# Patient Record
Sex: Male | Born: 2000
Health system: Southern US, Community
[De-identification: ages and names within clinical notes are randomized; demographics above are authoritative.]

## PROBLEM LIST (undated history)

## (undated) DIAGNOSIS — T7840XA Allergy, unspecified, initial encounter: Secondary | ICD-10-CM

## (undated) DIAGNOSIS — F909 Attention-deficit hyperactivity disorder, unspecified type: Secondary | ICD-10-CM

## (undated) HISTORY — PX: TONSILLECTOMY AND ADENOIDECTOMY: SUR1326

## (undated) HISTORY — DX: Attention-deficit hyperactivity disorder, unspecified type: F90.9

## (undated) HISTORY — DX: Allergy, unspecified, initial encounter: T78.40XA

---
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
## 2020-01-02 NOTE — Telephone Encounter (Signed)
Patient has not been seen by me since July 2020. Please get him scheduled for follow-up. I will not refill until he follows up.

## 2020-02-12 NOTE — Telephone Encounter (Signed)
I scheduled patient for a virtual visit for his adderall next week.  Naamah Boggess,cma

## 2020-02-16 ENCOUNTER — Telehealth: Payer: Self-pay | Admitting: Family Medicine

## 2020-02-16 DIAGNOSIS — Z0289 Encounter for other administrative examinations: Secondary | ICD-10-CM

## 2020-05-09 ENCOUNTER — Encounter: Payer: Self-pay | Admitting: Family Medicine

## 2020-05-09 ENCOUNTER — Telehealth: Payer: Self-pay | Admitting: Family Medicine

## 2020-07-11 ENCOUNTER — Encounter: Payer: Self-pay | Admitting: Family Medicine

## 2020-07-11 DIAGNOSIS — F419 Anxiety disorder, unspecified: Secondary | ICD-10-CM

## 2020-07-22 ENCOUNTER — Other Ambulatory Visit: Payer: Self-pay | Admitting: Family Medicine

## 2020-07-22 ENCOUNTER — Other Ambulatory Visit: Payer: Self-pay

## 2020-07-22 ENCOUNTER — Ambulatory Visit (INDEPENDENT_AMBULATORY_CARE_PROVIDER_SITE_OTHER): Payer: No Typology Code available for payment source | Admitting: Family Medicine

## 2020-07-22 ENCOUNTER — Encounter: Payer: Self-pay | Admitting: Family Medicine

## 2020-07-22 DIAGNOSIS — F419 Anxiety disorder, unspecified: Secondary | ICD-10-CM | POA: Diagnosis not present

## 2020-07-22 DIAGNOSIS — F329 Major depressive disorder, single episode, unspecified: Secondary | ICD-10-CM

## 2020-07-22 DIAGNOSIS — F988 Other specified behavioral and emotional disorders with onset usually occurring in childhood and adolescence: Secondary | ICD-10-CM | POA: Diagnosis not present

## 2020-07-22 DIAGNOSIS — F32A Depression, unspecified: Secondary | ICD-10-CM

## 2020-07-22 MED ORDER — HYDROXYZINE HCL 10 MG PO TABS: 10.0000 mg | ORAL_TABLET | Freq: Two times a day (BID) | ORAL | 0 refills | Status: DC | PRN

## 2020-07-22 MED ORDER — ESCITALOPRAM OXALATE 10 MG PO TABS: 10.0000 mg | ORAL_TABLET | Freq: Every day | ORAL | 1 refills | Status: DC

## 2020-07-22 NOTE — Progress Notes (Addendum)
Marikay Alar, MD Phone: [phone]  Christian Bond is a 19 y.o. male who presents today for f/u.  Anxiety/depression: Patient notes his anxiety has worsened recently.  Last year this was triggered by going off to school and notes school seemed to trigger it to a certain degree this year though he feels as though it is now where it is just part of his life.  He is currently not on any medications for this though was seeing a psychiatrist previously who had increased his Lexapro to 20 mg.  He did taper off of that earlier this year.  He does note some depression that has been going on intermittently for the last couple of years.  He notes no suicidal ideation though he does think about cutting and does oftentimes bite his lip.  He notes he has not cut himself and notes he does not think he actually would.  He does note some panic attacks where he feels like his heart speeds up when he feels paranoid and finds it harder to breathe.  These can last anywhere from minutes to 30 minutes.  They just stop on their own typically.  He was previously using Atarax which was somewhat beneficial for those panic attacks.  He notes he has stopped using marijuana as that was making the paranoia worse.  No alcohol intake.  He is not sexually active.  He does not exercise.  ADHD: Adderall was previously beneficial.  He wants to hold off on starting back on this until after he goes back on the Lexapro.  Social History   Tobacco Use  Smoking Status Never Smoker  Smokeless Tobacco Never Used     ROS see history of present illness  Objective  Physical Exam Vitals:   07/22/20 1533  BP: 100/60  Pulse: 85  Temp: 99.2 F (37.3 C)  SpO2: 98%    BP Readings from Last 3 Encounters:  07/22/20 100/60  11/20/18 (!) 98/60 (2 %, Z = -2.02 /  16 %, Z = -0.98)*  08/22/18 (!) 98/62 (3 %, Z = -1.92 /  23 %, Z = -0.74)*   *BP percentiles are based on the 2017 AAP Clinical Practice Guideline for boys   Wt  Readings from Last 3 Encounters:  07/22/20 134 lb 12.8 oz (61.1 kg) (19 %, Z= -0.86)*  06/22/19 130 lb (59 kg) (18 %, Z= -0.91)*  11/20/18 130 lb 9.6 oz (59.2 kg) (23 %, Z= -0.73)*   * Growth percentiles are based on CDC (Boys, 2-20 Years) data.    Physical Exam Constitutional:      General: He is not in acute distress.    Appearance: He is not diaphoretic.  Cardiovascular:     Rate and Rhythm: Normal rate and regular rhythm.     Heart sounds: Normal heart sounds.  Pulmonary:     Effort: Pulmonary effort is normal.     Breath sounds: Normal breath sounds.  Skin:    General: Skin is warm and dry.  Neurological:     Mental Status: He is alert.  Psychiatric:     Comments: Mood anxious, depressed, affect flat, does not keep eye contact consistently, converses well      Assessment/Plan: Please see individual problem list.  Anxiety and depression Seems to have worsened recently.  Has anxiety and depression.  It appears that the psychiatry office is try to contact him.  He was given the number to call to schedule an appointment.  We will restart Lexapro.  He can use Atarax as needed.  Discussed the biting of his lip and the thoughts of cutting are not uncommon actions to deal with now he is feeling though there are healthier ways to deal with this with medication and therapy.  We will make sure he is evaluated by psychiatry.  Advised that if he ever develop suicidal thoughts he needs to be evaluated in the emergency department right away.  ADD (attention deficit disorder) Patient defers restarting treatment at this time as we start him back on Lexapro and Atarax.  He can let us know if you would like to start back on Adderall at some point in the future.   Health Maintenance: patient defers HIV and hepatitis C screening. Defers flu vaccine today.   No orders of the defined types were placed in this encounter.   Meds ordered this encounter  Medications   escitalopram (LEXAPRO) 10  MG tablet    Sig: Take 1 tablet (10 mg total) by mouth daily.    Dispense:  90 tablet    Refill:  1   hydrOXYzine (ATARAX/VISTARIL) 10 MG tablet    Sig: Take 1 tablet (10 mg total) by mouth 2 (two) times daily as needed for anxiety.    Dispense:  20 tablet    Refill:  0    This visit occurred during the SARS-CoV-2 public health emergency.  Safety protocols were in place, including screening questions prior to the visit, additional usage of staff PPE, and extensive cleaning of exam room while observing appropriate contact time as indicated for disinfecting solutions.    Marikay Alar, MD [facility]

## 2020-07-22 NOTE — Patient Instructions (Addendum)
Nice to see you. We will restart Lexapro.  You can restart Atarax on an as-needed basis as well.  Please be careful as this may make you drowsy. It looks like your referral was sent to the psychiatrist office.  They may have mailed you paperwork to complete.  Hear is the number to call them to work on scheduling an appointment. [phone] If you develop thoughts of suicide or plan or intent to commit suicide please seek medical attention in the emergency department immediately.

## 2020-07-22 NOTE — Assessment & Plan Note (Addendum)
Seems to have worsened recently.  Has anxiety and depression.  It appears that the psychiatry office is try to contact him.  He was given the number to call to schedule an appointment.  We will restart Lexapro.  He can use Atarax as needed.  Discussed the biting of his lip and the thoughts of cutting are not uncommon actions to deal with now he is feeling though there are healthier ways to deal with this with medication and therapy.  We will make sure he is evaluated by psychiatry.  Advised that if he ever develop suicidal thoughts he needs to be evaluated in the emergency department right away.

## 2020-07-22 NOTE — Assessment & Plan Note (Signed)
Patient defers restarting treatment at this time as we start him back on Lexapro and Atarax.  He can let us know if you would like to start back on Adderall at some point in the future.

## 2020-07-28 ENCOUNTER — Encounter: Payer: Self-pay | Admitting: Family Medicine

## 2020-08-25 ENCOUNTER — Other Ambulatory Visit: Payer: Self-pay

## 2020-08-25 ENCOUNTER — Ambulatory Visit (INDEPENDENT_AMBULATORY_CARE_PROVIDER_SITE_OTHER): Payer: No Typology Code available for payment source | Admitting: Child and Adolescent Psychiatry

## 2020-08-25 ENCOUNTER — Encounter: Payer: Self-pay | Admitting: Child and Adolescent Psychiatry

## 2020-08-25 ENCOUNTER — Other Ambulatory Visit: Payer: Self-pay | Admitting: Child and Adolescent Psychiatry

## 2020-08-25 ENCOUNTER — Telehealth: Payer: Self-pay | Admitting: Child and Adolescent Psychiatry

## 2020-08-25 DIAGNOSIS — F418 Other specified anxiety disorders: Secondary | ICD-10-CM | POA: Diagnosis not present

## 2020-08-25 DIAGNOSIS — F902 Attention-deficit hyperactivity disorder, combined type: Secondary | ICD-10-CM | POA: Diagnosis not present

## 2020-08-25 DIAGNOSIS — F422 Mixed obsessional thoughts and acts: Secondary | ICD-10-CM | POA: Diagnosis not present

## 2020-08-25 DIAGNOSIS — F331 Major depressive disorder, recurrent, moderate: Secondary | ICD-10-CM | POA: Diagnosis not present

## 2020-08-25 DIAGNOSIS — F419 Anxiety disorder, unspecified: Secondary | ICD-10-CM

## 2020-08-25 DIAGNOSIS — F32A Depression, unspecified: Secondary | ICD-10-CM

## 2020-08-25 DIAGNOSIS — F329 Major depressive disorder, single episode, unspecified: Secondary | ICD-10-CM

## 2020-08-25 MED ORDER — HYDROXYZINE HCL 25 MG PO TABS: ORAL_TABLET | ORAL | 0 refills | Status: DC

## 2020-08-25 MED ORDER — SERTRALINE HCL 25 MG PO TABS: 25.0000 mg | ORAL_TABLET | Freq: Every day | ORAL | 0 refills | Status: DC

## 2020-08-25 NOTE — Telephone Encounter (Signed)
  Collateral information from father - Hi Dr. Jerold Coombe. I just wanted to touch base with you as you will be seeing my son Christian Bond tomorrow. I hope he is honest and forthcoming with you, but he is very reserved. His is seeing you for anxiety and panic attacks, but his underlying issues are much deeper. I suspect he has OCD primarily in the form of obsessive thoughts. He went through periods of obsessive hand washing off and on throughout middle school. By the time he got to high school he was constantly distracted, lost in thought, often pacing around his room or the house oblivous to his surroundings. He was treated for ADD, but I now think he was distracted by obsessive thoughts. It's much worse when he is by himself, so he tends to require constant companionship with familiar people. He started college at [employer] last year, but dropped after a month. When he came home he told me he thought he might have schizophrenia, but wouldn't elaborate. I suspect he was interpreting intrusive thoughts as being voices, but i really don't know. He tried going back to school at [employer] in August, but dropped after 3 weeks, apparently due to anxiety and panic attacks. He can also be somewhat paranoid, so he would probably be upset with me for talking to you, but I just want to make sure you're aware that there is more going on than he may volunteer. I fully understand the privacy policies so I don't expect a response. Thank you!

## 2020-08-25 NOTE — Progress Notes (Addendum)
Virtual Visit via Video Note  I connected with Christian Bond on 08/25/20 at 11:00 AM EDT by a video enabled telemedicine application and verified that I am speaking with the correct person using two identifiers.  Location: Patient: home Provider: office   I discussed the limitations of evaluation and management by telemedicine and the availability of in person appointments. The patient expressed understanding and agreed to proceed.  I discussed the assessment and treatment plan with the patient. The patient was provided an opportunity to ask questions and all were answered. The patient agreed with the plan and demonstrated an understanding of the instructions.   The patient was advised to call back or seek an in-person evaluation if the symptoms worsen or if the condition fails to improve as anticipated.  I provided 60 minutes of non-face-to-face time during this encounter.   Darcel SmallingHiren M Greggory Safranek, MD     Psychiatric Initial Adult Assessment   Patient Identification: Christian Bond MRN:  [id number] Date of Evaluation:  08/25/2020 Referral Source: Marikay AlarEric Sonnenberg, M.D. Chief Complaint:   Visit Diagnosis:    ICD-10-CM   1. Moderate episode of recurrent major depressive disorder (HCC)  F33.1 sertraline (ZOLOFT) 25 MG tablet  2. Other specified anxiety disorders  F41.8 sertraline (ZOLOFT) 25 MG tablet  3. Attention deficit hyperactivity disorder (ADHD), combined type  F90.2   4. Mixed obsessional thoughts and acts  F42.2 sertraline (ZOLOFT) 25 MG tablet  5. Anxiety and depression  F41.9 hydrOXYzine (ATARAX/VISTARIL) 25 MG tablet   F32.9     History of Present Illness: This is a 19 year old Caucasian male, domiciled with biological parents and 19 year old brother, was attending [employer] for this fall semester but dropped out about 3 weeks ago,  He has psychiatric history significant of anxiety, OCD, ADHD, depression and 1 previous psychiatric visit to behavioral health urgent care in  2020.  He was seen and evaluated today for telemedicine encounter.  He reports that he has seen psychiatrist before and his primary care physician recently started him on Lexapro 10 mg for anxiety.  He reports that his anxiety has been worsening, continues to have "compulsive thoughts" and his parents are concerned because he has been staying in his room a lot and sleeping therefore he made this appointment.  During the evaluation he appeared very anxious especially when asked about his anxiety and OCD, he was often distracted and required repetition of question to get a response. After few minutes in the interview he did not want to continue and said that his PCP can manage his medications, however with reiterating the objectives of the appointment he was able to stay in the appointment.   Anxiety -he reports anxiety since middle school.  His anxiety symptoms include excessive worries, or thinking about past and future, catastrophic thinking, panic attacks occurring about every other day and lasting up to 30 minutes, and anxiety in social situations.  He reports that he thinks his anxiety has increased since being on Lexapro.  He reported that he is currently taking Lexapro 20 mg once a day and has been taking it since last over a month.  Depression - He reports that he has intermittent episode of depression that may last of one week.  During these episodes he reports that he has depressed mood, anhedonia, lack of energy, lack of sleep, lack of appetite.  He reports that he has history of suicidal thoughts occurring intermittently irrespective of depressive episodes and are passive in nature.  He denies any previous  suicide attempt.  He reports that he has history of thoughts of plan to act on his suicidal ideation crossing his mind but never had any deliberate plan or intention to act on these thoughts.  He reports that in the past he has engaged in self-harm behaviors which he describes as extinguishing  cigarette but on his arm over the span of 2 weeks.  He reports that his last suicidal thoughts was about 2 weeks ago and denies any current suicidal thoughts, intention or plan.  He reports history of depression over the last few years.  OCD -he reports that he has compulsive rituals which includes counting by seconds, checking rituals, and washing rituals.  He also reports that he has ego-dystonic and intrusive self-harm thoughts.  He reports history of OCD since the middle school.   ADHD -he reports that he has been diagnosed with ADHD and previously taken stimulant medications.  According to chart review he was last taking Adderall XR.  He reports that he has problems with paying attention, gets easily distracted and Adderall was helpful however when he decided to go back on anxiety medications he decided to hold off on ADHD medications.  Paranoia - He reports that he gets paranoid when he goes outside. He reports that often he does not believe that others are out there to get him and describes it as a "general fear" but sometimes these feelings are stronger. He denies any AVH and did not admit other delusions.   He denies symptoms consistent with manic or hypomanic episodes currently or in the past.    Past Psychiatric History:   Inpatient: None RTC: None Outpatient: Previously seen Dr. Jannifer Franklin    - Meds: Lexapro, states "I don't know if it is working and wants to switch to Zoloft or Buspar. He denies other medications trials, however chart review suggested he tried zoloft up to 50 mg daily. Also has hx of trialing Ritalin and Adderall for ADHD. Uses Atarax PRN for anxiety.   He reports that on Lexapro he is more impulsive  - Therapy: In the past about a year ago, none recently.   Previous Psychotropic Medications: Yes   Substance Abuse History in the last 12 months:  Yes.  Hx of MJA abuse.   Consequences of Substance Abuse: NA  Past Medical History:  Past Medical History:   Diagnosis Date  . ADHD (attention deficit hyperactivity disorder)   . Allergy     Past Surgical History:  Procedure Laterality Date  . TONSILLECTOMY AND ADENOIDECTOMY      Family Psychiatric History:   Maternal GM - Mental health problems but he is not sure of it.    Family History:  Family History  Problem Relation Age of Onset  . Colon polyps Mother   . Stroke Maternal Uncle   . Heart disease Maternal Grandfather   . Hyperlipidemia Maternal Grandfather   . Stroke Maternal Grandfather     Social History:   Social History   Socioeconomic History  . Marital status: Single    Spouse name: Not on file  . Number of children: Not on file  . Years of education: Not on file  . Highest education level: Not on file  Occupational History  . Not on file  Tobacco Use  . Smoking status: Never Smoker  . Smokeless tobacco: Never Used  Vaping Use  . Vaping Use: Some days  Substance and Sexual Activity  . Alcohol use: No  . Drug use: No  .  Sexual activity: Not on file  Other Topics Concern  . Not on file  Social History Narrative  . Not on file   Social Determinants of Health   Financial Resource Strain:   . Difficulty of Paying Living Expenses: Not on file  Food Insecurity:   . Worried About Programme researcher, broadcasting/film/video in the Last Year: Not on file  . Ran Out of Food in the Last Year: Not on file  Transportation Needs:   . Lack of Transportation (Medical): Not on file  . Lack of Transportation (Non-Medical): Not on file  Physical Activity:   . Days of Exercise per Week: Not on file  . Minutes of Exercise per Session: Not on file  Stress:   . Feeling of Stress : Not on file  Social Connections:   . Frequency of Communication with Friends and Family: Not on file  . Frequency of Social Gatherings with Friends and Family: Not on file  . Attends Religious Services: Not on file  . Active Member of Clubs or Organizations: Not on file  . Attends Banker Meetings:  Not on file  . Marital Status: Not on file    Additional Social History:   Patient is currently domiciled with biological parents, 53 year old brother.  He completed his high school and went to [employer] last year and dropped out after 4 weeks of attending college.  He started [employer] this fall semester however dropped out after 3 weeks because of anxiety.  His hobbies include watching TV, listening to music, pets(has 2 parakeets in his home)     Allergies:   Allergies  Allergen Reactions  . Amoxicillin Rash    Erythema multiforma  . Penicillins Other (See Comments)    Blisters.    Metabolic Disorder Labs: No results found for: HGBA1C, MPG No results found for: PROLACTIN No results found for: CHOL, TRIG, HDL, CHOLHDL, VLDL, LDLCALC No results found for: TSH  Therapeutic Level Labs: No results found for: LITHIUM No results found for: CBMZ No results found for: VALPROATE  Current Medications: Current Outpatient Medications  Medication Sig Dispense Refill  . fexofenadine (ALLEGRA) 180 MG tablet Take 180 mg by mouth daily.    . hydrOXYzine (ATARAX/VISTARIL) 25 MG tablet Take 0.5-1 tablets(12.5-25 mg total) by mouthe 3(three) times daily as needed for anxiety, and 1 tablet(25 mg total) at bedtime as needed for sleeping difficulties. 30 tablet 0  . [START ON 08/30/2020] sertraline (ZOLOFT) 25 MG tablet Take 1 tablet (25 mg total) by mouth daily. 30 tablet 0   No current facility-administered medications for this visit.    Musculoskeletal: Strength & Muscle Tone: unable to assess since visit was over the telemedicine. Gait & Station: unable to assess since visit was over the telemedicine. Patient leans: N/A  Psychiatric Specialty Exam: Review of Systems  There were no vitals taken for this visit.There is no height or weight on file to calculate BMI.  General Appearance: Casual, Fairly Groomed and shaved head and wearing cap.   Eye Contact:  Poor  Speech:   Clear and Coherent and Normal Rate  Volume:  Normal  Mood:  Anxious  Affect:  Appropriate, Congruent, Restricted and anxious  Thought Process:  Goal Directed and Linear  Orientation:  Full (Time, Place, and Person)  Thought Content:  Logical  Suicidal Thoughts:  No  Homicidal Thoughts:  No  Memory:  Immediate;   Fair Recent;   Fair Remote;   Fair  Judgement:  Fair  Insight:  Shallow  Psychomotor Activity:  Normal  Concentration:  Concentration: Fair and Attention Span: Fair  Recall:  [employer] of Knowledge:Fair  Language: Fair  Akathisia:  No    AIMS (if indicated):  not done  Assets:  Communication Skills Desire for Improvement Financial Resources/Insurance Housing Leisure Time Physical Health Social Support Transportation Vocational/Educational  ADL's:  Intact  Cognition: WNL  Sleep:  Fair   Screenings: PHQ2-9     Office Visit from 07/22/2020 in [facility] [place] Office Visit from 06/22/2019 in [facility]   PHQ-2 Total Score 0 0      Assessment and Plan:   19 year old male with prior psychiatric history Anxiety, OCD, Depression, ADHD now presenting to establish outpatient psychiatric care due to lack of improvement/worsening of anxiety, OCD, Depression and ADHD. His reports of symptoms is most likely consistent with Generalized and Social Anxiety disorder with panic attack, OCD, Recurrent MDD and ADHD. He does report paranoid ideations, this does not appear delusions and he denies other psychotic symptoms. He also denies symptoms consistent with manic/hypomanic episode. Anxiety and OCD appears to be at core of his presentation which appear to impact his daily functioning and resulting in recurrent depression. He has trialed Lexapro upto 20 mg in the past with no improvement and Zoloft upto 50 mg daily. I recommended cross taper from Lexapro to Zoloft since he only tried zoloft upto 50 mg daily and may need higher dose for the appropriate  response. He reported that he is taking LExapro 20 mg daily, however further chart review after appointment revealed he is prescribed Lexapro 10 mg daily by his PCP. He was nonetheless asked to cut his current lexapro tablet in half for 5 days and 1/4 for another five days before stopping it and start Zoloft at 25 mg daily in 5 days from now. I anticipate that he will require higher dose of Zoloft and quick titration up.   Plan:   - Decrease Lexapro to 5 mg daily x 5 days and then 2.5 mg daily for another 5 days and stop.  - Start Zoloft 25 mg daily in 5 days from today.  - Side effects including but not limited to nausea, vomiting, diarrhea, constipation, headaches, dizziness, black box warning of suicidal thoughts with SSRI were discussed with pt and parents. Mother provided informed consent.  - Recommended ind therapy, and made a referral to [facility]. Front desk to call and make an appointment. - Start Atarax 12.5-25 mg TID PRN for anxiety/panic attacks and QHS PRN for sleep   A suicide and violence risk assessment was performed as part of this evaluation. The patient is deemed to be at chronic elevated risk for self-harm/suicide given the following factors: current diagnosis of Recurrent MDD, Anxiety Disorders, OCD, and past hx of suicidal ideations and self harm behaviors. The patient is deemed to be at chronic elevated risk for violence given the following factors: younger age and past hx of MJa use. These risk factors are mitigated by the following factors:lack of active SI/HI, no history of previous suicide attempts , no history of violence, motivation for treatment, utilization of positive coping skills, supportive family, presence of an available support system, enjoyment of leisure actvities, current treatment compliance, safe housing and support system in agreement with treatment recommendations. There is no acute risk for suicide or violence at this time. The patient was educated about relevant  modifiable risk factors including following recommendations for treatment of psychiatric illness and abstaining from substance abuse.  While future psychiatric events cannot be accurately predicted, the patient does not request acute inpatient psychiatric care and does not currently meet [facility] involuntary commitment criteria.    Total time spent of date of service was 75 minutes.  Patient care activities included preparing to see the patient such as reviewing the patient's record, obtaining history from parent, performing a medically appropriate history and mental status examination, counseling and educating the patient, on diagnosis, treatment plan, medications, medications side effects, ordering prescription medications, documenting clinical information in the electronic for other health record, medication side effects. and coordinating the care of the patient when not separately reported.  This note was generated in part or whole with voice recognition software. Voice recognition is usually quite accurate but there are transcription errors that can and very often do occur. I apologize for any typographical errors that were not detected and corrected.    [name], MD 9/30/20217:13 PM

## 2020-08-29 ENCOUNTER — Encounter: Payer: Self-pay | Admitting: Family Medicine

## 2020-09-02 ENCOUNTER — Other Ambulatory Visit: Payer: Self-pay

## 2020-09-02 ENCOUNTER — Ambulatory Visit (INDEPENDENT_AMBULATORY_CARE_PROVIDER_SITE_OTHER): Payer: No Typology Code available for payment source | Admitting: Family Medicine

## 2020-09-02 ENCOUNTER — Encounter: Payer: Self-pay | Admitting: Family Medicine

## 2020-09-02 DIAGNOSIS — F418 Other specified anxiety disorders: Secondary | ICD-10-CM

## 2020-09-02 NOTE — Progress Notes (Signed)
  Marikay Alar, MD Phone: [phone]  Christian Bond is a 19 y.o. male who presents today for f/u.  Anxiety/depression/OCD: Patient saw psychiatry recently.  They diagnosed him with anxiety, MDD, OCD, and noted ADHD as well.  They tapered him off of the Lexapro as the patient felt like this was making his anxiety worse.  They started him on Zoloft 25 mg cross tapering with Lexapro.  He notes already feeling some benefit.  He feels less like he needs to pace around.  Depression seems to have improved some as well.  He does report psychiatry follow-up scheduled.  He notes he has deferred doing therapy at this time.  He denies SI.  Social History   Tobacco Use  Smoking Status Never Smoker  Smokeless Tobacco Never Used     ROS see history of present illness  Objective  Physical Exam Vitals:   09/02/20 1123  BP: 90/60  Pulse: 88  Temp: 98.6 F (37 C)  SpO2: 98%    BP Readings from Last 3 Encounters:  09/02/20 90/60  07/22/20 100/60  11/20/18 (!) 98/60 (2 %, Z = -2.02 /  16 %, Z = -0.98)*   *BP percentiles are based on the 2017 AAP Clinical Practice Guideline for boys   Wt Readings from Last 3 Encounters:  09/02/20 131 lb 9.6 oz (59.7 kg) (15 %, Z= -1.05)*  07/22/20 134 lb 12.8 oz (61.1 kg) (19 %, Z= -0.86)*  06/22/19 130 lb (59 kg) (18 %, Z= -0.91)*   * Growth percentiles are based on CDC (Boys, 2-20 Years) data.    Physical Exam Constitutional:      General: He is not in acute distress.    Appearance: He is not diaphoretic.  Cardiovascular:     Rate and Rhythm: Normal rate and regular rhythm.     Heart sounds: Normal heart sounds.  Pulmonary:     Effort: Pulmonary effort is normal.     Breath sounds: Normal breath sounds.  Skin:    General: Skin is warm and dry.  Neurological:     Mental Status: He is alert.      Assessment/Plan: Please see individual problem list.  Problem List Items Addressed This Visit    Other specified anxiety disorders    Has  reported improving anxiety.  I suspect this is more likely related to tapering off of the Lexapro then it is to starting on the Zoloft as its only been a couple of days.  I discussed that he will likely notice more benefit with the Zoloft with more time.  Discussed it could take several more weeks or a month to notice significant benefit.  He will continue Zoloft 25 mg once daily.  He can continue hydroxyzine as prescribed by psychiatry.  He will follow up with psychiatry as scheduled.  He will see me in 2 months.        Of note patient recently shaved his head.  I encouraged him to wear sunscreen or hat for skin protection.  This visit occurred during the SARS-CoV-2 public health emergency.  Safety protocols were in place, including screening questions prior to the visit, additional usage of staff PPE, and extensive cleaning of exam room while observing appropriate contact time as indicated for disinfecting solutions.    Marikay Alar, MD [facility]

## 2020-09-02 NOTE — Assessment & Plan Note (Signed)
Has reported improving anxiety.  I suspect this is more likely related to tapering off of the Lexapro then it is to starting on the Zoloft as its only been a couple of days.  I discussed that he will likely notice more benefit with the Zoloft with more time.  Discussed it could take several more weeks or a month to notice significant benefit.  He will continue Zoloft 25 mg once daily.  He can continue hydroxyzine as prescribed by psychiatry.  He will follow up with psychiatry as scheduled.  He will see me in 2 months.

## 2020-09-02 NOTE — Patient Instructions (Signed)
Nice to see you. Please continue with the taper off of the Lexapro and continue with your Zoloft. If you have any worsening anxiety or depression symptoms please let us know or your psychiatrist know.  Please keep your psychiatry follow-up appointment.

## 2020-09-09 ENCOUNTER — Other Ambulatory Visit: Payer: Self-pay

## 2020-09-09 ENCOUNTER — Telehealth (INDEPENDENT_AMBULATORY_CARE_PROVIDER_SITE_OTHER): Payer: No Typology Code available for payment source | Admitting: Child and Adolescent Psychiatry

## 2020-09-09 DIAGNOSIS — F422 Mixed obsessional thoughts and acts: Secondary | ICD-10-CM | POA: Diagnosis not present

## 2020-09-09 DIAGNOSIS — F902 Attention-deficit hyperactivity disorder, combined type: Secondary | ICD-10-CM

## 2020-09-09 DIAGNOSIS — F418 Other specified anxiety disorders: Secondary | ICD-10-CM | POA: Diagnosis not present

## 2020-09-09 DIAGNOSIS — F330 Major depressive disorder, recurrent, mild: Secondary | ICD-10-CM

## 2020-09-09 MED ORDER — HYDROXYZINE HCL 25 MG PO TABS: ORAL_TABLET | ORAL | 0 refills | Status: DC

## 2020-09-09 MED ORDER — SERTRALINE HCL 50 MG PO TABS: 50.0000 mg | ORAL_TABLET | Freq: Every day | ORAL | 0 refills | Status: DC

## 2020-09-09 NOTE — Progress Notes (Signed)
Virtual Visit via Video Note  I connected with Christian Bond on 09/09/20 at 10:30 AM EDT by a video enabled telemedicine application and verified that I am speaking with the correct person using two identifiers.  Location: Patient: home Provider: office   I discussed the limitations of evaluation and management by telemedicine and the availability of in person appointments. The patient expressed understanding and agreed to proceed.   I discussed the assessment and treatment plan with the patient. The patient was provided an opportunity to ask questions and all were answered. The patient agreed with the plan and demonstrated an understanding of the instructions.   The patient was advised to call back or seek an in-person evaluation if the symptoms worsen or if the condition fails to improve as anticipated.  I provided 30 minutes of non-face-to-face time during this encounter.   [name], MD    [facility] MD/PA/NP OP Progress Note  09/09/2020 2:07 PM Christian Bond  MRN:  [id number]  Chief Complaint: Medication management follow-up for depression, anxiety, OCD, ADHD HPI: This is a 19 year old Caucasian male, domiciled with biological parents and 19 year old brother, was attending [employer] for this fall semester but dropped out after 3 weeks.  He was seen for initial evaluation about 2 weeks ago for anxiety, OCD, ADHD and depression.  He was recommended to cross taper from Lexapro to Zoloft at last appointment and presents today for follow-up.  During the evaluation he appeared distracted, slightly anxious, often had thought blocking and his attention span was poor.  He reports that he was successfully able to cross taper from Lexapro to Zoloft.  He reports that initially he had some difficulties with paying attention more than his baseline and had brief suicidal thoughts but that has resolved and he is tolerating Zoloft well.  He reports that he also has noticed significant improvement  with his anxiety from 8 out of 10 at the last appointment to 3-4 out of 10 at this appointment(10 = most anxious). He also reports improvement with his Obsessive thoughts and compulsive behaviors. He also notices improvement with his mood, denies any depressive episodes since the last appointment, rates his mood at 7 out of 10 (10 = happiest), enjoys taking care of his parakeets playing video games and hanging out with his friends.  He denies any recent suicidal thoughts or self-harm behaviors.  He denies any homicidal thoughts.  He reports that he has been sleeping better with hydroxyzine and sleeps about 7 hours at night.  He reports that he has been eating well.  Concentration has still been an issue and continues to have low energy. He reports that he uses MJA about twice a week and it sometimes calms and sometimes makes him paranoid. He denies any AVH and did not admit any delusions. He also smokes nicotine occasionally.   We discussed to increase zoloft to 50 mg once a day due to partial improvement with his mood, anxiety and OCD.  He verbalized understanding and agreed with the plan.  We also discussed to try medications for ADHD once his anxiety is more stable.  He was referred to [facility] for therapy however did not pick up the phone when office called him.  He is referred again today.  Recommended to follow-up with therapy.  Visit Diagnosis:    ICD-10-CM   1. Attention deficit hyperactivity disorder (ADHD), combined type  F90.2   2. Other specified anxiety disorders  F41.8 sertraline (ZOLOFT) 50 MG tablet  3.  Mixed obsessional thoughts and acts  F42.2 sertraline (ZOLOFT) 50 MG tablet  4. Mild episode of recurrent major depressive disorder (HCC)  F33.0 hydrOXYzine (ATARAX/VISTARIL) 25 MG tablet    Past Psychiatric History:  As mentioned in initial H&P, reviewed today, no change  Past Medical History:  Past Medical History:  Diagnosis Date   ADHD (attention deficit hyperactivity disorder)     Allergy     Past Surgical History:  Procedure Laterality Date   TONSILLECTOMY AND ADENOIDECTOMY      Family Psychiatric History: As mentioned in initial H&P, reviewed today, no change  Family History:  Family History  Problem Relation Age of Onset   Colon polyps Mother    Stroke Maternal Uncle    Heart disease Maternal Grandfather    Hyperlipidemia Maternal Grandfather    Stroke Maternal Grandfather     Social History:  Social History   Socioeconomic History   Marital status: Single    Spouse name: Not on file   Number of children: Not on file   Years of education: Not on file   Highest education level: Not on file  Occupational History   Not on file  Tobacco Use   Smoking status: Never Smoker   Smokeless tobacco: Never Used  Building services engineer Use: Some days  Substance and Sexual Activity   Alcohol use: No   Drug use: No   Sexual activity: Not on file  Other Topics Concern   Not on file  Social History Narrative   Not on file   Social Determinants of Health   Financial Resource Strain:    Difficulty of Paying Living Expenses: Not on file  Food Insecurity:    Worried About Programme researcher, broadcasting/film/video in the Last Year: Not on file   [employer] of Food in the Last Year: Not on file  Transportation Needs:    Lack of Transportation (Medical): Not on file   Lack of Transportation (Non-Medical): Not on file  Physical Activity:    Days of Exercise per Week: Not on file   Minutes of Exercise per Session: Not on file  Stress:    Feeling of Stress : Not on file  Social Connections:    Frequency of Communication with Friends and Family: Not on file   Frequency of Social Gatherings with Friends and Family: Not on file   Attends Religious Services: Not on file   Active Member of Clubs or Organizations: Not on file   Attends Banker Meetings: Not on file   Marital Status: Not on file    Allergies:  Allergies  Allergen  Reactions   Amoxicillin Rash    Erythema multiforma   Penicillins Other (See Comments)    Blisters.    Metabolic Disorder Labs: No results found for: HGBA1C, MPG No results found for: PROLACTIN No results found for: CHOL, TRIG, HDL, CHOLHDL, VLDL, LDLCALC No results found for: TSH  Therapeutic Level Labs: No results found for: LITHIUM No results found for: VALPROATE No components found for:  CBMZ  Current Medications: Current Outpatient Medications  Medication Sig Dispense Refill   fexofenadine (ALLEGRA) 180 MG tablet Take 180 mg by mouth daily.     hydrOXYzine (ATARAX/VISTARIL) 25 MG tablet Take 0.5-1 tablets(12.5-25 mg total) by mouthe 3(three) times daily as needed for anxiety, and 1 tablet(25 mg total) at bedtime as needed for sleeping difficulties. 30 tablet 0   sertraline (ZOLOFT) 50 MG tablet Take 1 tablet (50 mg total) by mouth  daily. 30 tablet 0   No current facility-administered medications for this visit.     Musculoskeletal: Strength & Muscle Tone: unable to assess since visit was over the telemedicine. Gait & Station: unable to assess since visit was over the telemedicine. Patient leans: N/A  Psychiatric Specialty Exam: Review of Systems  There were no vitals taken for this visit.There is no height or weight on file to calculate BMI.  General Appearance: Casual and Fairly Groomed  Eye Contact:  Fair  Speech:  Blocked  Volume:  Normal  Mood:  "good"  Affect:  Appropriate, Non-Congruent and Constricted  Thought Process:  Thought blocking  Orientation:  Full (Time, Place, and Person)  Thought Content: Logical   Suicidal Thoughts:  No  Homicidal Thoughts:  No  Memory:  Immediate;   Fair Recent;   Fair Remote;   Fair  Judgement:  Fair  Insight:  Fair  Psychomotor Activity:  Normal  Concentration:  Concentration: Poor and Attention Span: Poor  Recall:  [employer] of Knowledge: Fair  Language: Fair      AIMS (if indicated): not done  Assets:   Manufacturing systems engineer Desire for Improvement Financial Resources/Insurance Housing Leisure Time Physical Health Social Support Transportation Vocational/Educational  ADL's:  Intact  Cognition: WNL  Sleep:  Fair   Screenings: PHQ2-9     Office Visit from 07/22/2020 in [facility] [place] Office Visit from 06/22/2019 in [facility] [place]  PHQ-2 Total Score 0 0       Assessment and Plan:   19 year old male with most likely dx of Generalized and Social Anxiety disorders, OCD, Depression, ADHD presented for follow up after initial appointment. He was started on zoloft and recommended to taper down and discontinue Lexapro. He appears to be responding well to Zoloft and has partial improvement in anxiety, mood and OCD. He is recommended to increase the dose of Zoloft to 50 mg daily. Will consider med management for ADHD once his anxiety is stable as he continues to appear distracted, has poor attention which most likely explains his thought blocking in the absence other mood and psychotic symptoms.     Plan:   - Increase Zoloft to 50 mg daily   - Side effects including but not limited to nausea, vomiting, diarrhea, constipation, headaches, dizziness, black box warning of suicidal thoughts with SSRI were discussed with pt and parents. Mother provided informed consent.  - Recommended ind therapy, and made a referral to [facility]. Front desk to call and make an appointment. - Continue Atarax 12.5-25 mg TID PRN for anxiety/panic attacks and QHS PRN for sleep  - He provided written ROI and verbal consent to speak with parents if needed. Called father and left VM for him.   This note was generated in part or whole with voice recognition software. Voice recognition is usually quite accurate but there are transcription errors that can and very often do occur. I apologize for any typographical errors that were not detected and corrected.      [name],  MD 09/09/2020, 2:07 PM

## 2020-09-12 ENCOUNTER — Telehealth: Payer: Self-pay

## 2020-09-12 ENCOUNTER — Other Ambulatory Visit: Payer: Self-pay | Admitting: Child and Adolescent Psychiatry

## 2020-09-12 MED ORDER — ESCITALOPRAM OXALATE 5 MG PO TABS: 5.0000 mg | ORAL_TABLET | Freq: Every day | ORAL | 0 refills | Status: DC

## 2020-09-12 MED ORDER — BUSPIRONE HCL 5 MG PO TABS: 5.0000 mg | ORAL_TABLET | Freq: Two times a day (BID) | ORAL | 0 refills | Status: DC

## 2020-09-12 NOTE — Telephone Encounter (Signed)
I spoke with pt and his mother both. Christian Bond reports that over the weekend he has noticed worsening of suicidal thoughts which he reports are random and intermittent without intent or plan to act on them and started prior to increasing the dose of Zoloft to 50 mg once daily last night. He also reports that he has used THC over the weekend, and also has been having trouble sleeping and would like to go back on Lexapro. We discussed pros and cons of switching back to Lexapro. We mutually decided to stop zoloft and start Lexapro and also add Buspar 5 mg BID for anxiety. His mother is also recommended to look for other therapist in community who can see him on frequent and weekly bases. She verbalized understanding. She was provided names of the resources for therapy in the area.

## 2020-09-12 NOTE — Telephone Encounter (Signed)
increase bad thoughts and wanted to know if he can go back on the lexapro [phone] or call his mother at  [phone]

## 2020-09-13 ENCOUNTER — Telehealth: Payer: Self-pay | Admitting: Psychiatry

## 2020-09-21 ENCOUNTER — Encounter: Payer: Self-pay | Admitting: Licensed Clinical Social Worker

## 2020-09-21 ENCOUNTER — Other Ambulatory Visit: Payer: Self-pay

## 2020-09-21 ENCOUNTER — Ambulatory Visit (INDEPENDENT_AMBULATORY_CARE_PROVIDER_SITE_OTHER): Payer: No Typology Code available for payment source | Admitting: Licensed Clinical Social Worker

## 2020-09-21 DIAGNOSIS — F902 Attention-deficit hyperactivity disorder, combined type: Secondary | ICD-10-CM

## 2020-09-21 DIAGNOSIS — F418 Other specified anxiety disorders: Secondary | ICD-10-CM | POA: Diagnosis not present

## 2020-09-21 DIAGNOSIS — F422 Mixed obsessional thoughts and acts: Secondary | ICD-10-CM

## 2020-09-21 DIAGNOSIS — F330 Major depressive disorder, recurrent, mild: Secondary | ICD-10-CM | POA: Diagnosis not present

## 2020-09-21 DIAGNOSIS — F1910 Other psychoactive substance abuse, uncomplicated: Secondary | ICD-10-CM

## 2020-09-21 NOTE — Progress Notes (Signed)
Virtual Visit via Video Note  I connected with Christian Bond on 09/21/20 at  2:00 PM EDT by a video enabled telemedicine application and verified that I am speaking with the correct person using two identifiers.  Location: Patient: Home Provider: Home Office   I discussed the limitations of evaluation and management by telemedicine and the availability of in person appointments. The patient expressed understanding and agreed to proceed.  Comprehensive Clinical Assessment (CCA) Note  09/21/2020 Christian Bond [id number]  Visit Diagnosis:      ICD-10-CM   1. Attention deficit hyperactivity disorder (ADHD), combined type  F90.2   2. Other specified anxiety disorders  F41.8   3. Mixed obsessional thoughts and acts  F42.2   4. Mild episode of recurrent major depressive disorder (HCC)  F33.0   5. Polysubstance abuse (HCC)  F19.10       CCA Screening, Triage and Referral (STR) STR has been completed on paper by the patient/patient's guardian.  (See scanned document in Chart Review)  CCA Biopsychosocial  Intake/Chief Complaint:  CCA Intake With Chief Complaint CCA Part Two Date: 09/21/20 CCA Part Two Time: 1400 Chief Complaint/Presenting Problem: Pt presents as a 19 year old Caucasian single male for assessment. Pt was referred by his psychiatrist and is seeking counseling for depression and anxiety. Pt had difficulty answering open-ended questions AEB taking very long pauses and not elaborating on any of his responses. Pt appeared to do better with closed ended/yes-or-no type questions. Pt reported that his symptoms made it difficult to keep up with assignments at school and had stopped attending classes. Pt reported no current SI, however had thoughts in the past and tried to harm himself about one year ago. Pt denied previous hospitalization. Patient's Currently Reported Symptoms/Problems: Depression, Anxiety, Obsessions/Compulsions around hand washing and fear of germs, Stopped  attending classes in school Individual's Strengths: Pt reported "I think the medication is starting to kick in after about one week or two" and is somewhat helpful. Individual's Preferences: Pt reported he had some therapy/counseling when he first began high school. Individual's Abilities: Pt reported he has been looking at application for jobs. Type of Services Patient Feels Are Needed: Individual Therapy and Medication Management  Mental Health Symptoms Depression:  Depression: Difficulty Concentrating, Change in energy/activity, Fatigue, Sleep (too much or little), Worthlessness, Hopelessness, Duration of symptoms greater than two weeks  Mania:  Mania: Increased Energy, Overconfidence, Racing thoughts, Recklessness (Pt answered yes, but did not elaborate.)  Anxiety:   Anxiety: Difficulty concentrating, Fatigue, Sleep, Worrying, Tension, Restlessness  Psychosis:  Psychosis: None  Trauma:  Trauma: None  Obsessions:  Obsessions: Cause anxiety (fears of getting germs)  Compulsions:  Compulsions: "Driven" to perform behaviors/acts (hand washing)  Inattention:  Inattention: Forgetful, Loses things, Poor follow-through on tasks, Disorganized, Does not follow instructions (not oppositional), Fails to pay attention/makes careless mistakes, Does not seem to listen, Symptoms present in 2 or more settings  Hyperactivity/Impulsivity:  Hyperactivity/Impulsivity: Fidgets with hands/feet, Feeling of restlessness  Oppositional/Defiant Behaviors:  Oppositional/Defiant Behaviors: None  Emotional Irregularity:  Emotional Irregularity: Potentially harmful impulsivity, Mood lability  Other Mood/Personality Symptoms:  Other Mood/Personality Symptoms: Pt denied current SI, however has self harmed about one year ago.   Mental Status Exam Appearance and self-care  Stature:  Stature: Average  Weight:  Weight: Thin  Clothing:  Clothing: Casual  Grooming:  Grooming: Normal  Cosmetic use:  Cosmetic Use: None   Posture/gait:  Posture/Gait: Normal  Motor activity:  Motor Activity: Not Remarkable  Sensorium  Attention:  Attention: Inattentive  Concentration:  Concentration: Preoccupied  Orientation:  Orientation: X5  Recall/memory:  Recall/Memory: Normal  Affect and Mood  Affect:  Affect: Appropriate  Mood:  Mood: Anxious  Relating  Eye contact:  Eye Contact: Normal  Facial expression:  Facial Expression: Anxious  Attitude toward examiner:  Attitude Toward Examiner: Cooperative  Thought and Language  Speech flow: Speech Flow: Blocked  Thought content:     Preoccupation:  Preoccupations: Phobias, Ruminations  Hallucinations:  Hallucinations: None  Organization:     Company secretary of Knowledge:  Fund of Knowledge: Average  Intelligence:  Intelligence: Average  Abstraction:  Abstraction: Functional  Judgement:  Judgement: Impaired  Reality Testing:  Reality Testing: Adequate  Insight:  Insight: Gaps  Decision Making:  Decision Making: Confused  Social Functioning  Social Maturity:  Social Maturity: Responsible  Social Judgement:  Social Judgement: Normal  Stress  Stressors:  Stressors: Transitions, School  Coping Ability:  Coping Ability: Normal  Skill Deficits:  Skill Deficits: Communication, Intellect/education  Supports:  Supports: Friends/Service system, Family     Religion: Religion/Spirituality Are You A Religious Person?: No  Leisure/Recreation: Leisure / Recreation Do You Have Hobbies?: Yes Leisure and Hobbies: playing video games, taking care of parakeets  Exercise/Diet: Exercise/Diet Do You Exercise?: Yes What Type of Exercise Do You Do?: Run/Walk, Other (Comment) (walk the dog) How Many Times a Week Do You Exercise?: 1-3 times a week Have You Gained or Lost A Significant Amount of Weight in the Past Six Months?: Yes-Lost Number of Pounds Lost?: 10 Do You Follow a Special Diet?: No Do You Have Any Trouble Sleeping?: Yes   CCA  Employment/Education  Employment/Work Situation: Employment / Work Situation Employment situation: Unemployed  Education: Education Is Patient Currently Attending School?: No (Pt stopped attending classes in college.) Did Garment/textile technologist From [employer]?: Yes Did Theme park manager?: Yes What Type of College Degree Do you Have?: Pt was studying music and wanted to obtain a BA. Did You Have An Individualized Education Program (IIEP): No Did You Have Any Difficulty At School?: Yes (Pt reported he couldn't catch up on assignments and stopped attending classes.)   CCA Family/Childhood History  Family and Relationship History: Family history Marital status: Single Are you sexually active?: No Does patient have children?: No  Childhood History:  Childhood History By whom was/is the patient raised?: Both parents Additional childhood history information: Pt lives with his Mom, Dad and brother. Description of patient's relationship with caregiver when they were a child: Pt reported he got along with parents when younger. Patient's description of current relationship with people who raised him/her: Pt reported relationship now is "okay". How were you disciplined when you got in trouble as a child/adolescent?: Pt reported "grounding". Does patient have siblings?: Yes Number of Siblings: 1 Description of patient's current relationship with siblings: Pt reported he has one brother and "we get along well". Did patient suffer any verbal/emotional/physical/sexual abuse as a child?: No Did patient suffer from severe childhood neglect?: No Has patient ever been sexually abused/assaulted/raped as an adolescent or adult?: No Was the patient ever a victim of a crime or a disaster?: No Witnessed domestic violence?: No Has patient been affected by domestic violence as an adult?: No       CCA Substance Use  Alcohol/Drug Use: Alcohol / Drug Use Pain Medications: SEE MAR Prescriptions: SEE  MAR Over the Counter: SEE MAR History of alcohol / drug use?: Yes Negative Consequences of Use: Personal relationships, Financial Withdrawal  Symptoms:  (Pt denied) Substance #1 Name of Substance 1: marijuana 1 - Age of First Use: 15 or 16 1 - Amount (size/oz): 3 hits 1 - Frequency: a couple times per week 1 - Duration: on and off 1 - Last Use / Amount: Pt reported "probably last night - 3 hits". Substance #2 Name of Substance 2: alcohol 2 - Age of First Use: 16 or 17 2 - Amount (size/oz): 5 beers 2 - Frequency: once every couple of months 2 - Duration: on and off 2 - Last Use / Amount: Pt reported "it has been a couple of months - probably like 5 beers".                     ASAM's:  Six Dimensions of Multidimensional Assessment  Dimension 1:  Acute Intoxication and/or Withdrawal Potential:  0    Dimension 2:  Biomedical Conditions and Complications:  1    Dimension 3:  Emotional, Behavioral, or Cognitive Conditions and1Complications:   1  Dimension 4:  Readiness to Change:   2  Dimension 5:  Relapse, Continued use, or Continued Problem Potential:   2  Dimension 6:  Recovery/Living Environment:   1  ASAM Severity Score:    ASAM Recommended Level of Treatment: ASAM Recommended Level of Treatment: Level I Outpatient Treatment (Pt reported that he does not currently desire to stop using substances. Therapist will continue to monitor and educate patient about effects of marijuana and alcohol on sxs.)   Substance use Disorder (SUD) Substance Use Disorder (SUD)  Checklist Symptoms of Substance Use: Persistent desire or unsuccessful efforts to cut down or control use, Presence of craving or strong urge to use, Repeated use in physically hazardous situations, Social, occupational, recreational activities given up or reduced due to use, Continued use despite persistent or recurrent social, interpersonal problems, caused or exacerbated by use  Recommendations for  Services/Supports/Treatments: Recommendations for Services/Supports/Treatments Recommendations For Services/Supports/Treatments: Individual Therapy, Medication Management  DSM5 Diagnoses: Patient Active Problem List   Diagnosis Date Noted  . Mild episode of recurrent major depressive disorder (HCC) 09/09/2020  . Attention deficit hyperactivity disorder (ADHD), combined type 08/25/2020  . Mixed obsessional thoughts and acts 08/25/2020  . Other specified anxiety disorders 05/29/2018  . Acne 02/26/2018  . ADD (attention deficit disorder) 03/06/2016  . Allergic rhinitis 01/28/2016    Patient Centered Plan: Patient is on the following Treatment Plan(s):  Anxiety and Depression   Follow Up Instructions:    I discussed the assessment and treatment plan with the patient. The patient was provided an opportunity to ask questions and all were answered. The patient agreed with the plan and demonstrated an understanding of the instructions.   The patient was advised to call back or seek an in-person evaluation if the symptoms worsen or if the condition fails to improve as anticipated.  I provided 30 minutes of non-face-to-face time during this encounter.   Alajiah Dutkiewicz Arnette Felts, LCSW, LCAS

## 2020-09-26 ENCOUNTER — Telehealth: Payer: Self-pay

## 2020-09-26 NOTE — Telephone Encounter (Signed)
Medication management - message left that patient could be seen in the office by Dr. Jerold Coombe for a face-to-face evaluation on 09/30/20 and reminded if anyone in crisis, they could be taken to the ED for emergency evaluation as needed. Patient or collateral to call back if any needs prior to then.

## 2020-09-26 NOTE — Telephone Encounter (Signed)
Ok thanks 

## 2020-09-26 NOTE — Telephone Encounter (Signed)
Yes I can see him in person but can you please call and check with mother if they have any acute concerns, or if they have any safety concerns than they need to bring him to ER. Thanks

## 2020-09-26 NOTE — Telephone Encounter (Signed)
Medication management - Patient's Mother left a message stating pt is currently "having a real hard time expressing himself" and questions if Dr. Jerold Coombe would be willing to do a face-to-face visit with pt at this time instead of a virtual visit on 11/5?

## 2020-09-30 ENCOUNTER — Other Ambulatory Visit: Payer: Self-pay | Admitting: Child and Adolescent Psychiatry

## 2020-09-30 ENCOUNTER — Encounter: Payer: Self-pay | Admitting: Child and Adolescent Psychiatry

## 2020-09-30 ENCOUNTER — Telehealth (INDEPENDENT_AMBULATORY_CARE_PROVIDER_SITE_OTHER): Payer: No Typology Code available for payment source | Admitting: Child and Adolescent Psychiatry

## 2020-09-30 ENCOUNTER — Other Ambulatory Visit: Payer: Self-pay

## 2020-09-30 DIAGNOSIS — F902 Attention-deficit hyperactivity disorder, combined type: Secondary | ICD-10-CM

## 2020-09-30 DIAGNOSIS — F422 Mixed obsessional thoughts and acts: Secondary | ICD-10-CM

## 2020-09-30 DIAGNOSIS — F418 Other specified anxiety disorders: Secondary | ICD-10-CM

## 2020-09-30 DIAGNOSIS — F331 Major depressive disorder, recurrent, moderate: Secondary | ICD-10-CM

## 2020-09-30 MED ORDER — BUSPIRONE HCL 10 MG PO TABS
10.0000 mg | ORAL_TABLET | Freq: Two times a day (BID) | ORAL | 0 refills | Status: DC
Start: 2020-09-30 — End: 2020-10-18

## 2020-09-30 MED ORDER — HYDROXYZINE HCL 10 MG PO TABS: 10.0000 mg | ORAL_TABLET | Freq: Three times a day (TID) | ORAL | 0 refills | Status: DC | PRN

## 2020-09-30 MED ORDER — ESCITALOPRAM OXALATE 10 MG PO TABS
15.0000 mg | ORAL_TABLET | Freq: Every day | ORAL | 0 refills | Status: DC
Start: 2020-09-30 — End: 2020-10-18

## 2020-09-30 NOTE — Progress Notes (Signed)
BH MD/PA/NP OP Progress Note  09/30/2020 11:57 AM Christian Bond  MRN:  [id number]  Chief Complaint: Medication management follow-up for depression, anxiety, OCD, ADHD.  HPI: This is a 19 year old Caucasian male, domiciled with biological parents and 38 year old brother, was attending [employer] for this fall semester but dropped out after 3 weeks.  He was seen for initial evaluation about 2 weeks ago for anxiety, OCD, ADHD and depression.  In the interim since the last appointment he and his mother called and reported that increasing in Zoloft had caused him having suicidal thoughts and therefore he wanted to go back on Lexapro.  He was started back on Lexapro along with BuSpar.  Today he presented in person with his mother to this clinic for his appointment.  He was evaluated separately and together with his mother with his permission and consent.  During the evaluation he continues to appear anxious, easily distracted, was noted scanning room initially, continues to have intermittent thought blocking(he is able to talk relatively spontaneously when we are not talking about his anxiety or OCD or mood).   He reports that changing to Lexapro from Zoloft and starting BuSpar has been helpful and he has been feeling more calmer since then.  He reports that his anxiety is at 4 out of 10(10 = most anxious), but increases to 10 out of 10 about couple of times a day for which she requires taking hydroxyzine.  He reports generalized and social anxiety.  He reports that he is doing better with his mood, denies any low lows or depressed mood.  His mother however disagrees and reports that he has been not motivated, sleeping most days, has to be reminded of taking care of himself and eat.  Mother reports that whenever he eats he eats well and has been maintaining his weight.  She reports that they have been noticing this change since he came back from [facility].  He denies any suicidal thoughts or self-harm thoughts  or self-harm behaviors.  He denies any AVH, did not admit any delusions except having paranoid ideations.  He denied any thought withdrawal, thought insertion, thought broadcasting.  His mother reports that last year when he came back from [place] he was very irritable, moody and anxious and since then he has triggered however he was doing better during the summer before he went to [facility].  She reports that he was working and hanging out with his friends.  She however reports that patient has history of significant anxiety, attention problems since at least high school.  When asked about his inability to talk spontaneously, mother reports that he has always been quiet however they have been noticing more difficulties in expressing himself for the past 1-1/2 to 2 months.  [name] reports that he has been smoking marijuana about twice a week, uses marijuana pen which has about 1000 mg of marijuana.  He reports that marijuana sometimes Make him paranoid but sometimes also make him calm and anxious and sometimes makes him anxious.  He reports that he does it because of anxiety and sometimes peer pressure.  He was ambivalent about his usage of marijuana and reported that previously he tried to cut down because of various health risks.  Writer discussed this using motivational interviewing techniques and used psychoeducation to talk risks associated with marijuana using his ambivalence.  He was receptive.  We discussed to increase Lexapro to 15 mg once a day and BuSpar to 10 mg 2 times a day  for anxiety and mood.  We discussed to have follow-up in 3 weeks or earlier if needed.  His mother reports that they are also looking for various intensive outpatient therapy options including a place in [place] which provides residential treatment center and is voluntary.  We discussed about [facility] intensive outpatient program-partial hospitalization program and also [facility]  partial hospitalization program.  We discussed to look into this and in the meantime find individual therapist.  They would like to do intensive outpatient therapy in person but at this time everything is virtual and therefore they have not decided whether they will proceed with virtual partial hospitalization or intensive outpatient program.   Visit Diagnosis:    ICD-10-CM   1. Attention deficit hyperactivity disorder (ADHD), combined type  F90.2   2. Moderate episode of recurrent major depressive disorder (HCC)  F33.1   3. Mixed obsessional thoughts and acts  F42.2   4. Other specified anxiety disorders  F41.8     Past Psychiatric History:  As mentioned in initial H&P, reviewed today, no change  Past Medical History:  Past Medical History:  Diagnosis Date  . ADHD (attention deficit hyperactivity disorder)   . Allergy     Past Surgical History:  Procedure Laterality Date  . TONSILLECTOMY AND ADENOIDECTOMY      Family Psychiatric History:   No family history is available from father side of the family because father is adopted Maternal aunt with bipolar disorder/schizophrenia/developmental delays [place] aunt with bipolar disorder and schizophrenia Maternal grandfather with alcoholism No family history of suicide.  Family History:  Family History  Problem Relation Age of Onset  . Colon polyps Mother   . Stroke Maternal Uncle   . Heart disease Maternal Grandfather   . Hyperlipidemia Maternal Grandfather   . Stroke Maternal Grandfather     Social History:  Social History   Socioeconomic History  . Marital status: Single    Spouse name: Not on file  . Number of children: Not on file  . Years of education: Not on file  . Highest education level: Not on file  Occupational History  . Not on file  Tobacco Use  . Smoking status: Never Smoker  . Smokeless tobacco: Never Used  Vaping Use  . Vaping Use: Some days  Substance and Sexual Activity  . Alcohol use: No  .  Drug use: No  . Sexual activity: Not on file  Other Topics Concern  . Not on file  Social History Narrative  . Not on file   Social Determinants of Health   Financial Resource Strain:   . Difficulty of Paying Living Expenses: Not on file  Food Insecurity:   . Worried About Programme researcher, broadcasting/film/video in the Last Year: Not on file  . Ran Out of Food in the Last Year: Not on file  Transportation Needs:   . Lack of Transportation (Medical): Not on file  . Lack of Transportation (Non-Medical): Not on file  Physical Activity:   . Days of Exercise per Week: Not on file  . Minutes of Exercise per Session: Not on file  Stress:   . Feeling of Stress : Not on file  Social Connections:   . Frequency of Communication with Friends and Family: Not on file  . Frequency of Social Gatherings with Friends and Family: Not on file  . Attends Religious Services: Not on file  . Active Member of Clubs or Organizations: Not on file  . Attends Club  or Organization Meetings: Not on file  . Marital Status: Not on file    Allergies:  Allergies  Allergen Reactions  . Amoxicillin Rash    Erythema multiforma  . Penicillins Other (See Comments)    Blisters.    Metabolic Disorder Labs: No results found for: HGBA1C, MPG No results found for: PROLACTIN No results found for: CHOL, TRIG, HDL, CHOLHDL, VLDL, LDLCALC No results found for: TSH  Therapeutic Level Labs: No results found for: LITHIUM No results found for: VALPROATE No components found for:  CBMZ  Current Medications: Current Outpatient Medications  Medication Sig Dispense Refill  . busPIRone (BUSPAR) 5 MG tablet Take 1 tablet (5 mg total) by mouth 2 (two) times daily. 60 tablet 0  . escitalopram (LEXAPRO) 5 MG tablet Take 1 tablet (5 mg total) by mouth daily. 30 tablet 0  . fexofenadine (ALLEGRA) 180 MG tablet Take 180 mg by mouth daily.    . hydrOXYzine (ATARAX/VISTARIL) 25 MG tablet Take 0.5-1 tablets(12.5-25 mg total) by mouthe 3(three)  times daily as needed for anxiety, and 1 tablet(25 mg total) at bedtime as needed for sleeping difficulties. 30 tablet 0   No current facility-administered medications for this visit.     Musculoskeletal: Strength & Muscle Tone: unable to assess since visit was over the telemedicine. Gait & Station: unable to assess since visit was over the telemedicine. Patient leans: N/A  Psychiatric Specialty Exam: Review of Systems  There were no vitals taken for this visit.There is no height or weight on file to calculate BMI.  General Appearance: Casual and Fairly Groomed  Eye Contact:  Fair  Speech:  intermittent blocking  Volume:  Normal  Mood:  "good"  Affect:  Appropriate, Non-Congruent and Constricted  Thought Process:  Thought blocking  Orientation:  Full (Time, Place, and Person)  Thought Content: Logical   Suicidal Thoughts:  No  Homicidal Thoughts:  No  Memory:  Immediate;   Fair Recent;   Fair Remote;   Fair  Judgement:  Fair  Insight:  Fair  Psychomotor Activity:  Normal  Concentration:  Concentration: Poor and Attention Span: Poor  Recall:  [employer] of Knowledge: Fair  Language: Fair      AIMS (if indicated): not done  Assets:  Manufacturing systems engineer Desire for Improvement Financial Resources/Insurance Housing Leisure Time Physical Health Social Support Transportation Vocational/Educational  ADL's:  Intact  Cognition: WNL  Sleep:  Fair   Screenings: PHQ2-9     Office Visit from 07/22/2020 in [facility] Office Visit from 06/22/2019 in [facility]  PHQ-2 Total Score 0 0       Assessment and Plan:   19 year old male with most likely dx of Generalized and Social Anxiety disorders, OCD, Depression, ADHD presented for follow up after initial appointment. He was started on zoloft and recommended to taper down and discontinue Lexapro. He appeared to be responding well to Zoloft however it was discontinued and switched back  to Lexapro because he reported suicidal thoughts on zoloft. On lexapro he reports being more calmer.  He continues to have significant anxiety, thought blocking, depression but reports OCD is better. He is distracted, has poor attention which most likely explains his thought blocking and denies psychotic symptoms except paranoid ideations. Recommending to optimize Lexapro and increase Buspar. Plan as below. .     Plan:   - Increase Lexapro to 15 mg daily   - Side effects including but not limited to nausea, vomiting, diarrhea, constipation, headaches,  dizziness, black box warning of suicidal thoughts with SSRI were discussed with pt and parents. Mother provided informed consent.  - Recommended ind therapy, and started seeing Ms. Judeen Hammans'Reilly.  He is also recommended more intensive outpatient therapy options such as partial hospitalization program and intensive outpatient program and they are looking into this. - Continue Atarax 12.5-25 mg TID PRN for anxiety/panic attacks and QHS PRN for sleep  -Increase BuSpar to 10 mg 3 times a day.   Follow-up in 3 weeks or earlier if needed.   50 minutes total time for encounter today which included chart review, pt evaluation, collaterals, medication and other treatment discussions, counseling, medication orders and charting.     This note was generated in part or whole with voice recognition software. Voice recognition is usually quite accurate but there are transcription errors that can and very often do occur. I apologize for any typographical errors that were not detected and corrected.      Darcel SmallingHiren M Corby Villasenor, MD 09/30/2020, 11:57 AM

## 2020-09-30 NOTE — Patient Instructions (Signed)
-   Please look into [facility] Partial Hospitalization Program/Intensive outpatient program.  - Please look into [email or web address] to find other therapy resources.

## 2020-10-05 ENCOUNTER — Encounter: Payer: Self-pay | Admitting: Licensed Clinical Social Worker

## 2020-10-05 ENCOUNTER — Telehealth: Payer: Self-pay

## 2020-10-05 ENCOUNTER — Ambulatory Visit (INDEPENDENT_AMBULATORY_CARE_PROVIDER_SITE_OTHER): Payer: No Typology Code available for payment source | Admitting: Licensed Clinical Social Worker

## 2020-10-05 ENCOUNTER — Other Ambulatory Visit: Payer: Self-pay

## 2020-10-05 DIAGNOSIS — F902 Attention-deficit hyperactivity disorder, combined type: Secondary | ICD-10-CM

## 2020-10-05 DIAGNOSIS — F418 Other specified anxiety disorders: Secondary | ICD-10-CM

## 2020-10-05 DIAGNOSIS — F422 Mixed obsessional thoughts and acts: Secondary | ICD-10-CM

## 2020-10-05 NOTE — Telephone Encounter (Signed)
pt mother called left message that they need a letter for the insurance so that they can place Ithiel in a faciltiy. please call her to go over details.

## 2020-10-05 NOTE — Telephone Encounter (Signed)
I spoke with mother over the phone. She reports that they are planning to have Dj go to [facility] in [place] and insurance require a letter from this provider recommending Residential treatment for him. Discussed that Clinical research associate will provide a letter and send it to the insurance contact.

## 2020-10-05 NOTE — Progress Notes (Signed)
Virtual Visit via Video Note  I connected with Christian Bond on 10/05/20 at  2:00 PM EST by a video enabled telemedicine application and verified that I am speaking with the correct person using two identifiers.  Location: Patient: Home Provider: Home Office   I discussed the limitations of evaluation and management by telemedicine and the availability of in person appointments. The patient expressed understanding and agreed to proceed.  THERAPY PROGRESS NOTE  Session Time: 20 Minutes  Participation Level: Active  Behavioral Response: Well GroomedAlertAnxious  Type of Therapy: Individual Therapy  Treatment Goals addressed: Anxiety and Coping  Interventions: CBT  Summary: [name] is a 19 y.o. male who presents with Pt reported that he is doing okay at the moment, however confirmed that he has talked with his psychiatrist about needing referral for more intensive therapy. Pt reported that "virtual therapy is kind of harder for me". Pt reported sleep is good, but irregular with waking up several times and mood is about the same since last session. Pt much like last time appeared to have difficulty answering questions with long pauses and difficulty finding words to express emotions. Pt reported triggers for anxiety "can be a lot of things. It is hard to say. I don't exactly know, it just comes" out of the blue. Pt also described having panic attacks w/ last one occurring a few days to one week ago and reported it was "the worst". Pt was receptive to engaging in grounding technique and was able to complete exercise with very little difficulty in observing and describing surroundings using his senses.  Suicidal/Homicidal: Yeswithout intent/plan, Pt reported "sometimes during the day - I think it is related to anxiety".   Therapist Response: Therapist met with patient for first session since completing CCA. Therapist and patient reviewed treatment plan and goals. Pt in agreement.  Therapist and patient explored current triggers for anxiety. Therapist provided psychoeducation on mindfulness skills and engaged patient in self-soothing with the 5 senses. Pt was receptive.  Plan: Patient agreed to return if sxs worsen and/or if he is not admitted to IOP/PHP.  Diagnosis: Axis I: ADHD, combined type, Anxiety Disorder NOS and Mixed obsessional thoughts and acts    Axis II: N/A  Josephine Igo, LCSW, LCAS 10/05/2020

## 2020-10-18 ENCOUNTER — Other Ambulatory Visit: Payer: Self-pay

## 2020-10-18 ENCOUNTER — Other Ambulatory Visit: Payer: Self-pay | Admitting: Child and Adolescent Psychiatry

## 2020-10-18 ENCOUNTER — Telehealth (INDEPENDENT_AMBULATORY_CARE_PROVIDER_SITE_OTHER): Payer: No Typology Code available for payment source | Admitting: Child and Adolescent Psychiatry

## 2020-10-18 DIAGNOSIS — F331 Major depressive disorder, recurrent, moderate: Secondary | ICD-10-CM

## 2020-10-18 DIAGNOSIS — F422 Mixed obsessional thoughts and acts: Secondary | ICD-10-CM | POA: Diagnosis not present

## 2020-10-18 DIAGNOSIS — F902 Attention-deficit hyperactivity disorder, combined type: Secondary | ICD-10-CM

## 2020-10-18 DIAGNOSIS — F418 Other specified anxiety disorders: Secondary | ICD-10-CM | POA: Diagnosis not present

## 2020-10-18 MED ORDER — BUSPIRONE HCL 10 MG PO TABS: 10.0000 mg | ORAL_TABLET | Freq: Two times a day (BID) | ORAL | 0 refills | Status: DC

## 2020-10-18 MED ORDER — HYDROXYZINE HCL 10 MG PO TABS: 10.0000 mg | ORAL_TABLET | Freq: Three times a day (TID) | ORAL | 0 refills | Status: DC | PRN

## 2020-10-18 MED ORDER — HYDROXYZINE HCL 25 MG PO TABS: ORAL_TABLET | ORAL | 0 refills | Status: DC

## 2020-10-18 MED ORDER — ESCITALOPRAM OXALATE 20 MG PO TABS: 20.0000 mg | ORAL_TABLET | Freq: Every day | ORAL | 0 refills | Status: DC

## 2020-10-18 NOTE — Progress Notes (Signed)
Virtual Visit via Video Note  I connected with Christian Bond on 10/18/20 at  9:30 AM EST by a video enabled telemedicine application and verified that I am speaking with the correct person using two identifiers.  Location: Patient: home Provider: office   I discussed the limitations of evaluation and management by telemedicine and the availability of in person appointments. The patient expressed understanding and agreed to proceed.   I discussed the assessment and treatment plan with the patient. The patient was provided an opportunity to ask questions and all were answered. The patient agreed with the plan and demonstrated an understanding of the instructions.   The patient was advised to call back or seek an in-person evaluation if the symptoms worsen or if the condition fails to improve as anticipated.  I provided 40 minutes of non-face-to-face time during this encounter.   Christian Smalling, MD     [facility] MD/PA/NP OP Progress Note  10/18/2020 11:21 AM Christian Bond  MRN:  [id number]  Chief Complaint: Medication management follow-up for depression, anxiety, OCD, ADHD.  HPI:   This is a 19 year old Caucasian male, domiciled with biological parents and 67 year old brother, was attending [employer] for this fall semester but dropped out about after being there for 3 weeks.  He was seen for initial evaluation about 2 months ago and his presentation appears consistent with severe anxiety, depression, OCD and ADHD.  At the last appointment he was recommended to increase Lexapro to 15 mg once a day and BuSpar to 10 mg 2 times a day.  He was present at his home for this appointment today and he gave informed consent to talk with his mother for treatment plan and collateral information.  He was evaluated separately from his mother.  He reports that he has tolerated increased dose of Lexapro well and he has been feeling better with his anxiety, intrusive thoughts are better and his mood is  better.  He reports that he continues to struggle with intermittent severe anxiety.  He reports that his mood is better and reports that it is less sad as compared to last appointment.  He reports that he continues to feel apathetic, continues to have struggles with energy level, has been staying at home but has been feeling more comfortable going out and planning to look for a job this week.  He reports that he continues to have overthinking that makes it difficult for him to go to sleep but hydroxyzine helps him with the sleep.  He reports that he has been eating better but continues to have intermittent periods of low appetite and excessive eating.  He reports that he has suicidal thoughts intermittently without intent or plan and occurs about every other day especially at night in the context of overthinking.  He reports that he does not want to die and it would not be good for him when asked what stops him from acting on these thoughts.  He reports that he has ego-dystonic and intrusive sexual and violent thoughts towards others but it has been more manageable.  He reports that he had an intake appointment with [facility] in [place] for the residential treatment program yesterday and although it went well he decided not to pursue treatment there because he believes that he is doing better.  It also appears that he is anxious of leaving the home and staying for 1 month at the facility.  Writer provided motivational interviewing techniques and encouraged him to rethink of his decision  given his significant struggles with anxiety and depression despite recent improvement.  He verbalized understanding and agreed to rethink on this.  He reports that he continues to use marijuana once every day.  He denies any other substance abuse.  He appears interested in decreasing his substance abuse and therefore we used psychoeducation and supportive counseling to help him abstain from marijuana.  He denies any AVH, has  intermittent paranoia but denies any paranoia recently and did not admit any delusions.  He denies any current SI/HI.  We discussed increasing the dose of Lexapro to 20 mg once a day from 15 mg once a day while continuing his BuSpar and hydroxyzine with plan to titrate up as needed during the follow-up appointments.  His mother provided collateral information and she shared that Christian Bond appears to be doing better and therefore decided to not go to residential treatment center.  She reports that they are still supportive if he changes his mind.  She reports that alternative plan to residential treatment is to go to partial hospitalization program at [facility] in [place].  We discussed the plan to increase Lexapro and potentially increase BuSpar at the next appointment in 2 weeks.  She verbalized understanding.   Visit Diagnosis:    ICD-10-CM   1. Other specified anxiety disorders  F41.8 escitalopram (LEXAPRO) 20 MG tablet    hydrOXYzine (ATARAX/VISTARIL) 10 MG tablet    hydrOXYzine (ATARAX/VISTARIL) 25 MG tablet    busPIRone (BUSPAR) 10 MG tablet  2. Moderate episode of recurrent major depressive disorder (HCC)  F33.1 escitalopram (LEXAPRO) 20 MG tablet    hydrOXYzine (ATARAX/VISTARIL) 25 MG tablet  3. Attention deficit hyperactivity disorder (ADHD), combined type  F90.2   4. Mixed obsessional thoughts and acts  F42.2 escitalopram (LEXAPRO) 20 MG tablet    hydrOXYzine (ATARAX/VISTARIL) 10 MG tablet    Past Psychiatric History:  As mentioned in initial H&P, reviewed today, no change  Past Medical History:  Past Medical History:  Diagnosis Date  . ADHD (attention deficit hyperactivity disorder)   . Allergy     Past Surgical History:  Procedure Laterality Date  . TONSILLECTOMY AND ADENOIDECTOMY      Family Psychiatric History:   No family history is available from father side of the family because father is adopted Maternal aunt with bipolar disorder/schizophrenia/developmental  delays [place] aunt with bipolar disorder and schizophrenia Maternal grandfather with alcoholism No family history of suicide.  Family History:  Family History  Problem Relation Age of Onset  . Colon polyps Mother   . Stroke Maternal Uncle   . Heart disease Maternal Grandfather   . Hyperlipidemia Maternal Grandfather   . Stroke Maternal Grandfather     Social History:  Social History   Socioeconomic History  . Marital status: Single    Spouse name: Not on file  . Number of children: Not on file  . Years of education: Not on file  . Highest education level: Not on file  Occupational History  . Not on file  Tobacco Use  . Smoking status: Never Smoker  . Smokeless tobacco: Never Used  Vaping Use  . Vaping Use: Some days  Substance and Sexual Activity  . Alcohol use: No  . Drug use: No  . Sexual activity: Not on file  Other Topics Concern  . Not on file  Social History Narrative  . Not on file   Social Determinants of Health   Financial Resource Strain:   . Difficulty of Paying Living Expenses: Not on  file  Food Insecurity:   . Worried About Programme researcher, broadcasting/film/video in the Last Year: Not on file  . Ran Out of Food in the Last Year: Not on file  Transportation Needs:   . Lack of Transportation (Medical): Not on file  . Lack of Transportation (Non-Medical): Not on file  Physical Activity:   . Days of Exercise per Week: Not on file  . Minutes of Exercise per Session: Not on file  Stress:   . Feeling of Stress : Not on file  Social Connections:   . Frequency of Communication with Friends and Family: Not on file  . Frequency of Social Gatherings with Friends and Family: Not on file  . Attends Religious Services: Not on file  . Active Member of Clubs or Organizations: Not on file  . Attends Banker Meetings: Not on file  . Marital Status: Not on file    Allergies:  Allergies  Allergen Reactions  . Amoxicillin Rash    Erythema multiforma  . Penicillins  Other (See Comments)    Blisters.    Metabolic Disorder Labs: No results found for: HGBA1C, MPG No results found for: PROLACTIN No results found for: CHOL, TRIG, HDL, CHOLHDL, VLDL, LDLCALC No results found for: TSH  Therapeutic Level Labs: No results found for: LITHIUM No results found for: VALPROATE No components found for:  CBMZ  Current Medications: Current Outpatient Medications  Medication Sig Dispense Refill  . busPIRone (BUSPAR) 10 MG tablet Take 1 tablet (10 mg total) by mouth 2 (two) times daily. 60 tablet 0  . escitalopram (LEXAPRO) 20 MG tablet Take 1 tablet (20 mg total) by mouth daily. 30 tablet 0  . fexofenadine (ALLEGRA) 180 MG tablet Take 180 mg by mouth daily.    . hydrOXYzine (ATARAX/VISTARIL) 10 MG tablet Take 1 tablet (10 mg total) by mouth 3 (three) times daily as needed for anxiety. 90 tablet 0  . hydrOXYzine (ATARAX/VISTARIL) 25 MG tablet Take 0.5-1 tablets(12.5-25 mg total) by mouthe 3(three) times daily as needed for anxiety, and 1 tablet(25 mg total) at bedtime as needed for sleeping difficulties. 30 tablet 0   No current facility-administered medications for this visit.     Musculoskeletal: Strength & Muscle Tone: unable to assess since visit was over the telemedicine. Gait & Station: unable to assess since visit was over the telemedicine. Patient leans: N/A  Psychiatric Specialty Exam: Review of Systems  There were no vitals taken for this visit.There is no height or weight on file to calculate BMI.  General Appearance: Casual and Fairly Groomed  Eye Contact:  Fair  Speech:  intermittent blocking  Volume:  Normal  Mood:  "less sad"  Affect:  Appropriate, Congruent and Constricted  Thought Process:  Thought blocking  Orientation:  Full (Time, Place, and Person)  Thought Content: Logical and Obsessions   Suicidal Thoughts:  No  Homicidal Thoughts:  No  Memory:  Immediate;   Fair Recent;   Fair Remote;   Fair  Judgement:  Fair  Insight:   Fair  Psychomotor Activity:  Normal  Concentration:  Concentration: Poor and Attention Span: Poor  Recall:  [employer] of Knowledge: Fair  Language: Fair      AIMS (if indicated): not done  Assets:  Manufacturing systems engineer Desire for Improvement Financial Resources/Insurance Housing Leisure Time Physical Health Social Support Transportation Vocational/Educational  ADL's:  Intact  Cognition: WNL  Sleep:  Fair   Screenings: PHQ2-9     Office Visit from 07/22/2020  in [facility] Office Visit from 06/22/2019 in [facility]  PHQ-2 Total Score 0 0       Assessment and Plan:   19 year old male with most likely dx of Generalized and Social Anxiety disorders, OCD, Depression, ADHD presented for follow up after initial appointment. He was started on zoloft and recommended to taper down and discontinue Lexapro. He appeared to be responding well to Zoloft however it was discontinued and switched back to Lexapro because he reported suicidal thoughts on zoloft. On lexapro he reports being more calmer and reports improvement in mood, anxiety and OCD. He also appeared to have less thought blocking and was more spontaneous with speech. He decided to pursue residential treatment for his anxiety, OCD and depressed, he was encouraged to reconsider it as he appears to continue to have significant anxiety, moderate depression and OCD despite recent improvement. He will reconsider this and they are alternatively planning [facility] in [place].   He is distracted, has poor attention which most likely explains his thought blocking and denies psychotic symptoms except paranoid ideations. Recommending to optimize Lexapro and continue with Buspar. Plan as below. .     Plan:   - Increase Lexapro to 20 mg daily   - Side effects including but not limited to nausea, vomiting, diarrhea, constipation, headaches, dizziness, black box warning of suicidal thoughts  with SSRI were discussed with pt and parents. Mother provided informed consent.  - Recommended ind therapy, and started seeing Ms. Judeen Hammans'Reilly.  He is also recommended more intensive outpatient therapy options, he is planning to reconsider [facility] and if he decides against it then they will be looking into PHP at [facility] [place].  - Continue Atarax 12.5-25 mg TID PRN for anxiety/panic attacks and QHS PRN for sleep  - Continue BuSpar  10 mg 2 times a day.   Follow-up in 2 weeks or earlier if needed.   45 minutes total time for encounter today which included chart review, pt evaluation, collaterals, medication and other treatment discussions, counseling, medication orders and charting.     This note was generated in part or whole with voice recognition software. Voice recognition is usually quite accurate but there are transcription errors that can and very often do occur. I apologize for any typographical errors that were not detected and corrected.      Christian SmallingHiren M Bernita Beckstrom, MD 10/18/2020, 11:21 AM

## 2020-11-01 ENCOUNTER — Telehealth (INDEPENDENT_AMBULATORY_CARE_PROVIDER_SITE_OTHER): Payer: No Typology Code available for payment source | Admitting: Child and Adolescent Psychiatry

## 2020-11-01 ENCOUNTER — Other Ambulatory Visit: Payer: Self-pay | Admitting: Child and Adolescent Psychiatry

## 2020-11-01 ENCOUNTER — Other Ambulatory Visit: Payer: Self-pay

## 2020-11-01 DIAGNOSIS — F422 Mixed obsessional thoughts and acts: Secondary | ICD-10-CM

## 2020-11-01 DIAGNOSIS — F418 Other specified anxiety disorders: Secondary | ICD-10-CM

## 2020-11-01 DIAGNOSIS — F331 Major depressive disorder, recurrent, moderate: Secondary | ICD-10-CM

## 2020-11-01 MED ORDER — ESCITALOPRAM OXALATE 20 MG PO TABS: 20.0000 mg | ORAL_TABLET | Freq: Every day | ORAL | 0 refills | Status: DC

## 2020-11-01 MED ORDER — BUSPIRONE HCL 10 MG PO TABS: 10.0000 mg | ORAL_TABLET | Freq: Two times a day (BID) | ORAL | 0 refills | Status: DC

## 2020-11-01 MED ORDER — BUPROPION HCL ER (XL) 150 MG PO TB24: 150.0000 mg | ORAL_TABLET | Freq: Every day | ORAL | 0 refills | Status: DC

## 2020-11-01 NOTE — Progress Notes (Signed)
Virtual Visit via Video Note  I connected with Christian Bond on 11/01/20 at 11:00 AM EST by a video enabled telemedicine application and verified that I am speaking with the correct person using two identifiers.  Location: Patient: home Provider: office   I discussed the limitations of evaluation and management by telemedicine and the availability of in person appointments. The patient expressed understanding and agreed to proceed.   I discussed the assessment and treatment plan with the patient. The patient was provided an opportunity to ask questions and all were answered. The patient agreed with the plan and demonstrated an understanding of the instructions.   The patient was advised to call back or seek an in-person evaluation if the symptoms worsen or if the condition fails to improve as anticipated.  I provided 30 minutes of non-face-to-face time during this encounter.   Christian Smalling, MD     [facility] MD/PA/NP OP Progress Note  11/01/2020 5:08 PM Christian Bond  MRN:  [id number]  Chief Complaint: Medication management follow-up for depression, anxiety, OCD, ADHD.  HPI:   This is a 19 year old Caucasian male, domiciled with biological parents and 34 year old brother, was attending [employer] for this fall semester but dropped out about after being there for 3 weeks.  He was seen for initial evaluation about 2 months ago and his presentation appears consistent with severe anxiety, depression, OCD and ADHD.  At the last appointment he was recommended to increase Lexapro to 20 mg once a day and continue with BuSpar to 10 mg 2 times a day.  He was present at his home for today's appointment and gave verbal informed consent to speak with his mother over the phone to obtain collateral information and discuss the treatment plan.    Arturo reports that he tolerated increased dose of Lexapro well however denies any significant improvement since the increase in the dose since last  appointment.  He however reports that his anxiety is less at home and peaks up a little when he goes outside but still manageable.  He rates his anxiety at 2 out of 10(10 = most anxious) when he is at home and 6/10 when he goes outside.  He reports that he continues to have intermittent episodes where he is feeling depressed which occurs a few times a week.  He reports that during these episodes he has also been having suicidal thoughts and plan crosses his mind but he does not have any intention to act on them.  He reports that he has been able to control his thoughts and if he ever does not feel safe then he will definitely come to the hospital, speak with his parents so they can help him get to the hospital.  He also reports that he has been eating slightly better, sleeping slightly better, continues to have difficulties with motivation and has been spending most of the time at home.  He reports that he continues to have obsessive thoughts and compulsive behaviors but they are easier to manage.  He denies any AVH and reports that he has some paranoid ideations.   We discussed that given partial improvement with his anxiety and OCD and ongoing and intermittent episodes of depression writer would recommend adding Wellbutrin XL 150 mg once a day in addition to his current medicines.  Discussed risks and benefits including but not limited to black box warning of suicidal thoughts associated with Wellbutrin.  He verbalized understanding and provided verbal informed consent.  I discussed to continue  Lexapro 20 mg once a day and BuSpar 10 mg 2 times a day.  His mother reports that they have noticed that he has been more relaxed, slightly better engaged, returning to his old self however he has stopped hanging out with his friends, spending most of the time at home and although he has filled out job applications he has not been submitting them.  She reports that they have given him time up until holidays and if he  still struggles then they are going to reconsider [facility] residential treatment center.  We also discussed to look for therapy resources in the interim.  Mother reports that she will call the places in [place] from the list provided to her previously.  There was informed that Christian Bond has provided informed consent to start taking Wellbutrin XL 150 mg once a day.  Visit Diagnosis:    ICD-10-CM   1. Other specified anxiety disorders  F41.8 busPIRone (BUSPAR) 10 MG tablet    escitalopram (LEXAPRO) 20 MG tablet  2. Moderate episode of recurrent major depressive disorder (HCC)  F33.1 escitalopram (LEXAPRO) 20 MG tablet    buPROPion (WELLBUTRIN XL) 150 MG 24 hr tablet  3. Mixed obsessional thoughts and acts  F42.2 escitalopram (LEXAPRO) 20 MG tablet    Past Psychiatric History:  As mentioned in initial H&P, reviewed today, no change  Past Medical History:  Past Medical History:  Diagnosis Date  . ADHD (attention deficit hyperactivity disorder)   . Allergy     Past Surgical History:  Procedure Laterality Date  . TONSILLECTOMY AND ADENOIDECTOMY      Family Psychiatric History:   No family history is available from father side of the family because father is adopted Maternal aunt with bipolar disorder/schizophrenia/developmental delays [place] aunt with bipolar disorder and schizophrenia Maternal grandfather with alcoholism No family history of suicide.  Family History:  Family History  Problem Relation Age of Onset  . Colon polyps Mother   . Stroke Maternal Uncle   . Heart disease Maternal Grandfather   . Hyperlipidemia Maternal Grandfather   . Stroke Maternal Grandfather     Social History:  Social History   Socioeconomic History  . Marital status: Single    Spouse name: Not on file  . Number of children: Not on file  . Years of education: Not on file  . Highest education level: Not on file  Occupational History  . Not on file  Tobacco Use  . Smoking status: Never  Smoker  . Smokeless tobacco: Never Used  Vaping Use  . Vaping Use: Some days  Substance and Sexual Activity  . Alcohol use: No  . Drug use: No  . Sexual activity: Not on file  Other Topics Concern  . Not on file  Social History Narrative  . Not on file   Social Determinants of Health   Financial Resource Strain:   . Difficulty of Paying Living Expenses: Not on file  Food Insecurity:   . Worried About Programme researcher, broadcasting/film/video in the Last Year: Not on file  . Ran Out of Food in the Last Year: Not on file  Transportation Needs:   . Lack of Transportation (Medical): Not on file  . Lack of Transportation (Non-Medical): Not on file  Physical Activity:   . Days of Exercise per Week: Not on file  . Minutes of Exercise per Session: Not on file  Stress:   . Feeling of Stress : Not on file  Social Connections:   . Frequency  of Communication with Friends and Family: Not on file  . Frequency of Social Gatherings with Friends and Family: Not on file  . Attends Religious Services: Not on file  . Active Member of Clubs or Organizations: Not on file  . Attends BankerClub or Organization Meetings: Not on file  . Marital Status: Not on file    Allergies:  Allergies  Allergen Reactions  . Amoxicillin Rash    Erythema multiforma  . Penicillins Other (See Comments)    Blisters.    Metabolic Disorder Labs: No results found for: HGBA1C, MPG No results found for: PROLACTIN No results found for: CHOL, TRIG, HDL, CHOLHDL, VLDL, LDLCALC No results found for: TSH  Therapeutic Level Labs: No results found for: LITHIUM No results found for: VALPROATE No components found for:  CBMZ  Current Medications: Current Outpatient Medications  Medication Sig Dispense Refill  . buPROPion (WELLBUTRIN XL) 150 MG 24 hr tablet Take 1 tablet (150 mg total) by mouth daily. 30 tablet 0  . busPIRone (BUSPAR) 10 MG tablet Take 1 tablet (10 mg total) by mouth 2 (two) times daily. 60 tablet 0  . escitalopram (LEXAPRO)  20 MG tablet Take 1 tablet (20 mg total) by mouth daily. 30 tablet 0  . fexofenadine (ALLEGRA) 180 MG tablet Take 180 mg by mouth daily.    . hydrOXYzine (ATARAX/VISTARIL) 10 MG tablet Take 1 tablet (10 mg total) by mouth 3 (three) times daily as needed for anxiety. 90 tablet 0  . hydrOXYzine (ATARAX/VISTARIL) 25 MG tablet Take 0.5-1 tablets(12.5-25 mg total) by mouthe 3(three) times daily as needed for anxiety, and 1 tablet(25 mg total) at bedtime as needed for sleeping difficulties. 30 tablet 0   No current facility-administered medications for this visit.     Musculoskeletal: Strength & Muscle Tone: unable to assess since visit was over the telemedicine. Gait & Station: unable to assess since visit was over the telemedicine. Patient leans: N/A  Psychiatric Specialty Exam: Review of Systems  There were no vitals taken for this visit.There is no height or weight on file to calculate BMI.  General Appearance: Casual and Fairly Groomed  Eye Contact:  Fair  Speech:  intermittent blocking  Volume:  Normal  Mood:  "better"  Affect:  Appropriate, Congruent and Constricted  Thought Process:  Thought blocking  Orientation:  Full (Time, Place, and Person)  Thought Content: Logical and Obsessions   Suicidal Thoughts:  No  Homicidal Thoughts:  No  Memory:  Immediate;   Fair Recent;   Fair Remote;   Fair  Judgement:  Fair  Insight:  Fair  Psychomotor Activity:  Normal  Concentration:  Concentration: Poor and Attention Span: Poor  Recall:  FiservFair  Fund of Knowledge: Fair  Language: Fair      AIMS (if indicated): not done  Assets:  Manufacturing systems engineerCommunication Skills Desire for Improvement Financial Resources/Insurance Housing Leisure Time Physical Health Social Support Transportation Vocational/Educational  ADL's:  Intact  Cognition: WNL  Sleep:  Fair   Screenings: PHQ2-9     [facility]  [place]  PHQ-2 Total Score 0 0       Assessment and Plan:   19 year old male with most likely dx of Generalized and Social Anxiety disorders, OCD, Depression, ADHD presented for follow up. On lexapro he reports being more calmer and reports improvement in mood, anxiety and OCD and has partial improvement. He considered to pursue residential treatment for his  anxiety, OCD and depressed, but decided against it. He is distracted, has poor attention which most likely explains his thought blocking and denies psychotic symptoms except paranoid ideations. He is recommended to try Wellbutrin XL 150 mg daily for depression in addition to current medications, and start ind therapy.   Plan as below. .     Plan:   - Continue with Lexapro 20 mg daily  t.  - Start Wellbutrin XL 150 mg daily.  - Recommended ind therapy, and started seeing Ms. Judeen Hammans.  He is also recommended more intensive outpatient therapy options, They are planning to see outpatient therapist at this time and were recommended atleast once a week therapy.  - Continue Atarax 12.5-25 mg TID PRN for anxiety/panic attacks and QHS PRN for sleep  - Continue BuSpar  10 mg 2 times a day.   Follow-up in 3-4 weeks or earlier if needed.   40 minutes total time for encounter today which included chart review, pt evaluation, collaterals, medication and other treatment discussions, counseling, medication orders and charting.     This note was generated in part or whole with voice recognition software. Voice recognition is usually quite accurate but there are transcription errors that can and very often do occur. I apologize for any typographical errors that were not detected and corrected.      Christian Smalling, MD 11/01/2020, 5:08 PM

## 2020-11-01 NOTE — Addendum Note (Signed)
Addended by: Lorenso Quarry on: 11/01/2020 05:12 PM   Modules accepted: Level of Service

## 2020-11-04 ENCOUNTER — Telehealth: Payer: Self-pay | Admitting: Family Medicine

## 2020-11-04 ENCOUNTER — Ambulatory Visit: Payer: No Typology Code available for payment source | Admitting: Family Medicine

## 2020-11-04 NOTE — Telephone Encounter (Signed)
Patient's mother want to be called if you can't reach the son for information her number is [phone]

## 2020-11-17 ENCOUNTER — Encounter: Payer: Self-pay | Admitting: Family Medicine

## 2020-11-17 DIAGNOSIS — R5382 Chronic fatigue, unspecified: Secondary | ICD-10-CM

## 2020-11-17 DIAGNOSIS — F5089 Other specified eating disorder: Secondary | ICD-10-CM

## 2020-11-24 ENCOUNTER — Other Ambulatory Visit (INDEPENDENT_AMBULATORY_CARE_PROVIDER_SITE_OTHER): Payer: No Typology Code available for payment source

## 2020-11-24 ENCOUNTER — Other Ambulatory Visit: Payer: Self-pay

## 2020-11-24 DIAGNOSIS — R5382 Chronic fatigue, unspecified: Secondary | ICD-10-CM

## 2020-11-24 DIAGNOSIS — F5089 Other specified eating disorder: Secondary | ICD-10-CM

## 2020-11-25 LAB — IRON,TIBC AND FERRITIN PANEL
%SAT: 29 % (calc) (ref 16–48)
Ferritin: 47 ng/mL (ref 38–380)
Iron: 103 ug/dL (ref 27–164)
TIBC: 352 mcg/dL (calc) (ref 271–448)

## 2020-11-25 LAB — CBC
HCT: 39 % (ref 38.5–50.0)
Hemoglobin: 13.7 g/dL (ref 13.2–17.1)
MCH: 32 pg (ref 27.0–33.0)
MCHC: 35.1 g/dL (ref 32.0–36.0)
MCV: 91.1 fL (ref 80.0–100.0)
MPV: 9.3 fL (ref 7.5–12.5)
Platelets: 302 10*3/uL (ref 140–400)
RBC: 4.28 10*6/uL (ref 4.20–5.80)
RDW: 11.7 % (ref 11.0–15.0)
WBC: 7.8 10*3/uL (ref 3.8–10.8)

## 2020-11-25 LAB — COMPREHENSIVE METABOLIC PANEL
AG Ratio: 2.6 (calc) — ABNORMAL HIGH (ref 1.0–2.5)
ALT: 8 U/L (ref 8–46)
AST: 12 U/L (ref 12–32)
Albumin: 5 g/dL (ref 3.6–5.1)
Alkaline phosphatase (APISO): 61 U/L (ref 46–169)
BUN: 11 mg/dL (ref 7–20)
CO2: 32 mmol/L (ref 20–32)
Calcium: 9.8 mg/dL (ref 8.9–10.4)
Chloride: 102 mmol/L (ref 98–110)
Creat: 1.1 mg/dL (ref 0.60–1.26)
Globulin: 1.9 g/dL (calc) — ABNORMAL LOW (ref 2.1–3.5)
Glucose, Bld: 84 mg/dL (ref 65–99)
Potassium: 4.3 mmol/L (ref 3.8–5.1)
Sodium: 139 mmol/L (ref 135–146)
Total Bilirubin: 1.1 mg/dL (ref 0.2–1.1)
Total Protein: 6.9 g/dL (ref 6.3–8.2)

## 2020-11-25 LAB — TSH: TSH: 0.7 mIU/L (ref 0.50–4.30)

## 2020-11-25 LAB — VITAMIN B12: Vitamin B-12: 459 pg/mL (ref 200–1100)

## 2020-11-25 LAB — VITAMIN D 25 HYDROXY (VIT D DEFICIENCY, FRACTURES): Vit D, 25-Hydroxy: 22 ng/mL — ABNORMAL LOW (ref 30–100)

## 2020-11-29 ENCOUNTER — Telehealth: Payer: No Typology Code available for payment source | Admitting: Child and Adolescent Psychiatry

## 2020-11-29 ENCOUNTER — Telehealth: Payer: Self-pay | Admitting: Child and Adolescent Psychiatry

## 2020-11-29 ENCOUNTER — Other Ambulatory Visit: Payer: Self-pay

## 2020-11-29 ENCOUNTER — Other Ambulatory Visit: Payer: Self-pay | Admitting: Child and Adolescent Psychiatry

## 2020-11-29 DIAGNOSIS — F331 Major depressive disorder, recurrent, moderate: Secondary | ICD-10-CM

## 2020-11-29 DIAGNOSIS — F418 Other specified anxiety disorders: Secondary | ICD-10-CM

## 2020-11-29 DIAGNOSIS — F422 Mixed obsessional thoughts and acts: Secondary | ICD-10-CM

## 2020-11-29 MED ORDER — BUSPIRONE HCL 10 MG PO TABS
10.0000 mg | ORAL_TABLET | Freq: Two times a day (BID) | ORAL | 0 refills | Status: DC
Start: 2020-11-29 — End: 2020-12-26

## 2020-11-29 MED ORDER — ESCITALOPRAM OXALATE 20 MG PO TABS: 20.0000 mg | ORAL_TABLET | Freq: Every day | ORAL | 0 refills | Status: DC

## 2020-11-29 MED ORDER — HYDROXYZINE HCL 10 MG PO TABS
10.0000 mg | ORAL_TABLET | Freq: Three times a day (TID) | ORAL | 0 refills | Status: DC | PRN
Start: 2020-11-29 — End: 2020-12-27

## 2020-11-29 NOTE — Telephone Encounter (Signed)
Writer sent the link to connect on video and also called patient at the time of his appointment. He did not pick up the phone. Writer called 2nd time and his mother picked up the phone and reported that Christian Bond gave her the phone and conveyed to her that he does not want to talk to this Clinical research associate today. Writer explored the reason for nto wanting to attend today's appointment, Mother reports that he has been doing well with his medications but he is more stressed since last 2 days on deciding whether he wants to go to school or work. She reports that she and his father and him will decide on when to reschedule this appointment and call us back. She denied safety concerns for patient and was recommended to continue to provide increased supervision. M verbalized understanding. She asked for med refills, and reproted that [name] has not been taking Wellbutrin XL consistently so needs refills for only Buspar, Lexapro and Atarax. Writer agreed to provide refills.

## 2020-12-02 ENCOUNTER — Telehealth: Payer: Self-pay | Admitting: Child and Adolescent Psychiatry

## 2020-12-02 NOTE — Telephone Encounter (Signed)
Father texted via epic -    Hi Dr. Jerold Coombe, I'm so sorry Raef cancelled his appointment at the last minute on Tuesday. He's been increasingly isolated, not wanting to interact with anyone, even his friends. He lays in bed all day unless we make him get up. He was supposed to register for classes at [facility] yesterday. He drove there and sat in his car by himself for an hour. His mom went to check on him which prompted him to get out of the car and go into the registration building. But then he just wandered around for another hour before giving up and coming back home. He seems to be frustrated with his inability to complete any task. Everyday he says he's going to complete a job application, then he just lays in bed all day. He is taking the lexapro and the buspirone, but I think he stopped taking Wellbutrin. We've been trying to get him to go to an inpatient or intensive outpatient therapy facility, but he hates therapy. I can't tell if he is just severely depressed or if there is some other psychosis going on since he is incapable of verbalizing what's going through his mind. He does have an appointment with his PCP on Monday which he will hopefully keep. Just wanted to update you. Thanks for your patience.    Writer's reply -  Hi Dr. Sherrie Mustache, It seems like he is doing much worse as compare to few weeks ago when I saw him. I am concerned based on your description of him. He appears severely depressed and psychosis can occur in the context of severe depression. It is hard to comment on this unless I can evaluate him. I would recommend you bring him to ER or [facility] urgent care on [address] at[address] for the evaluation and they can provide recommendation on inpatient vs outpatient. I have spoken with your wife regarding safety, recommended increased supervision, locking up meds including OTC meds and sharps, locking up firearms if any at home. If you have any safety concerns you just need to bring  him to ER.

## 2020-12-05 ENCOUNTER — Encounter: Payer: Self-pay | Admitting: Family Medicine

## 2020-12-05 ENCOUNTER — Telehealth (INDEPENDENT_AMBULATORY_CARE_PROVIDER_SITE_OTHER): Payer: 59 | Admitting: Family Medicine

## 2020-12-05 ENCOUNTER — Other Ambulatory Visit: Payer: Self-pay

## 2020-12-05 DIAGNOSIS — E559 Vitamin D deficiency, unspecified: Secondary | ICD-10-CM | POA: Insufficient documentation

## 2020-12-05 DIAGNOSIS — F330 Major depressive disorder, recurrent, mild: Secondary | ICD-10-CM | POA: Diagnosis not present

## 2020-12-05 DIAGNOSIS — F418 Other specified anxiety disorders: Secondary | ICD-10-CM | POA: Diagnosis not present

## 2020-12-05 NOTE — Assessment & Plan Note (Signed)
I recommended that he start on 1000 international units once daily.  We can plan to recheck at follow-up in 3 to 4 months.

## 2020-12-05 NOTE — Assessment & Plan Note (Signed)
Patient reports improving symptoms of anxiety and depression while on Lexapro 20 mg once daily and buspirone 10 mg twice daily.  He denies suicidal and homicidal ideation.  He also denies auditory and visual hallucinations.  I encouraged him to follow-up with his psychiatrist as he did miss a recent appointment with them.  I will forward my note to his psychiatrist as an update.

## 2020-12-05 NOTE — Assessment & Plan Note (Signed)
He reports improving anxiety.  I discussed that he needs to follow-up with psychiatry.  He can continue on his current regimen through psychiatry.

## 2020-12-05 NOTE — Progress Notes (Signed)
Virtual Visit via video Note  This visit type was conducted due to national recommendations for restrictions regarding the COVID-19 pandemic (e.g. social distancing).  This format is felt to be most appropriate for this patient at this time.  All issues noted in this document were discussed and addressed.  No physical exam was performed (except for noted visual exam findings with Video Visits).   I connected with Christian Bond today at 11:00 AM EST by a video enabled telemedicine application and verified that I am speaking with the correct person using two identifiers. Location patient: home Location provider: work Persons participating in the virtual visit: patient, provider  I discussed the limitations, risks, security and privacy concerns of performing an evaluation and management service by telephone and the availability of in person appointments. I also discussed with the patient that there may be a patient responsible charge related to this service. The patient expressed understanding and agreed to proceed.   Reason for visit: f/u.  HPI: Anxiety/depression: Patient presents by himself for the visit today.  He notes he generally feels as though the anxiety and depression has improved to a certain degree compared to previously.  He has been on Lexapro and BuSpar.  His psychiatrist previously recommended to try Wellbutrin though the patient has not started on this as he feels that the Lexapro and BuSpar are working adequately.  He missed a recent appointment with psychiatry and has not rescheduled.  He denies SI and HI.  He denies auditory and visual hallucinations.  He reports his anxiety seems to depend on what is going on around him.  They did previously consider residential treatment for anxiety, depression, and OCD though the patient notes that he is deferring that at this time.  He does note he has been spending some time with some of his friends and enjoys that.  He also has 2 parakeets  which he enjoys as well.  He is registered for several classes at [employer] and notes those will be virtual.  He is looking forward to starting classes.   ROS: See pertinent positives and negatives per HPI.  Past Medical History:  Diagnosis Date  . ADHD (attention deficit hyperactivity disorder)   . Allergy     Past Surgical History:  Procedure Laterality Date  . TONSILLECTOMY AND ADENOIDECTOMY      Family History  Problem Relation Age of Onset  . Colon polyps Mother   . Stroke Maternal Uncle   . Heart disease Maternal Grandfather   . Hyperlipidemia Maternal Grandfather   . Stroke Maternal Grandfather     SOCIAL HX: Non-smoker   Current Outpatient Medications:  .  buPROPion (WELLBUTRIN XL) 150 MG 24 hr tablet, Take 1 tablet (150 mg total) by mouth daily., Disp: 30 tablet, Rfl: 0 .  busPIRone (BUSPAR) 10 MG tablet, Take 1 tablet (10 mg total) by mouth 2 (two) times daily., Disp: 60 tablet, Rfl: 0 .  escitalopram (LEXAPRO) 20 MG tablet, Take 1 tablet (20 mg total) by mouth daily., Disp: 30 tablet, Rfl: 0 .  fexofenadine (ALLEGRA) 180 MG tablet, Take 180 mg by mouth daily., Disp: , Rfl:  .  hydrOXYzine (ATARAX/VISTARIL) 10 MG tablet, Take 1 tablet (10 mg total) by mouth 3 (three) times daily as needed for anxiety., Disp: 90 tablet, Rfl: 0 .  hydrOXYzine (ATARAX/VISTARIL) 25 MG tablet, Take 0.5-1 tablets(12.5-25 mg total) by mouthe 3(three) times daily as needed for anxiety, and 1 tablet(25 mg total) at bedtime as needed for  sleeping difficulties., Disp: 30 tablet, Rfl: 0  EXAM:  VITALS per patient if applicable:  GENERAL: alert, oriented, appears well and in no acute distress  HEENT: atraumatic, conjunttiva clear, no obvious abnormalities on inspection of external nose and ears  NECK: normal movements of the head and neck  LUNGS: on inspection no signs of respiratory distress, breathing rate appears normal, no obvious gross SOB, gasping or wheezing  CV:  no obvious cyanosis  MS: moves all visible extremities without noticeable abnormality  PSYCH/NEURO: pleasant and cooperative, affect was somewhat flat, does have some difficulty getting his thoughts our verbally  ASSESSMENT AND PLAN:  Discussed the following assessment and plan:  Problem List Items Addressed This Visit    Mild episode of recurrent major depressive disorder (HCC)    Patient reports improving symptoms of anxiety and depression while on Lexapro 20 mg once daily and buspirone 10 mg twice daily.  He denies suicidal and homicidal ideation.  He also denies auditory and visual hallucinations.  I encouraged him to follow-up with his psychiatrist as he did miss a recent appointment with them.  I will forward my note to his psychiatrist as an update.      Other specified anxiety disorders    He reports improving anxiety.  I discussed that he needs to follow-up with psychiatry.  He can continue on his current regimen through psychiatry.      Vitamin D deficiency    I recommended that he start on 1000 international units once daily.  We can plan to recheck at follow-up in 3 to 4 months.          I discussed the assessment and treatment plan with the patient. The patient was provided an opportunity to ask questions and all were answered. The patient agreed with the plan and demonstrated an understanding of the instructions.   The patient was advised to call back or seek an in-person evaluation if the symptoms worsen or if the condition fails to improve as anticipated.   Marikay Alar, MD

## 2020-12-20 ENCOUNTER — Encounter: Payer: Self-pay | Admitting: Emergency Medicine

## 2020-12-20 ENCOUNTER — Emergency Department
Admission: EM | Admit: 2020-12-20 | Discharge: 2020-12-21 | Disposition: A | Discharge status: Home / Self Care | Payer: 59 | Attending: Emergency Medicine | Admitting: Emergency Medicine

## 2020-12-20 ENCOUNTER — Other Ambulatory Visit: Payer: Self-pay

## 2020-12-20 DIAGNOSIS — F322 Major depressive disorder, single episode, severe without psychotic features: Secondary | ICD-10-CM | POA: Diagnosis not present

## 2020-12-20 DIAGNOSIS — F330 Major depressive disorder, recurrent, mild: Secondary | ICD-10-CM | POA: Diagnosis present

## 2020-12-20 DIAGNOSIS — R45851 Suicidal ideations: Secondary | ICD-10-CM | POA: Insufficient documentation

## 2020-12-20 DIAGNOSIS — Z20822 Contact with and (suspected) exposure to covid-19: Secondary | ICD-10-CM | POA: Diagnosis not present

## 2020-12-20 DIAGNOSIS — F418 Other specified anxiety disorders: Secondary | ICD-10-CM | POA: Diagnosis present

## 2020-12-20 DIAGNOSIS — F1910 Other psychoactive substance abuse, uncomplicated: Secondary | ICD-10-CM | POA: Diagnosis not present

## 2020-12-20 DIAGNOSIS — F1210 Cannabis abuse, uncomplicated: Secondary | ICD-10-CM | POA: Diagnosis not present

## 2020-12-20 DIAGNOSIS — F902 Attention-deficit hyperactivity disorder, combined type: Secondary | ICD-10-CM | POA: Diagnosis present

## 2020-12-20 DIAGNOSIS — F422 Mixed obsessional thoughts and acts: Secondary | ICD-10-CM | POA: Diagnosis present

## 2020-12-20 DIAGNOSIS — F429 Obsessive-compulsive disorder, unspecified: Secondary | ICD-10-CM | POA: Diagnosis present

## 2020-12-20 LAB — SALICYLATE LEVEL: Salicylate Lvl: <7 mg/dL — ABNORMAL LOW (ref 7.0–30.0)

## 2020-12-20 LAB — ACETAMINOPHEN LEVEL: Acetaminophen (Tylenol), Serum: <10 ug/mL — ABNORMAL LOW (ref 10–30)

## 2020-12-20 LAB — URINE DRUG SCREEN, QUALITATIVE (ARMC ONLY)
Amphetamines, Ur Screen: NOT DETECTED
Barbiturates, Ur Screen: NOT DETECTED
Benzodiazepine, Ur Scrn: NOT DETECTED
Cannabinoid 50 Ng, Ur ~~LOC~~: POSITIVE — AB
Cocaine Metabolite,Ur ~~LOC~~: NOT DETECTED
MDMA (Ecstasy)Ur Screen: NOT DETECTED
Methadone Scn, Ur: NOT DETECTED
Opiate, Ur Screen: NOT DETECTED
Phencyclidine (PCP) Ur S: NOT DETECTED
Tricyclic, Ur Screen: NOT DETECTED

## 2020-12-20 LAB — COMPREHENSIVE METABOLIC PANEL
ALT: 28 U/L (ref 0–44)
AST: 19 U/L (ref 15–41)
Albumin: 5.4 g/dL — ABNORMAL HIGH (ref 3.5–5.0)
Alkaline Phosphatase: 63 U/L (ref 38–126)
Anion gap: 13 (ref 5–15)
BUN: 10 mg/dL (ref 6–20)
CO2: 27 mmol/L (ref 22–32)
Calcium: 10 mg/dL (ref 8.9–10.3)
Chloride: 101 mmol/L (ref 98–111)
Creatinine, Ser: 0.84 mg/dL (ref 0.61–1.24)
GFR, Estimated: >60 mL/min (ref >60)
Glucose, Bld: 99 mg/dL (ref 70–99)
Potassium: 3.4 mmol/L — ABNORMAL LOW (ref 3.5–5.1)
Sodium: 141 mmol/L (ref 135–145)
Total Bilirubin: 1.1 mg/dL (ref 0.3–1.2)
Total Protein: 8.7 g/dL — ABNORMAL HIGH (ref 6.5–8.1)

## 2020-12-20 LAB — CBC
HCT: 41.1 % (ref 39.0–52.0)
Hemoglobin: 14.7 g/dL (ref 13.0–17.0)
MCH: 32.4 pg (ref 26.0–34.0)
MCHC: 35.8 g/dL (ref 30.0–36.0)
MCV: 90.5 fL (ref 80.0–100.0)
Platelets: 315 10*3/uL (ref 150–400)
RBC: 4.54 MIL/uL (ref 4.22–5.81)
RDW: 11.9 % (ref 11.5–15.5)
WBC: 11.7 10*3/uL — ABNORMAL HIGH (ref 4.0–10.5)
nRBC: 0 % (ref 0.0–0.2)

## 2020-12-20 LAB — ETHANOL: Alcohol, Ethyl (B): <10 mg/dL (ref <10)

## 2020-12-20 LAB — POC SARS CORONAVIRUS 2 AG -  ED: SARS Coronavirus 2 Ag: NEGATIVE

## 2020-12-20 NOTE — ED Provider Notes (Signed)
[facility] Emergency Department Provider Note   ____________________________________________   Event Date/Time   First MD Initiated Contact with Patient 12/20/20 2116     (approximate)  I have reviewed the triage vital signs and the nursing notes.   HISTORY  Chief Complaint Psychiatric Evaluation    HPI ERCIL CASSIS is a 20 y.o. male reports a history of years of low mood feels like he has some type of distant feeling and disturbing thoughts from time to time.  He also uses synthetic marijuana was frequently.  He had a couple days ago an episode where he took 4 Lexapro tablets in attempt to harm himself.  He notified his parents today of suicidal thoughts still ongoing, prompting him to come to the ER for evaluation.  History also comes from family friend Nanine Means who came with him tonight.  She reports that the family feels that he does need to be under commitment and there for concerns about worsening issues around substance abuse with marijuana as well as worsening suicidal thoughts.  He has had similar symptoms like this for some time and around Thanksgiving family tried to get him into psychiatric treatment but he ultimately did not go  Past Medical History:  Diagnosis Date  . ADHD (attention deficit hyperactivity disorder)   . Allergy     Patient Active Problem List   Diagnosis Date Noted  . Vitamin D deficiency 12/05/2020  . Mild episode of recurrent major depressive disorder (HCC) 09/09/2020  . Attention deficit hyperactivity disorder (ADHD), combined type 08/25/2020  . Mixed obsessional thoughts and acts 08/25/2020  . Other specified anxiety disorders 05/29/2018  . Acne 02/26/2018  . Allergic rhinitis 01/28/2016    Past Surgical History:  Procedure Laterality Date  . TONSILLECTOMY AND ADENOIDECTOMY      Prior to Admission medications   Medication Sig Start Date End Date Taking? Authorizing Provider  buPROPion (WELLBUTRIN  XL) 150 MG 24 hr tablet Take 1 tablet (150 mg total) by mouth daily. 11/01/20   [name], MD  busPIRone (BUSPAR) 10 MG tablet Take 1 tablet (10 mg total) by mouth 2 (two) times daily. 11/29/20   [name], MD  escitalopram (LEXAPRO) 20 MG tablet Take 1 tablet (20 mg total) by mouth daily. 11/29/20   [name], MD  fexofenadine (ALLEGRA) 180 MG tablet Take 180 mg by mouth daily.    [provider]  hydrOXYzine (ATARAX/VISTARIL) 10 MG tablet Take 1 tablet (10 mg total) by mouth 3 (three) times daily as needed for anxiety. 11/29/20   [name], MD  hydrOXYzine (ATARAX/VISTARIL) 25 MG tablet Take 0.5-1 tablets(12.5-25 mg total) by mouthe 3(three) times daily as needed for anxiety, and 1 tablet(25 mg total) at bedtime as needed for sleeping difficulties. 10/18/20   [name], MD    Allergies Amoxicillin and Penicillins  Family History  Problem Relation Age of Onset  . Colon polyps Mother   . Stroke Maternal Uncle   . Heart disease Maternal Grandfather   . Hyperlipidemia Maternal Grandfather   . Stroke Maternal Grandfather     Social History Social History   Tobacco Use  . Smoking status: Never Smoker  . Smokeless tobacco: Never Used  Vaping Use  . Vaping Use: Some days  Substance Use Topics  . Alcohol use: No  . Drug use: No    Review of Systems Constitutional: No fever/chills or recent illness Eyes: No visual changes. ENT: No sore throat. Cardiovascular:  Denies chest pain. Respiratory: Denies shortness of breath. Gastrointestinal: No abdominal pain.  Occasionally feels nauseated when he feels anxious Musculoskeletal: Negative for back pain. Neurological: Negative for headaches    ____________________________________________   PHYSICAL EXAM:  VITAL SIGNS: ED Triage Vitals [12/20/20 2100]  Enc Vitals Group     BP      Pulse      Resp      Temp      Temp src      SpO2      Weight      Height      Head Circumference      Peak  Flow      Pain Score 0     Pain Loc      Pain Edu?      Excl. in GC?     Constitutional: Alert and oriented. Well appearing and in no acute distress.  Seems slightly anxious. Eyes: Conjunctivae are normal.  Pupils are just slightly dilated bilateral. Head: Atraumatic. Nose: No congestion/rhinnorhea. Mouth/Throat: Mucous membranes are moist. Neck: No stridor.  Cardiovascular: Normal rate, regular rhythm. Grossly normal heart sounds.  Good peripheral circulation. Respiratory: Normal respiratory effort.  No retractions. Lungs CTAB. Gastrointestinal: Soft and nontender. No distention. Musculoskeletal: No lower extremity tenderness nor edema. Neurologic:  Normal speech and language. No gross focal neurologic deficits are appreciated.  Skin:  Skin is warm, dry and intact. No rash noted. Psychiatric: Mood and affect are slightly anxious but also still slightly withdrawn, and he does endorse suicidal thoughts but did not take action upon the day however evidently did take extra Lexapro intent to harm self a few days ago (4 tabs).  ____________________________________________   LABS (all labs ordered are listed, but only abnormal results are displayed)  Labs Reviewed  COMPREHENSIVE METABOLIC PANEL - Abnormal; Notable for the following components:      Result Value   Potassium 3.4 (*)    Total Protein 8.7 (*)    Albumin 5.4 (*)    All other components within normal limits  SALICYLATE LEVEL - Abnormal; Notable for the following components:   Salicylate Lvl <7.0 (*)    All other components within normal limits  ACETAMINOPHEN LEVEL - Abnormal; Notable for the following components:   Acetaminophen (Tylenol), Serum <10 (*)    All other components within normal limits  CBC - Abnormal; Notable for the following components:   WBC 11.7 (*)    All other components within normal limits  URINE DRUG SCREEN, QUALITATIVE ([facility] ONLY) - Abnormal; Notable for the following components:   Cannabinoid 50  Ng, Ur [place] POSITIVE (*)    All other components within normal limits  SARS CORONAVIRUS 2 (TAT 6-24 HRS)  ETHANOL   ____________________________________________  EKG   ____________________________________________  RADIOLOGY   ____________________________________________   PROCEDURES  Procedure(s) performed: None  Procedures  Critical Care performed: No  ____________________________________________   INITIAL IMPRESSION / ASSESSMENT AND PLAN / ED COURSE  Pertinent labs & imaging results that were available during my care of the patient were reviewed by me and considered in my medical decision making (see chart for details).   Patient seen evaluated by psychiatry, they advised patient would benefit from psychiatric admission.  He is under IVC out of caution and concern for suicidal thoughts  Clinical Course as of 12/20/20 2214  Tue Dec 20, 2020  2156 Medically cleared at this time. Psych NP (Jacquiline) advises will be admitted to psychiatry. [MQ]    Clinical Course User  Index [MQ] Sharyn Creamer, MD   The patient did give me, prior to speaking to Nanine Means, permission to discuss his presentation today with her or also with his parents.    Patient does not have evidence of acute medical condition.  He is hemodynamically stable, pleasant but does seem to have an actively endorse suicidal thoughts.  I suspect substance abuse including synthetic cannabinoids may be a large part of his presentation, but also underlying psychiatric disease is suspected.  He has no acute neurologic symptoms.  The symptoms seem to be an ongoing issue that is presented over several years based on the history he provides and also that of [employer].  Consultation placed with psychiatry and case discussed with Chinita Greenland  Covid test is pending at time of medical clearance  ____________________________________________   FINAL CLINICAL IMPRESSION(S) / ED DIAGNOSES  Final diagnoses:   Suicidal thoughts  Drug abuse [facility])        Note:  This document was prepared using Dragon voice recognition software and may include unintentional dictation errors       Sharyn Creamer, MD 12/20/20 2214

## 2020-12-20 NOTE — ED Notes (Signed)
Patient given warm blanket, sandwich tray, and water to drink. Patient in interview room with psych at this time.

## 2020-12-20 NOTE — ED Triage Notes (Signed)
Pt to ED from home c/o disturbing thoughts.  Pt does not feel comfortable answering most questions in triage, states his thoughts are very disturbing.  States has been smoke marijuana for last two years but has felt disconnected from reality before that.  Pt calm and cooperative in triage.  States feels safe at home and not being threatened but feels paranoid about parents and friends surrounding feeling like someone has broken into house or like they want him to kill himself.  Pt dressed into hospital appropriate scrubs.  Belongings placed into 1 bag. Contents include: camo crocs, 1 black long sleeve shirt, 1 pair red pants, cell phone, vape.

## 2020-12-20 NOTE — ED Notes (Signed)
Pt. Transferred to B[facility] from ED to room 3 after screening for contraband. Report to include Situation, Background, Assessment and Recommendations from [facility]. Pt. Oriented to unit including Q15 minute rounds as well as the security cameras for their protection. Patient is alert and oriented, warm and dry in no acute distress. Patient reported SI and  HI without a plan. Denied AVH. Pt. Encouraged to let me know if needs arise.
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
## 2020-12-20 NOTE — BH Assessment (Signed)
PATIENT BED AVAILABLE AFTER 12:00AM  Patient is to be admitted to [facility] by Psychiatric Nurse Practitioner Gillermo Murdoch.  Attending Physician will be Dr. Les Pou.   Patient has been assigned to room 305, by [facility] Charge Nurse Anthony.    ER staff is aware of the admission:  [facility] ER Secretary    Dr. Fanny Bien, ER MD   [facility] Patient's Nurse   Rosey Bath Patient Access.

## 2020-12-21 ENCOUNTER — Inpatient Hospital Stay
Admission: AD | Admit: 2020-12-21 | Discharge: 2020-12-27 | DRG: 885 | Disposition: A | Discharge status: Home / Self Care | Payer: 59 | Source: Intra-hospital | Attending: Behavioral Health | Admitting: Behavioral Health

## 2020-12-21 DIAGNOSIS — F418 Other specified anxiety disorders: Secondary | ICD-10-CM

## 2020-12-21 DIAGNOSIS — F331 Major depressive disorder, recurrent, moderate: Secondary | ICD-10-CM

## 2020-12-21 DIAGNOSIS — F429 Obsessive-compulsive disorder, unspecified: Secondary | ICD-10-CM | POA: Diagnosis present

## 2020-12-21 DIAGNOSIS — R45851 Suicidal ideations: Secondary | ICD-10-CM | POA: Diagnosis present

## 2020-12-21 DIAGNOSIS — F422 Mixed obsessional thoughts and acts: Secondary | ICD-10-CM | POA: Diagnosis present

## 2020-12-21 DIAGNOSIS — F333 Major depressive disorder, recurrent, severe with psychotic symptoms: Secondary | ICD-10-CM | POA: Diagnosis not present

## 2020-12-21 DIAGNOSIS — F411 Generalized anxiety disorder: Secondary | ICD-10-CM | POA: Diagnosis present

## 2020-12-21 DIAGNOSIS — G4700 Insomnia, unspecified: Secondary | ICD-10-CM | POA: Diagnosis present

## 2020-12-21 DIAGNOSIS — F332 Major depressive disorder, recurrent severe without psychotic features: Secondary | ICD-10-CM | POA: Diagnosis present

## 2020-12-21 DIAGNOSIS — Z79899 Other long term (current) drug therapy: Secondary | ICD-10-CM | POA: Diagnosis not present

## 2020-12-21 DIAGNOSIS — Z20822 Contact with and (suspected) exposure to covid-19: Secondary | ICD-10-CM | POA: Diagnosis present

## 2020-12-21 LAB — SARS CORONAVIRUS 2 (TAT 6-24 HRS): SARS Coronavirus 2: NEGATIVE

## 2020-12-21 MED ORDER — ARIPIPRAZOLE 10 MG PO TABS
10.0000 mg | ORAL_TABLET | Freq: Every day | ORAL | Status: DC
  Administered 2020-12-21 – 2020-12-23 (×3): 10 mg via ORAL
  Filled 2020-12-21 (×3): qty 1

## 2020-12-21 MED ORDER — MAGNESIUM HYDROXIDE 400 MG/5ML PO SUSP: 30.0000 mL | Freq: Every day | ORAL | Status: DC | PRN

## 2020-12-21 MED ORDER — HYDROXYZINE HCL 25 MG PO TABS
25.0000 mg | ORAL_TABLET | Freq: Four times a day (QID) | ORAL | Status: DC | PRN
  Administered 2020-12-21 – 2020-12-22 (×2): 25 mg via ORAL
  Filled 2020-12-21 (×2): qty 1

## 2020-12-21 MED ORDER — ACETAMINOPHEN 325 MG PO TABS: 650.0000 mg | ORAL_TABLET | Freq: Four times a day (QID) | ORAL | Status: DC | PRN

## 2020-12-21 MED ORDER — ALUM & MAG HYDROXIDE-SIMETH 200-200-20 MG/5ML PO SUSP: 30.0000 mL | ORAL | Status: DC | PRN

## 2020-12-21 MED ORDER — BUPROPION HCL ER (XL) 150 MG PO TB24
150.0000 mg | ORAL_TABLET | Freq: Every day | ORAL | Status: DC
  Administered 2020-12-21: 150 mg via ORAL
  Filled 2020-12-21: qty 1

## 2020-12-21 MED ORDER — ESCITALOPRAM OXALATE 10 MG PO TABS
20.0000 mg | ORAL_TABLET | Freq: Every day | ORAL | Status: DC
  Administered 2020-12-21 – 2020-12-27 (×7): 20 mg via ORAL
  Filled 2020-12-21 (×7): qty 2

## 2020-12-21 MED ORDER — BUSPIRONE HCL 5 MG PO TABS
10.0000 mg | ORAL_TABLET | Freq: Two times a day (BID) | ORAL | Status: DC
  Administered 2020-12-21 – 2020-12-27 (×13): 10 mg via ORAL
  Filled 2020-12-21 (×13): qty 2

## 2020-12-21 NOTE — Progress Notes (Signed)
Patient received from [facility], report received from [place], [place]. Patient calm and pleasant during assessment denying SI/HI/AVH. Patient presents with anxious affect. Patient given education, support, and encouragement to be active in his treatment plan. Patient oriented to the unit and his room. Patient minimal during assessment stating he was tired and wanted to get some rest. Patient informed to let staff know if he needed anything. Patient didn't have any medications scheduled this evening and didn't request anything PRN. Patient being monitored Q 15 minutes for safety per unit protocol. Pt remains safe on the unit.

## 2020-12-21 NOTE — [facility] Suicide Risk Assessment (Signed)
[facility] INPATIENT:  Family/Significant Other Suicide Prevention Education  Suicide Prevention Education:  Education Completed; Chaddrick Brue, mother, [phone] has been identified by the patient as the family member/significant other with whom the patient will be residing, and identified as the person(s) who will aid the patient in the event of a mental health crisis (suicidal ideations/suicide attempt).  With written consent from the patient, the family member/significant other has been provided the following suicide prevention education, prior to the and/or following the discharge of the patient.  The suicide prevention education provided includes the following:  Suicide risk factors  Suicide prevention and interventions  National Suicide Hotline telephone number  [facility] assessment telephone number  [facility] Emergency Assistance 911  [facility] and/or Residential Mobile Crisis Unit telephone number  Request made of family/significant other to:  Remove weapons (e.g., guns, rifles, knives), all items previously/currently identified as safety concern.    Remove drugs/medications (over-the-counter, prescriptions, illicit drugs), all items previously/currently identified as a safety concern.  The family member/significant other verbalizes understanding of the suicide prevention education information provided.  The family member/significant other agrees to remove the items of safety concern listed above.  Mother reports "he has been in a bad place for the past year and a half".  Mother reports that "he had a mental breakdown in late 2020".  Mother reports that they family attempted to get the patient aligned with mental health treatment, however, pt was noncompliant.  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 Mother reports pt attempted to restart school at [employer] "but only had one night at school before he had to come home".   Mother reports that "he became moody again like he did last fall".  Mother reports "he destroyed his bedroom last weekend".  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  Mother reports that patient again destroyed his bedroom prior to his admission.  Mother reports that [facility] has the patient's information and will hold.   Harden Mo 12/21/2020, 3:46 PM

## 2020-12-21 NOTE — ED Notes (Signed)
Hourly rounding reveals patient in room. No complaints, stable, in no acute distress. Q15 minute rounds and monitoring via Security Cameras to continue. 

## 2020-12-21 NOTE — H&P (Signed)
Psychiatric Admission Assessment Adult  Patient Identification: Christian Bond MRN:  [id number] Date of Evaluation:  12/21/2020 Chief Complaint:  MDD (major depressive disorder), recurrent episode, severe (HCC) [F33.2] Principal Diagnosis: MDD (major depressive disorder), recurrent episode, severe (HCC) Diagnosis:  Principal Problem:   MDD (major depressive disorder), recurrent episode, severe (HCC) Active Problems:   Mixed obsessional thoughts and acts  History of Present Illness: 20 year old male with history of MDD and OCD presenting with suicidal ideations and recent ingestion of Lexparo in suicide attempt. He reported that he has struggled with OCD since middle school, and depression for last three years, but feels things have worsened over the last two months. He endorses poor sleep, tiredness, poor concentration, and depressed mood. He also endorses anxiety with racing thoughts. Previously his OCD was contamination based with ritual handwashing, but seems to have transitioned into intrusive thoughts. He says the thoughts are disturbing, sexual in nature, and often aimed at blaming problems on his parents. He compulsively chants mantras in his head to try and make the thoughts go away. He describes the thoughts coming in multiple voices that he does not recognize inside of his head. He also occasionally hears noises outside of himself. Intrusive thoughts and auditory hallucinations outside of himself tend to worsen with depression or anxiety, and get better when he is euthymic. MDQ administered with patient as he was concerned for bipolar disorder. He did endorse spending more money on [facility] and nicotine to point he used up his savings and was trying to get money from his parents. He also endorsed speaking to more people. No discreet period of time 4-7 days where he did not sleep or felt he had excess energy. No increase in goal directed activity. He reports a decrease in sexual interest.  Presentation more consistent with MDD with psychotic features and worsening OCD. Discussed RBA of starting Abilify with patient, and he is in agreement with plan.   Associated Signs/Symptoms: Depression Symptoms:  depressed mood, insomnia, difficulty concentrating, suicidal thoughts with specific plan, suicidal attempt, loss of energy/fatigue, Duration of Depression Symptoms: Greater than two weeks  (Hypo) Manic Symptoms:  Financial Extravagance, Hallucinations, Impulsivity, Anxiety Symptoms:  Excessive Worry, Panic Symptoms, Obsessive Compulsive Symptoms:   intrusive thoughts, compulsion of chanting mantras in his head, Psychotic Symptoms:  Delusions, Hallucinations: Auditory Paranoia, Duration of Psychotic Symptoms: No data recorded PTSD Symptoms: Negative Total Time spent with patient: 1 hour  Past Psychiatric History: He had a past psychiatric history of OCD since middle school, MDD for 4 years. Previously treated with Lexapro and Buspar. Denies any other past medications. One suicide attempt via ingestion of 5 tablets of Lexapro. No previous hospitalizations.   Is the patient at risk to self? Yes.    Has the patient been a risk to self in the past 6 months? No.  Has the patient been a risk to self within the distant past? No.  Is the patient a risk to others? Yes.    Has the patient been a risk to others in the past 6 months? No.  Has the patient been a risk to others within the distant past? No.   Prior Inpatient Therapy:   Prior Outpatient Therapy:    Alcohol Screening: 1. How often do you have a drink containing alcohol?: Never 2. How many drinks containing alcohol do you have on a typical day when you are drinking?: 1 or 2 3. How often do you have six or more drinks on one occasion?: Never AUDIT-C Score:  0 4. How often during the last year have you found that you were not able to stop drinking once you had started?: Never 5. How often during the last year have you  failed to do what was normally expected from you because of drinking?: Never 6. How often during the last year have you needed a first drink in the morning to get yourself going after a heavy drinking session?: Never 7. How often during the last year have you had a feeling of guilt of remorse after drinking?: Never 8. How often during the last year have you been unable to remember what happened the night before because you had been drinking?: Never 9. Have you or someone else been injured as a result of your drinking?: No 10. Has a relative or friend or a doctor or another health worker been concerned about your drinking or suggested you cut down?: No Alcohol Use Disorder Identification Test Final Score (AUDIT): 0 Alcohol Brief Interventions/Follow-up: AUDIT Score <7 follow-up not indicated Substance Abuse History in the last 12 months:  Yes.   Consequences of Substance Abuse: Negative Previous Psychotropic Medications: Yes  Psychological Evaluations: Yes  Past Medical History:  Past Medical History:  Diagnosis Date  . ADHD (attention deficit hyperactivity disorder)   . Allergy     Past Surgical History:  Procedure Laterality Date  . TONSILLECTOMY AND ADENOIDECTOMY     Family History:  Family History  Problem Relation Age of Onset  . Colon polyps Mother   . Stroke Maternal Uncle   . Heart disease Maternal Grandfather   . Hyperlipidemia Maternal Grandfather   . Stroke Maternal Grandfather    Family Psychiatric  History: Maternal grandmother, unknown diagnosis and brother-mild depression Tobacco Screening: Have you used any form of tobacco in the last 30 days? (Cigarettes, Smokeless Tobacco, Cigars, and/or Pipes): No Social History:  Social History   Substance and Sexual Activity  Alcohol Use No     Social History   Substance and Sexual Activity  Drug Use No    Additional Social History:                           Allergies:   Allergies  Allergen Reactions   . Amoxicillin Rash    Erythema multiforma  . Penicillins Other (See Comments)    Blisters.   Lab Results:  Results for orders placed or performed during the hospital encounter of 12/20/20 (from the past 48 hour(s))  Comprehensive metabolic panel     Status: Abnormal   Collection Time: 12/20/20  9:05 PM  Result Value Ref Range   Sodium 141 135 - 145 mmol/L   Potassium 3.4 (L) 3.5 - 5.1 mmol/L   Chloride 101 98 - 111 mmol/L   CO2 27 22 - 32 mmol/L   Glucose, Bld 99 70 - 99 mg/dL    Comment: Glucose reference range applies only to samples taken after fasting for at least 8 hours.   BUN 10 6 - 20 mg/dL   Creatinine, Ser 7.41 0.61 - 1.24 mg/dL   Calcium 28.7 8.9 - 86.7 mg/dL   Total Protein 8.7 (H) 6.5 - 8.1 g/dL   Albumin 5.4 (H) 3.5 - 5.0 g/dL   AST 19 15 - 41 U/L   ALT 28 0 - 44 U/L   Alkaline Phosphatase 63 38 - 126 U/L   Total Bilirubin 1.1 0.3 - 1.2 mg/dL   GFR, Estimated >67 >20 mL/min  Comment: (NOTE) Calculated using the CKD-EPI Creatinine Equation (2021)    Anion gap 13 5 - 15    Comment: Performed at [facility], [address] [place], [place] [address]  Ethanol     Status: None   Collection Time: 12/20/20  9:05 PM  Result Value Ref Range   Alcohol, Ethyl (B) <10 <10 mg/dL    Comment: (NOTE) Lowest detectable limit for serum alcohol is 10 mg/dL.  For medical purposes only. Performed at [facility], [address] [place], [place] [address]   Salicylate level     Status: Abnormal   Collection Time: 12/20/20  9:05 PM  Result Value Ref Range   Salicylate Lvl <7.0 (L) 7.0 - 30.0 mg/dL    Comment: Performed at [facility], [address] [place], [place] [address]  Acetaminophen level     Status: Abnormal   Collection Time: 12/20/20  9:05 PM  Result Value Ref Range   Acetaminophen (Tylenol), Serum <10 (L) 10 - 30 ug/mL    Comment: (NOTE) Therapeutic concentrations vary significantly. A range of 10-30 ug/mL  may be an  effective concentration for many patients. However, some  are best treated at concentrations outside of this range. Acetaminophen concentrations >150 ug/mL at 4 hours after ingestion  and >50 ug/mL at 12 hours after ingestion are often associated with  toxic reactions.  Performed at [facility], [address] [place], [place] [address]   cbc     Status: Abnormal   Collection Time: 12/20/20  9:05 PM  Result Value Ref Range   WBC 11.7 (H) 4.0 - 10.5 K/uL   RBC 4.54 4.22 - 5.81 MIL/uL   Hemoglobin 14.7 13.0 - 17.0 g/dL   HCT 65.9 93.5 - 70.1 %   MCV 90.5 80.0 - 100.0 fL   MCH 32.4 26.0 - 34.0 pg   MCHC 35.8 30.0 - 36.0 g/dL   RDW 77.9 39.0 - 30.0 %   Platelets 315 150 - 400 K/uL   nRBC 0.0 0.0 - 0.2 %    Comment: Performed at [facility], [address] [place], [place] [address]  Urine Drug Screen, Qualitative     Status: Abnormal   Collection Time: 12/20/20  9:05 PM  Result Value Ref Range   Tricyclic, Ur Screen NONE DETECTED NONE DETECTED   Amphetamines, Ur Screen NONE DETECTED NONE DETECTED   MDMA (Ecstasy)Ur Screen NONE DETECTED NONE DETECTED   Cocaine Metabolite,Ur [place] NONE DETECTED NONE DETECTED   Opiate, Ur Screen NONE DETECTED NONE DETECTED   Phencyclidine (PCP) Ur S NONE DETECTED NONE DETECTED   Cannabinoid 50 Ng, Ur [place] POSITIVE (A) NONE DETECTED   Barbiturates, Ur Screen NONE DETECTED NONE DETECTED   Benzodiazepine, Ur Scrn NONE DETECTED NONE DETECTED   Methadone Scn, Ur NONE DETECTED NONE DETECTED    Comment: (NOTE) Tricyclics + metabolites, urine    Cutoff 1000 ng/mL Amphetamines + metabolites, urine  Cutoff 1000 ng/mL MDMA (Ecstasy), urine              Cutoff 500 ng/mL Cocaine Metabolite, urine          Cutoff 300 ng/mL Opiate + metabolites, urine        Cutoff 300 ng/mL Phencyclidine (PCP), urine         Cutoff 25 ng/mL Cannabinoid, urine                 Cutoff 50 ng/mL Barbiturates + metabolites, urine  Cutoff 200 ng/mL  Benzodiazepine,  urine              Cutoff 200 ng/mL Methadone, urine                   Cutoff 300 ng/mL  The urine drug screen provides only a preliminary, unconfirmed analytical test result and should not be used for non-medical purposes. Clinical consideration and professional judgment should be applied to any positive drug screen result due to possible interfering substances. A more specific alternate chemical method must be used in order to obtain a confirmed analytical result. Gas chromatography / mass spectrometry (GC/MS) is the preferred confirm atory method. Performed at [facility], [address] [place], [place] [address]   SARS CORONAVIRUS 2 (TAT 6-24 HRS) Nasopharyngeal Nasopharyngeal Swab     Status: None   Collection Time: 12/20/20 10:03 PM   Specimen: Nasopharyngeal Swab  Result Value Ref Range   SARS Coronavirus 2 NEGATIVE NEGATIVE    Comment: (NOTE) SARS-CoV-2 target nucleic acids are NOT DETECTED.  The SARS-CoV-2 RNA is generally detectable in upper and lower respiratory specimens during the acute phase of infection. Negative results do not preclude SARS-CoV-2 infection, do not rule out co-infections with other pathogens, and should not be used as the sole basis for treatment or other patient management decisions. Negative results must be combined with clinical observations, patient history, and epidemiological information. The expected result is Negative.  Fact Sheet for Patients: HairSlick.no  Fact Sheet for Healthcare Providers: quierodirigir.com  This test is not yet approved or cleared by the [place] FDA and  has been authorized for detection and/or diagnosis of SARS-CoV-2 by FDA under an Emergency Use Authorization (EUA). This EUA will remain  in effect (meaning this test can be used) for the duration of the COVID-19 declaration under Se ction 564(b)(1) of the Act, 21 U.S.C. section  360bbb-3(b)(1), unless the authorization is terminated or revoked sooner.  Performed at [facility], [address] [place], [place] [address]   POC SARS Coronavirus 2 Ag-ED -     Status: None   Collection Time: 12/20/20 10:30 PM  Result Value Ref Range   SARS Coronavirus 2 Ag NEGATIVE NEGATIVE    Comment: (NOTE) SARS-CoV-2 antigen NOT DETECTED.   Negative results are presumptive.  Negative results do not preclude SARS-CoV-2 infection and should not be used as the sole basis for treatment or other patient management decisions, including infection  control decisions, particularly in the presence of clinical signs and  symptoms consistent with COVID-19, or in those who have been in contact with the virus.  Negative results must be combined with clinical observations, patient history, and epidemiological information. The expected result is Negative.  Fact Sheet for Patients: https://www.jennings-kim.com/  Fact Sheet for Healthcare Providers: https://alexander-rogers.biz/  This test is not yet approved or cleared by the [place] FDA and  has been authorized for detection and/or diagnosis of SARS-CoV-2 by FDA under an Emergency Use Authorization (EUA).  This EUA will remain in effect (meaning this test can be used) for the duration of  the COV ID-19 declaration under Section 564(b)(1) of the Act, 21 U.S.C. section 360bbb-3(b)(1), unless the authorization is terminated or revoked sooner.      Blood Alcohol level:  Lab Results  Component Value Date   ETH <10 12/20/2020    Metabolic Disorder Labs:  No results found for: HGBA1C, MPG No results found for: PROLACTIN No results found for: CHOL, TRIG, HDL, CHOLHDL, VLDL, LDLCALC  Current Medications: Current Facility-Administered Medications  Medication Dose Route Frequency Provider Last Rate Last Admin  . acetaminophen (TYLENOL) tablet 650 mg  650 mg Oral Q6H PRN Gillermo Murdochhompson, Jacqueline, NP       . alum & mag hydroxide-simeth (MAALOX/MYLANTA) 200-200-20 MG/5ML suspension 30 mL  30 mL Oral Q4H PRN Gillermo Murdochhompson, Jacqueline, NP      . ARIPiprazole (ABILIFY) tablet 10 mg  10 mg Oral Daily Les PouFreeman, Zebulin Siegel M, MD      . busPIRone (BUSPAR) tablet 10 mg  10 mg Oral BID Gillermo Murdochhompson, Jacqueline, NP   10 mg at 12/21/20 0817  . escitalopram (LEXAPRO) tablet 20 mg  20 mg Oral Daily Les PouFreeman, Jeannifer Drakeford M, MD   20 mg at 12/21/20 [id number]  . hydrOXYzine (ATARAX/VISTARIL) tablet 25 mg  25 mg Oral Q6H PRN Gillermo Murdochhompson, Jacqueline, NP      . magnesium hydroxide (MILK OF MAGNESIA) suspension 30 mL  30 mL Oral Daily PRN Gillermo Murdochhompson, Jacqueline, NP       PTA Medications: Medications Prior to Admission  Medication Sig Dispense Refill Last Dose  . buPROPion (WELLBUTRIN XL) 150 MG 24 hr tablet Take 1 tablet (150 mg total) by mouth daily. 30 tablet 0   . busPIRone (BUSPAR) 10 MG tablet Take 1 tablet (10 mg total) by mouth 2 (two) times daily. 60 tablet 0   . escitalopram (LEXAPRO) 20 MG tablet Take 1 tablet (20 mg total) by mouth daily. 30 tablet 0   . fexofenadine (ALLEGRA) 180 MG tablet Take 180 mg by mouth daily.     . hydrOXYzine (ATARAX/VISTARIL) 10 MG tablet Take 1 tablet (10 mg total) by mouth 3 (three) times daily as needed for anxiety. 90 tablet 0   . hydrOXYzine (ATARAX/VISTARIL) 25 MG tablet Take 0.5-1 tablets(12.5-25 mg total) by mouthe 3(three) times daily as needed for anxiety, and 1 tablet(25 mg total) at bedtime as needed for sleeping difficulties. 30 tablet 0     Musculoskeletal: Strength & Muscle Tone: within normal limits Gait & Station: normal Patient leans: N/A  Psychiatric Specialty Exam: Physical Exam Vitals and nursing note reviewed.  Constitutional:      Appearance: Normal appearance.  HENT:     Head: Normocephalic and atraumatic.     Right Ear: External ear normal.     Left Ear: External ear normal.     Nose: Nose normal.     Mouth/Throat:     Mouth: Mucous membranes are moist.     Pharynx:  Oropharynx is clear.  Eyes:     Extraocular Movements: Extraocular movements intact.     Conjunctiva/sclera: Conjunctivae normal.     Pupils: Pupils are equal, round, and reactive to light.  Cardiovascular:     Rate and Rhythm: Normal rate.     Pulses: Normal pulses.  Pulmonary:     Effort: Pulmonary effort is normal.     Breath sounds: Normal breath sounds.  Abdominal:     General: Abdomen is flat.     Palpations: Abdomen is soft.  Musculoskeletal:        General: No swelling. Normal range of motion.     Cervical back: Normal range of motion and neck supple.  Skin:    General: Skin is warm and dry.  Neurological:     Mental Status: He is alert.     Cranial Nerves: No cranial nerve deficit.     Motor: No weakness.  Psychiatric:        Attention and Perception: He is inattentive. He perceives auditory  hallucinations.        Mood and Affect: Affect normal. Mood is depressed.        Speech: Speech is delayed.        Behavior: Behavior is cooperative.        Thought Content: Thought content is paranoid and delusional. Thought content includes suicidal ideation. Thought content does not include homicidal ideation.        Cognition and Memory: Cognition is impaired. Memory is impaired.        Judgment: Judgment normal.     Review of Systems  Constitutional: Positive for fatigue. Negative for appetite change.  HENT: Negative for rhinorrhea and sore throat.   Eyes: Negative for photophobia and visual disturbance.  Respiratory: Negative for cough and shortness of breath.   Cardiovascular: Negative for chest pain and palpitations.  Gastrointestinal: Negative for constipation, diarrhea, nausea and vomiting.  Endocrine: Negative for cold intolerance and heat intolerance.  Genitourinary: Negative for difficulty urinating and dysuria.  Musculoskeletal: Negative for arthralgias and myalgias.  Skin: Negative for rash and wound.  Allergic/Immunologic: Negative for food allergies and  immunocompromised state.  Neurological: Negative for dizziness and headaches.  Hematological: Negative for adenopathy. Does not bruise/bleed easily.  Psychiatric/Behavioral: Positive for decreased concentration, dysphoric mood, hallucinations, sleep disturbance and suicidal ideas. The patient is nervous/anxious.     Blood pressure 108/75, pulse 64, temperature 98 F (36.7 C), temperature source Oral, resp. rate 14, height 5\' 10"  (1.778 m), weight 58.1 kg, SpO2 100 %.Body mass index is 18.37 kg/m.  General Appearance: Fairly Groomed and Guarded  Eye Contact:  Fair  Speech:  Blocked  Volume:  Normal  Mood:  Anxious and Depressed  Affect:  Congruent and Constricted  Thought Process:  Disorganized  Orientation:  Full (Time, Place, and Person)  Thought Content:  Illogical, Hallucinations: Auditory, Obsessions and Paranoid Ideation  Suicidal Thoughts:  Yes.  without intent/plan  Homicidal Thoughts:  No  Memory:  Immediate;   Fair Recent;   Fair Remote;   Fair  Judgement:  Impaired  Insight:  Present  Psychomotor Activity:  Restlessness  Concentration:  Concentration: Poor and Attention Span: Poor  Recall:  [employer] of Knowledge:  Fair  Language:  Fair  Akathisia:  Negative  Handed:  Right  AIMS (if indicated):     Assets:  Communication Skills Desire for Improvement Financial Resources/Insurance Housing Leisure Time Physical Health Social Support  ADL's:  Impaired  Cognition:  Impaired,  Mild  Sleep:  Number of Hours: 3       Treatment Plan Summary: Daily contact with patient to assess and evaluate symptoms and progress in treatment and Medication management PLAN OF CARE: 20 year old male with history of MDD and OCD presenting with suicidal ideations and recent ingestion of Lexparo in suicide attempt. Reporting numerous intrusive thoughts causing decreased concentration, distress, and anxiety. Presentation consistent with exacerbation of OCD and MDD, recurrent, severe with  psychotic features. Rule out bipolar II disorder. Discontinued Wellbutrin 150 mg daily. Continued Lexapro 20 mg daily, and start Abilify 10 mg daily for psychosis and mood.   Observation Level/Precautions:  15 minute checks  Laboratory:  lipid panel, hemoglobin a1c  Psychotherapy:    Medications:    Consultations:    Discharge Concerns:    Estimated LOS:  Other:     Physician Treatment Plan for Primary Diagnosis: MDD (major depressive disorder), recurrent episode, severe (HCC) Long Term Goal(s): Improvement in symptoms so as ready for discharge  Short Term Goals: Ability to  identify changes in lifestyle to reduce recurrence of condition will improve, Ability to verbalize feelings will improve, Ability to disclose and discuss suicidal ideas, Ability to demonstrate self-control will improve, Ability to identify and develop effective coping behaviors will improve, Ability to maintain clinical measurements within normal limits will improve, Compliance with prescribed medications will improve and Ability to identify triggers associated with substance abuse/mental health issues will improve  Physician Treatment Plan for Secondary Diagnosis: Principal Problem:   MDD (major depressive disorder), recurrent episode, severe (HCC) Active Problems:   Mixed obsessional thoughts and acts  Long Term Goal(s): Improvement in symptoms so as ready for discharge  Short Term Goals: Ability to identify changes in lifestyle to reduce recurrence of condition will improve, Ability to verbalize feelings will improve, Ability to disclose and discuss suicidal ideas, Ability to demonstrate self-control will improve, Ability to identify and develop effective coping behaviors will improve, Ability to maintain clinical measurements within normal limits will improve and Ability to identify triggers associated with substance abuse/mental health issues will improve  I certify that inpatient services furnished can reasonably be  expected to improve the patient's condition.    Jesse Sans, MD 1/26/202212:22 PM

## 2020-12-21 NOTE — [facility] Suicide Risk Assessment (Signed)
[facility] Ad[facility] Suicide Risk Assessment   Nursing information obtained from:  Patient Demographic factors:  Male,Adolescent or young adult Current Mental Status:  Suicidal ideation indicated by patient Loss Factors:  NA Historical Factors:  Impulsivity Risk Reduction Factors:  Living with another person, especially a relative,Positive therapeutic relationship  Total Time spent with patient: 1 hour Principal Problem: MDD (major depressive disorder), recurrent episode, severe (HCC) Diagnosis:  Principal Problem:   MDD (major depressive disorder), recurrent episode, severe (HCC) Active Problems:   Mixed obsessional thoughts and acts  Subjective Data:  20 year old male with history of MDD and OCD presenting with suicidal ideations and recent ingestion of Lexparo in suicide attempt. He reported that he has struggled with OCD since middle school, and depression for last three years, but feels things have worsened over the last two months. He endorses poor sleep, tiredness, poor concentration, and depressed mood. He also endorses anxiety with racing thoughts. Previously his OCD was contamination based with ritual handwashing, but seems to have transitioned into intrusive thoughts. He says the thoughts are disturbing, sexual in nature, and often aimed at blaming problems on his parents. He compulsively chants mantras in his head to try and make the thoughts go away. He describes the thoughts coming in multiple voices that he does not recognize inside of his head. He also occasionally hears noises outside of himself. Intrusive thoughts and auditory hallucinations outside of himself tend to worsen with depression or anxiety, and get better when he is euthymic. MDQ administered with patient as he was concerned for bipolar disorder. He did endorse spending more money on [facility] and nicotine to point he used up his savings and was trying to get money from his parents. He also endorsed speaking to more people. No  discreet period of time 4-7 days where he did not sleep or felt he had excess energy. No increase in goal directed activity. He reports a decrease in sexual interest. Presentation more consistent with MDD with psychotic features and worsening OCD. Discussed RBA of starting Abilify with patient, and he is in agreement with plan.   Continued Clinical Symptoms:  Alcohol Use Disorder Identification Test Final Score (AUDIT): 0 The "Alcohol Use Disorders Identification Test", Guidelines for Use in Primary Care, Second Edition.  World Science writer [facility]). Score between 0-7:  no or low risk or alcohol related problems. Score between 8-15:  moderate risk of alcohol related problems. Score between 16-19:  high risk of alcohol related problems. Score 20 or above:  warrants further diagnostic evaluation for alcohol dependence and treatment.   CLINICAL FACTORS:   Severe Anxiety and/or Agitation Depression:   Comorbid alcohol abuse/dependence Insomnia Severe Alcohol/Substance Abuse/Dependencies Obsessive-Compulsive Disorder More than one psychiatric diagnosis Currently Psychotic Previous Psychiatric Diagnoses and Treatments   Musculoskeletal: Strength & Muscle Tone: within normal limits Gait & Station: normal Patient leans: N/A  Psychiatric Specialty Exam: Physical Exam Vitals and nursing note reviewed.  Constitutional:      Appearance: Normal appearance.  HENT:     Head: Normocephalic and atraumatic.     Right Ear: External ear normal.     Left Ear: External ear normal.     Nose: Nose normal.     Mouth/Throat:     Mouth: Mucous membranes are moist.     Pharynx: Oropharynx is clear.  Eyes:     Extraocular Movements: Extraocular movements intact.     Conjunctiva/sclera: Conjunctivae normal.     Pupils: Pupils are equal, round, and reactive to light.  Cardiovascular:  Rate and Rhythm: Normal rate.     Pulses: Normal pulses.  Pulmonary:     Effort: Pulmonary effort is normal.      Breath sounds: Normal breath sounds.  Abdominal:     General: Abdomen is flat.     Palpations: Abdomen is soft.  Musculoskeletal:        General: No swelling. Normal range of motion.     Cervical back: Normal range of motion and neck supple.  Skin:    General: Skin is warm and dry.  Neurological:     Mental Status: He is alert.     Cranial Nerves: No cranial nerve deficit.     Motor: No weakness.  Psychiatric:        Attention and Perception: He is inattentive. He perceives auditory hallucinations.        Mood and Affect: Affect normal. Mood is depressed.        Speech: Speech is delayed.        Behavior: Behavior is cooperative.        Thought Content: Thought content is paranoid and delusional. Thought content includes suicidal ideation. Thought content does not include homicidal ideation.        Cognition and Memory: Cognition is impaired. Memory is impaired.        Judgment: Judgment normal.     Review of Systems  Constitutional: Positive for fatigue. Negative for appetite change.  HENT: Negative for rhinorrhea and sore throat.   Eyes: Negative for photophobia and visual disturbance.  Respiratory: Negative for cough and shortness of breath.   Cardiovascular: Negative for chest pain and palpitations.  Gastrointestinal: Negative for constipation, diarrhea, nausea and vomiting.  Endocrine: Negative for cold intolerance and heat intolerance.  Genitourinary: Negative for difficulty urinating and dysuria.  Musculoskeletal: Negative for arthralgias and myalgias.  Skin: Negative for rash and wound.  Allergic/Immunologic: Negative for food allergies and immunocompromised state.  Neurological: Negative for dizziness and headaches.  Hematological: Negative for adenopathy. Does not bruise/bleed easily.  Psychiatric/Behavioral: Positive for decreased concentration, dysphoric mood, hallucinations, sleep disturbance and suicidal ideas. The patient is nervous/anxious.     Blood  pressure 108/75, pulse 64, temperature 98 F (36.7 C), temperature source Oral, resp. rate 14, height 5\' 10"  (1.778 m), weight 58.1 kg, SpO2 100 %.Body mass index is 18.37 kg/m.  General Appearance: Fairly Groomed and Guarded  Eye Contact:  Fair  Speech:  Blocked  Volume:  Normal  Mood:  Anxious and Depressed  Affect:  Congruent and Constricted  Thought Process:  Disorganized  Orientation:  Full (Time, Place, and Person)  Thought Content:  Illogical, Hallucinations: Auditory, Obsessions and Paranoid Ideation  Suicidal Thoughts:  Yes.  without intent/plan  Homicidal Thoughts:  No  Memory:  Immediate;   Fair Recent;   Fair Remote;   Fair  Judgement:  Impaired  Insight:  Present  Psychomotor Activity:  Restlessness  Concentration:  Concentration: Poor and Attention Span: Poor  Recall:  of Knowledge:  Fair  Language:  Fair  Akathisia:  Negative  Handed:  Right  AIMS (if indicated):     Assets:  Communication Skills Desire for Improvement Financial Resources/Insurance Housing Leisure Time Physical Health Social Support  ADL's:  Impaired  Cognition:  Impaired,  Mild  Sleep:  Number of Hours: 3      COGNITIVE FEATURES THAT CONTRIBUTE TO RISK:  Loss of executive function    SUICIDE RISK:   Moderate:  Frequent suicidal ideation with limited intensity, and  duration, some specificity in terms of plans, no associated intent, good self-control, limited dysphoria/symptomatology, some risk factors present, and identifiable protective factors, including available and accessible social support.  PLAN OF CARE: 20 year old male with history of MDD and OCD presenting with suicidal ideations and recent ingestion of Lexparo in suicide attempt. Reporting numerous intrusive thoughts causing decreased concentration, distress, and anxiety. Presentation consistent with exacerbation of OCD and MDD, recurrent, severe with psychotic features. Rule out bipolar II disorder. Discontinued  Wellbutrin 150 mg daily. Continued Lexapro 20 mg daily, and start Abilify 10 mg daily for psychosis and mood.   I certify that inpatient services furnished can reasonably be expected to improve the patient's condition.   Jesse Sans, MD 12/21/2020, 11:53 AM

## 2020-12-21 NOTE — [facility] Group Notes (Signed)
LCSW Group Therapy Note  12/21/2020 2:04 PM  Type of Therapy/Topic:  Group Therapy:  Emotion Regulation  Participation Level:  Active   Description of Group:   The purpose of this group is to assist patients in learning to regulate negative emotions and experience positive emotions. Patients will be guided to discuss ways in which they have been vulnerable to their negative emotions. These vulnerabilities will be juxtaposed with experiences of positive emotions or situations, and patients will be challenged to use positive emotions to combat negative ones. Special emphasis will be placed on coping with negative emotions in conflict situations, and patients will process healthy conflict resolution skills.  Therapeutic Goals: 1. Patient will identify two positive emotions or experiences to reflect on in order to balance out negative emotions 2. Patient will label two or more emotions that they find the most difficult to experience 3. Patient will demonstrate positive conflict resolution skills through discussion and/or role plays  Summary of Patient Progress: Patient was present for the entirety of the group. He was involved in the conversation and was able to identify some emotions which lead to miscommunication with his parents. Patient's comments were pertinent to the discussion and he was appropriate.   Therapeutic Modalities:   Cognitive Behavioral Therapy Feelings Identification Dialectical Behavioral Therapy  Vilma Meckel. Algis Greenhouse, MSW, LCSW, LCAS 12/21/2020 2:04 PM

## 2020-12-21 NOTE — Tx Team (Signed)
Initial Treatment Plan 12/21/2020 1:59 AM Christian Bond [id number]    PATIENT STRESSORS: Medication change or noncompliance Substance abuse   PATIENT STRENGTHS: Capable of independent living Wealth of knowledge  PATIENT IDENTIFIED PROBLEMS: Major Depression  Suicidal Ideation                   DISCHARGE CRITERIA:  Improved stabilization in mood, thinking, and/or behavior  PRELIMINARY DISCHARGE PLAN: Outpatient therapy  PATIENT/FAMILY INVOLVEMENT: This treatment plan has been presented to and reviewed with the patient, Christian Bond, and/or family member, .  The patient and family have been given the opportunity to ask questions and make suggestions.  Shelia Media, RN 12/21/2020, 1:59 AM

## 2020-12-21 NOTE — ED Notes (Signed)
Patient is stable in NAD. He is transferred to [facility] via NT and officer. Patient belongings transferred with him. Report given to [facility] RN Matt. No issues.

## 2020-12-21 NOTE — Progress Notes (Signed)
Pt was seen on camera trying to push through the door to go outside. I confronted him and he said he was just looking around. He did not seem agitated but anxious. I offered him a PRN. Torrie Mayers RN
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
## 2020-12-21 NOTE — Tx Team (Addendum)
Interdisciplinary Treatment and Diagnostic Plan Update  12/21/2020 Time of Session: 9:00AM EMERIL Bond MRN: [id number]  Principal Diagnosis: MDD (major depressive disorder), recurrent episode, severe (Christian Bond)  Secondary Diagnoses: Principal Problem:   MDD (major depressive disorder), recurrent episode, severe (Christian Bond) Active Problems:   Mixed obsessional thoughts and acts   Current Medications:  Current Facility-Administered Medications  Medication Dose Route Frequency Provider Last Rate Last Admin  . acetaminophen (TYLENOL) tablet 650 mg  650 mg Oral Q6H PRN Caroline Sauger, NP      . alum & mag hydroxide-simeth (MAALOX/MYLANTA) 200-200-20 MG/5ML suspension 30 mL  30 mL Oral Q4H PRN Caroline Sauger, NP      . ARIPiprazole (ABILIFY) tablet 10 mg  10 mg Oral Daily Salley Scarlet, MD   10 mg at 12/21/20 1309  . busPIRone (BUSPAR) tablet 10 mg  10 mg Oral BID Caroline Sauger, NP   10 mg at 12/21/20 0817  . escitalopram (LEXAPRO) tablet 20 mg  20 mg Oral Daily Selina Cooley M, MD   20 mg at 12/21/20 8381  . hydrOXYzine (ATARAX/VISTARIL) tablet 25 mg  25 mg Oral Q6H PRN Caroline Sauger, NP      . magnesium hydroxide (MILK OF MAGNESIA) suspension 30 mL  30 mL Oral Daily PRN Caroline Sauger, NP       PTA Medications: Medications Prior to Admission  Medication Sig Dispense Refill Last Dose  . buPROPion (WELLBUTRIN XL) 150 MG 24 hr tablet Take 1 tablet (150 mg total) by mouth daily. 30 tablet 0   . busPIRone (BUSPAR) 10 MG tablet Take 1 tablet (10 mg total) by mouth 2 (two) times daily. 60 tablet 0   . escitalopram (LEXAPRO) 20 MG tablet Take 1 tablet (20 mg total) by mouth daily. 30 tablet 0   . fexofenadine (ALLEGRA) 180 MG tablet Take 180 mg by mouth daily.     . hydrOXYzine (ATARAX/VISTARIL) 10 MG tablet Take 1 tablet (10 mg total) by mouth 3 (three) times daily as needed for anxiety. 90 tablet 0   . hydrOXYzine (ATARAX/VISTARIL) 25 MG tablet Take 0.5-1  tablets(12.5-25 mg total) by mouthe 3(three) times daily as needed for anxiety, and 1 tablet(25 mg total) at bedtime as needed for sleeping difficulties. 30 tablet 0     Patient Stressors: Medication change or noncompliance Substance abuse  Patient Strengths: Capable of independent living  Treatment Modalities: Medication Management, Group therapy, Case management,  1 to 1 session with clinician, Psychoeducation, Recreational therapy.   Physician Treatment Plan for Primary Diagnosis: MDD (major depressive disorder), recurrent episode, severe (Christian Bond) Long Term Goal(s): Improvement in symptoms so as ready for discharge Improvement in symptoms so as ready for discharge   Short Term Goals: Ability to identify changes in lifestyle to reduce recurrence of condition will improve Ability to verbalize feelings will improve Ability to disclose and discuss suicidal ideas Ability to demonstrate self-control will improve Ability to identify and develop effective coping behaviors will improve Ability to maintain clinical measurements within normal limits will improve Compliance with prescribed medications will improve Ability to identify triggers associated with substance abuse/mental health issues will improve Ability to identify changes in lifestyle to reduce recurrence of condition will improve Ability to verbalize feelings will improve Ability to disclose and discuss suicidal ideas Ability to demonstrate self-control will improve Ability to identify and develop effective coping behaviors will improve Ability to maintain clinical measurements within normal limits will improve Ability to identify triggers associated with substance abuse/mental health issues will improve  Medication Management: Evaluate patient's response, side effects, and tolerance of medication regimen.  Therapeutic Interventions: 1 to 1 sessions, Unit Group sessions and Medication administration.  Evaluation of Outcomes: Not  Met  Physician Treatment Plan for Secondary Diagnosis: Principal Problem:   MDD (major depressive disorder), recurrent episode, severe (Christian Bond) Active Problems:   Mixed obsessional thoughts and acts  Long Term Goal(s): Improvement in symptoms so as ready for discharge Improvement in symptoms so as ready for discharge   Short Term Goals: Ability to identify changes in lifestyle to reduce recurrence of condition will improve Ability to verbalize feelings will improve Ability to disclose and discuss suicidal ideas Ability to demonstrate self-control will improve Ability to identify and develop effective coping behaviors will improve Ability to maintain clinical measurements within normal limits will improve Compliance with prescribed medications will improve Ability to identify triggers associated with substance abuse/mental health issues will improve Ability to identify changes in lifestyle to reduce recurrence of condition will improve Ability to verbalize feelings will improve Ability to disclose and discuss suicidal ideas Ability to demonstrate self-control will improve Ability to identify and develop effective coping behaviors will improve Ability to maintain clinical measurements within normal limits will improve Ability to identify triggers associated with substance abuse/mental health issues will improve     Medication Management: Evaluate patient's response, side effects, and tolerance of medication regimen.  Therapeutic Interventions: 1 to 1 sessions, Unit Group sessions and Medication administration.  Evaluation of Outcomes: Not Met   RN Treatment Plan for Primary Diagnosis: MDD (major depressive disorder), recurrent episode, severe ([name]) Long Term Goal(s): Knowledge of disease and therapeutic regimen to maintain health will improve  Short Term Goals: Ability to demonstrate self-control, Ability to participate in decision making will improve, Ability to verbalize feelings  will improve, Ability to disclose and discuss suicidal ideas, Ability to identify and develop effective coping behaviors will improve and Compliance with prescribed medications will improve  Medication Management: RN will administer medications as ordered by provider, will assess and evaluate patient's response and provide education to patient for prescribed medication. RN will report any adverse and/or side effects to prescribing provider.  Therapeutic Interventions: 1 on 1 counseling sessions, Psychoeducation, Medication administration, Evaluate responses to treatment, Monitor vital signs and CBGs as ordered, Perform/monitor CIWA, COWS, AIMS and Fall Risk screenings as ordered, Perform wound care treatments as ordered.  Evaluation of Outcomes: Not Met   LCSW Treatment Plan for Primary Diagnosis: MDD (major depressive disorder), recurrent episode, severe ([place]) Long Term Goal(s): Safe transition to appropriate next level of care at discharge, Engage patient in therapeutic group addressing interpersonal concerns.  Short Term Goals: Engage patient in aftercare planning with referrals and resources, Increase social support, Increase ability to appropriately verbalize feelings, Increase emotional regulation, Facilitate acceptance of mental health diagnosis and concerns and Increase skills for wellness and recovery  Therapeutic Interventions: Assess for all discharge needs, 1 to 1 time with Social worker, Explore available resources and support systems, Assess for adequacy in community support network, Educate family and significant other(s) on suicide prevention, Complete Psychosocial Assessment, Interpersonal group therapy.  Evaluation of Outcomes: Not Met   Progress in Treatment: Attending groups: No. Participating in groups: No. Taking medication as prescribed: Yes. Toleration medication: Yes. Family/Significant other contact made: Yes, individual(s) contacted:  once permission is given.   Patient understands diagnosis: Yes. Discussing patient identified problems/goals with staff: Yes. Medical problems stabilized or resolved: Yes. Denies suicidal/homicidal ideation: Yes. Issues/concerns per patient self-inventory: No. Other: none  New  problem(s) identified: No, Describe:  none  New Short Term/Long Term Goal(s): elimination of symptoms of psychosis, medication management for mood stabilization; elimination of SI thoughts; development of comprehensive mental wellness/sobriety plan.  Patient Goals:  "I don't know.  I was to go to [facility] in [place]"  Discharge Plan or Barriers:  CSW will assist patient in developing appropriate discharge plans.    Reason for Continuation of Hospitalization: Anxiety Depression Medical Issues Medication stabilization Suicidal ideation  Estimated Length of Stay:  1-7 days  Recreational Therapy: Patient Stressors: N/A Patient Goal: Patient will engage in groups without prompting or encouragement from LRT x3 group sessions within 5 recreation therapy group sessions.  Attendees: Patient: Christian Bond 12/21/2020 1:30 PM  Physician: Dr. Domingo Cocking, MD 12/21/2020 1:30 PM  Nursing: Collier Bullock, RN 12/21/2020 1:30 PM  RN Care Manager: 12/21/2020 1:30 PM  Social Worker: Assunta Curtis, MSW, LCSW 12/21/2020 1:30 PM  Recreational Therapist: Roanna Epley, CTRS, LRT 12/21/2020 1:30 PM  Other: Gwynneth Munson" [place], [place] 12/21/2020 1:30 PM  Other:  12/21/2020 1:30 PM  Other: 12/21/2020 1:30 PM    Scribe for Treatment Team: Rozann Lesches, LCSW 12/21/2020 1:30 PM

## 2020-12-21 NOTE — Progress Notes (Signed)
Recreation Therapy Notes   Date: 12/21/2020  Time: 9:30 am   Location: Craft room     Behavioral response: N/A   Intervention Topic: Wellness   Discussion/Intervention: Patient did not attend group.   Clinical Observations/Feedback:  Patient did not attend group.   [place] Nordquist LRT/CTRS        Elmire Amrein 12/21/2020 10:55 AM

## 2020-12-21 NOTE — Plan of Care (Signed)
Pt rates depression 7/10 and anxiety 3/10. Pt denies SI, HI and AVH. Pt was educated on care plan and verbalizes understanding. Torrie Mayers RN Problem: Education: Goal: Knowledge of [facility] Education information/materials will improve Outcome: Progressing Goal: Emotional status will improve Outcome: Progressing Goal: Mental status will improve Outcome: Progressing Goal: Verbalization of understanding the information provided will improve Outcome: Progressing   Problem: Activity: Goal: Interest or engagement in activities will improve Outcome: Progressing Goal: Sleeping patterns will improve Outcome: Progressing   Problem: Coping: Goal: Ability to verbalize frustrations and anger appropriately will improve Outcome: Progressing Goal: Ability to demonstrate self-control will improve Outcome: Progressing   Problem: Health Behavior/Discharge Planning: Goal: Identification of resources available to assist in meeting health care needs will improve Outcome: Progressing Goal: Compliance with treatment plan for underlying cause of condition will improve Outcome: Progressing   Problem: Physical Regulation: Goal: Ability to maintain clinical measurements within normal limits will improve Outcome: Progressing   Problem: Safety: Goal: Periods of time without injury will increase Outcome: Progressing   Problem: Activity: Goal: Interest or engagement in leisure activities will improve Outcome: Progressing Goal: Imbalance in normal sleep/wake cycle will improve Outcome: Progressing   Problem: Education: Goal: Utilization of techniques to improve thought processes will improve Outcome: Progressing Goal: Knowledge of the prescribed therapeutic regimen will improve Outcome: Progressing   Problem: Coping: Goal: Coping ability will improve Outcome: Progressing Goal: Will verbalize feelings Outcome: Progressing   Problem: Health Behavior/Discharge Planning: Goal: Ability  to make decisions will improve Outcome: Progressing Goal: Compliance with therapeutic regimen will improve Outcome: Progressing   Problem: Role Relationship: Goal: Will demonstrate positive changes in social behaviors and relationships Outcome: Progressing   Problem: Safety: Goal: Ability to disclose and discuss suicidal ideas will improve Outcome: Progressing Goal: Ability to identify and utilize support systems that promote safety will improve Outcome: Progressing   Problem: Self-Concept: Goal: Will verbalize positive feelings about self Outcome: Progressing Goal: Level of anxiety will decrease Outcome: Progressing   Problem: Education: Goal: Ability to make informed decisions regarding treatment will improve Outcome: Progressing   Problem: Coping: Goal: Coping ability will improve Outcome: Progressing   Problem: Health Behavior/Discharge Planning: Goal: Identification of resources available to assist in meeting health care needs will improve Outcome: Progressing   Problem: Medication: Goal: Compliance with prescribed medication regimen will improve Outcome: Progressing   Problem: Self-Concept: Goal: Ability to disclose and discuss suicidal ideas will improve Outcome: Progressing Goal: Will verbalize positive feelings about self Outcome: Progressing

## 2020-12-21 NOTE — [facility] Suicide Risk Assessment (Signed)
[facility] INPATIENT:  Family/Significant Other Suicide Prevention Education  Suicide Prevention Education:  Contact Attempts: Bladyn Tipps, mother, [phone]  has been identified by the patient as the family member/significant other with whom the patient will be residing, and identified as the person(s) who will aid the patient in the event of a mental health crisis.  With written consent from the patient, two attempts were made to provide suicide prevention education, prior to and/or following the patient's discharge.  We were unsuccessful in providing suicide prevention education.  A suicide education pamphlet was given to the patient to share with family/significant other.  Date and time of first attempt: 12/21/20 at 3:42PM Date and time of second attempt: Second attempt is needed.  CSW left HIPAA compliant voicemail.  Harden Mo 12/21/2020, 3:41 PM

## 2020-12-21 NOTE — Plan of Care (Signed)
Patient endorses anxiety and depression   Problem: Education: Goal: Emotional status will improve Outcome: Not Progressing Goal: Mental status will improve Outcome: Not Progressing   

## 2020-12-22 DIAGNOSIS — F333 Major depressive disorder, recurrent, severe with psychotic symptoms: Secondary | ICD-10-CM | POA: Diagnosis not present

## 2020-12-22 LAB — LIPID PANEL
Cholesterol: 218 mg/dL — ABNORMAL HIGH (ref 0–200)
HDL: 46 mg/dL (ref >40)
LDL Cholesterol: 160 mg/dL — ABNORMAL HIGH (ref 0–99)
Total CHOL/HDL Ratio: 4.7 RATIO
Triglycerides: 60 mg/dL (ref <150)
VLDL: 12 mg/dL (ref 0–40)

## 2020-12-22 LAB — HEMOGLOBIN A1C
Hgb A1c MFr Bld: 5 % (ref 4.8–5.6)
Mean Plasma Glucose: 96.8 mg/dL

## 2020-12-22 MED ORDER — TRAZODONE HCL 100 MG PO TABS
100.0000 mg | ORAL_TABLET | Freq: Every day | ORAL | Status: DC | PRN
  Administered 2020-12-22: 100 mg via ORAL
  Filled 2020-12-22 (×2): qty 1

## 2020-12-22 NOTE — Plan of Care (Signed)
Pt rates depression 4/10 and anxiety 6/10. Pt denies SI, HI and AVH. Pt was educated on care plan and verbalizes understanding. Torrie Mayers RN Problem: Education: Goal: Knowledge of [facility] Education information/materials will improve Outcome: Progressing Goal: Emotional status will improve Outcome: Progressing Goal: Mental status will improve Outcome: Progressing Goal: Verbalization of understanding the information provided will improve Outcome: Progressing   Problem: Activity: Goal: Interest or engagement in activities will improve Outcome: Progressing Goal: Sleeping patterns will improve Outcome: Progressing   Problem: Coping: Goal: Ability to verbalize frustrations and anger appropriately will improve Outcome: Progressing Goal: Ability to demonstrate self-control will improve Outcome: Progressing   Problem: Health Behavior/Discharge Planning: Goal: Identification of resources available to assist in meeting health care needs will improve Outcome: Progressing Goal: Compliance with treatment plan for underlying cause of condition will improve Outcome: Progressing   Problem: Physical Regulation: Goal: Ability to maintain clinical measurements within normal limits will improve Outcome: Progressing   Problem: Safety: Goal: Periods of time without injury will increase Outcome: Progressing   Problem: Education: Goal: Utilization of techniques to improve thought processes will improve Outcome: Progressing Goal: Knowledge of the prescribed therapeutic regimen will improve Outcome: Progressing   Problem: Activity: Goal: Interest or engagement in leisure activities will improve Outcome: Progressing Goal: Imbalance in normal sleep/wake cycle will improve Outcome: Progressing   Problem: Coping: Goal: Coping ability will improve Outcome: Progressing Goal: Will verbalize feelings Outcome: Progressing   Problem: Health Behavior/Discharge Planning: Goal: Ability  to make decisions will improve Outcome: Progressing Goal: Compliance with therapeutic regimen will improve Outcome: Progressing   Problem: Role Relationship: Goal: Will demonstrate positive changes in social behaviors and relationships Outcome: Progressing   Problem: Safety: Goal: Ability to disclose and discuss suicidal ideas will improve Outcome: Progressing Goal: Ability to identify and utilize support systems that promote safety will improve Outcome: Progressing   Problem: Self-Concept: Goal: Will verbalize positive feelings about self Outcome: Progressing Goal: Level of anxiety will decrease Outcome: Progressing   Problem: Education: Goal: Ability to make informed decisions regarding treatment will improve Outcome: Progressing   Problem: Coping: Goal: Coping ability will improve Outcome: Progressing   Problem: Health Behavior/Discharge Planning: Goal: Identification of resources available to assist in meeting health care needs will improve Outcome: Progressing   Problem: Medication: Goal: Compliance with prescribed medication regimen will improve Outcome: Progressing   Problem: Self-Concept: Goal: Ability to disclose and discuss suicidal ideas will improve Outcome: Progressing Goal: Will verbalize positive feelings about self Outcome: Progressing

## 2020-12-22 NOTE — Progress Notes (Signed)
Recreation Therapy Notes   Date: 12/22/2020   Time: 9:30 am   Location: Craft room     Behavioral response: N/A   Intervention Topic: Decision Making    Discussion/Intervention: Patient did not attend group.   Clinical Observations/Feedback:  Patient did not attend group.   Carmela Piechowski LRT/CTRS        Tommie Dejoseph 12/22/2020 11:13 AM

## 2020-12-22 NOTE — [facility] Group Notes (Signed)
LCSW Group Therapy Note  12/22/2020 2:01 PM  Type of Therapy/Topic:  Group Therapy:  Balance in Life  Participation Level:  Did Not Attend  Description of Group:    This group will address the concept of balance and how it feels and looks when one is unbalanced. Patients will be encouraged to process areas in their lives that are out of balance and identify reasons for remaining unbalanced. Facilitators will guide patients in utilizing problem-solving interventions to address and correct the stressor making their life unbalanced. Understanding and applying boundaries will be explored and addressed for obtaining and maintaining a balanced life. Patients will be encouraged to explore ways to assertively make their unbalanced needs known to significant others in their lives, using other group members and facilitator for support and feedback.  Therapeutic Goals: 1. Patient will identify two or more emotions or situations they have that consume much of in their lives. 2. Patient will identify signs/triggers that life has become out of balance:  3. Patient will identify two ways to set boundaries in order to achieve balance in their lives:  4. Patient will demonstrate ability to communicate their needs through discussion and/or role plays  Summary of Patient Progress:      Therapeutic Modalities:   Cognitive Behavioral Therapy Solution-Focused Therapy Assertiveness Training  Penni Homans MSW, LCSW 12/22/2020 2:01 PM

## 2020-12-22 NOTE — Progress Notes (Signed)
[facility] MD Progress Note  12/22/2020 12:37 PM DHRUV CHRISTINA  MRN:  [id number] Subjective:  20 year old male with history of MDD and OCD presenting with suicidal ideations and recent ingestion of Lexparo in suicide attempt. No acute events overnight, medication compliant, performing ADLs.  Patient seen one-on-one at bedside today. He was reporting that he felt "okay" today. He does note anxiety over the new environment of hospital. He reports sleeping and eating fine. He continues to have some feelings of worthless today. He initially denies auditory hallucinations, but then states that they are there. He also endorses disturbing intrusive thoughts of voices telling him to kill himself or that he is worthless. These thoughts come almost 6 hours of the day. He then spends a great amount of the day trying to think of phrases or happy memories to make the intrusive thoughts go away. He denies homicidal ideations and visual hallucinations. Reviewed his diagnosis, current medications, and potential side effects.  Principal Problem: MDD (major depressive disorder), recurrent episode, severe (HCC) Diagnosis: Principal Problem:   MDD (major depressive disorder), recurrent episode, severe (HCC) Active Problems:   Mixed obsessional thoughts and acts  Total Time spent with patient: 30 minutes  Past Psychiatric History: He had a past psychiatric history of OCD since middle school, MDD for 4 years. Previously treated with Lexapro and Buspar. Denies any other past medications. One suicide attempt via ingestion of 5 tablets of Lexapro. No previous hospitalizations.    Past Medical History:  Past Medical History:  Diagnosis Date  . ADHD (attention deficit hyperactivity disorder)   . Allergy     Past Surgical History:  Procedure Laterality Date  . TONSILLECTOMY AND ADENOIDECTOMY     Family History:  Family History  Problem Relation Age of Onset  . Colon polyps Mother   . Stroke Maternal Uncle   . Heart  disease Maternal Grandfather   . Hyperlipidemia Maternal Grandfather   . Stroke Maternal Grandfather    Family Psychiatric  History: Maternal grandmother,unknown diagnosis and brother-mild depression Social History:  Social History   Substance and Sexual Activity  Alcohol Use No     Social History   Substance and Sexual Activity  Drug Use No    Social History   Socioeconomic History  . Marital status: Single    Spouse name: Not on file  . Number of children: Not on file  . Years of education: Not on file  . Highest education level: Not on file  Occupational History  . Not on file  Tobacco Use  . Smoking status: Never Smoker  . Smokeless tobacco: Never Used  Vaping Use  . Vaping Use: Some days  Substance and Sexual Activity  . Alcohol use: No  . Drug use: No  . Sexual activity: Not on file  Other Topics Concern  . Not on file  Social History Narrative  . Not on file   Social Determinants of Health   Financial Resource Strain: Not on file  Food Insecurity: Not on file  Transportation Needs: Not on file  Physical Activity: Not on file  Stress: Not on file  Social Connections: Not on file   Additional Social History:                         Sleep: Fair  Appetite:  Fair  Current Medications: Current Facility-Administered Medications  Medication Dose Route Frequency Provider Last Rate Last Admin  . acetaminophen (TYLENOL) tablet 650 mg  650 mg  Oral Q6H PRN Gillermo Murdoch, NP      . alum & mag hydroxide-simeth (MAALOX/MYLANTA) 200-200-20 MG/5ML suspension 30 mL  30 mL Oral Q4H PRN Gillermo Murdoch, NP      . ARIPiprazole (ABILIFY) tablet 10 mg  10 mg Oral Daily Jesse Sans, MD   10 mg at 12/22/20 0745  . busPIRone (BUSPAR) tablet 10 mg  10 mg Oral BID Gillermo Murdoch, NP   10 mg at 12/22/20 0745  . escitalopram (LEXAPRO) tablet 20 mg  20 mg Oral Daily Jesse Sans, MD   20 mg at 12/22/20 0745  . hydrOXYzine (ATARAX/VISTARIL)  tablet 25 mg  25 mg Oral Q6H PRN Gillermo Murdoch, NP   25 mg at 12/21/20 1706  . magnesium hydroxide (MILK OF MAGNESIA) suspension 30 mL  30 mL Oral Daily PRN Gillermo Murdoch, NP        Lab Results:  Results for orders placed or performed during the hospital encounter of 12/21/20 (from the past 48 hour(s))  Lipid panel     Status: Abnormal   Collection Time: 12/22/20  7:27 AM  Result Value Ref Range   Cholesterol 218 (H) 0 - 200 mg/dL   Triglycerides 60 <756 mg/dL   HDL 46 >43 mg/dL   Total CHOL/HDL Ratio 4.7 RATIO   VLDL 12 0 - 40 mg/dL   LDL Cholesterol 329 (H) 0 - 99 mg/dL    Comment:        Total Cholesterol/HDL:CHD Risk Coronary Heart Disease Risk Table                     Men   Women  1/2 Average Risk   3.4   3.3  Average Risk       5.0   4.4  2 X Average Risk   9.6   7.1  3 X Average Risk  23.4   11.0        Use the calculated Patient Ratio above and the CHD Risk Table to determine the patient's CHD Risk.        ATP III CLASSIFICATION (LDL):  <100     mg/dL   Optimal  518-841  mg/dL   Near or Above                    Optimal  130-159  mg/dL   Borderline  660-630  mg/dL   High  >160     mg/dL   Very High Performed at [facility], [address] [place], [place] [address]   Hemoglobin A1c     Status: None   Collection Time: 12/22/20  7:27 AM  Result Value Ref Range   Hgb A1c MFr Bld 5.0 4.8 - 5.6 %    Comment: (NOTE) Pre diabetes:          5.7%-6.4%  Diabetes:              >6.4%  Glycemic control for   <7.0% adults with diabetes    Mean Plasma Glucose 96.8 mg/dL    Comment: Performed at [facility], [address] [place], [place] [address]    Blood Alcohol level:  Lab Results  Component Value Date   [facility] <10 12/20/2020    Metabolic Disorder Labs: Lab Results  Component Value Date   HGBA1C 5.0 12/22/2020   MPG 96.8 12/22/2020   No results found for: PROLACTIN Lab Results  Component Value Date   CHOL 218 (H)  12/22/2020   TRIG 60 12/22/2020   HDL 46 12/22/2020   CHOLHDL 4.7 12/22/2020   VLDL 12 12/22/2020   LDLCALC 160 (H) 12/22/2020    Physical Findings: AIMS:  , ,  ,  ,    CIWA:    COWS:     Musculoskeletal: Strength & Muscle Tone: within normal limits Gait & Station: normal Patient leans: N/A  Psychiatric Specialty Exam: Physical Exam Vitals and nursing note reviewed.  Constitutional:      Appearance: Normal appearance.  HENT:     Head: Normocephalic and atraumatic.     Right Ear: External ear normal.     Left Ear: External ear normal.     Nose: Nose normal.     Mouth/Throat:     Mouth: Mucous membranes are moist.     Pharynx: Oropharynx is clear.  Eyes:     Extraocular Movements: Extraocular movements intact.     Conjunctiva/sclera: Conjunctivae normal.     Pupils: Pupils are equal, round, and reactive to light.  Cardiovascular:     Rate and Rhythm: Normal rate.     Pulses: Normal pulses.  Pulmonary:     Effort: Pulmonary effort is normal.     Breath sounds: Normal breath sounds.  Abdominal:     General: Abdomen is flat.     Palpations: Abdomen is soft.  Musculoskeletal:        General: No swelling. Normal range of motion.     Cervical back: Normal range of motion and neck supple.  Skin:    General: Skin is warm and dry.  Neurological:     General: No focal deficit present.     Mental Status: He is alert.     Cranial Nerves: No cranial nerve deficit.  Psychiatric:        Attention and Perception: He is inattentive. He perceives auditory hallucinations.        Mood and Affect: Mood is anxious.        Speech: Speech is delayed.        Behavior: Behavior is withdrawn.        Thought Content: Thought content is paranoid and delusional.        Cognition and Memory: Cognition is impaired. Memory is impaired.        Judgment: Judgment is inappropriate.     Review of Systems  Constitutional: Negative for chills and fatigue.  HENT: Negative for rhinorrhea and  sore throat.   Eyes: Negative for photophobia and visual disturbance.  Respiratory: Negative for cough and shortness of breath.   Cardiovascular: Negative for chest pain and palpitations.  Gastrointestinal: Negative for constipation, diarrhea, nausea and vomiting.  Endocrine: Negative for cold intolerance and heat intolerance.  Genitourinary: Negative for difficulty urinating and dysuria.  Musculoskeletal: Negative for arthralgias and myalgias.  Skin: Negative for rash and wound.  Allergic/Immunologic: Negative for food allergies and immunocompromised state.  Neurological: Negative for dizziness and headaches.  Hematological: Negative for adenopathy. Does not bruise/bleed easily.  Psychiatric/Behavioral: Positive for dysphoric mood, hallucinations and suicidal ideas. The patient is nervous/anxious.     Blood pressure 126/71, pulse 63, temperature 97.8 F (36.6 C), temperature source Oral, resp. rate 18, height 5\' 10"  (1.778 m), weight 58.1 kg, SpO2 98 %.Body mass index is 18.37 kg/m.  General Appearance: Fairly Groomed and Guarded  Eye Contact:  Good  Speech:  Blocked  Volume:  Normal  Mood:  Anxious  Affect:  Congruent  Thought Process:  Disorganized  Orientation:  Full (  Time, Place, and Person)  Thought Content:  Hallucinations: Auditory, Obsessions and Paranoid Ideation  Suicidal Thoughts:  Yes.  without intent/plan  Homicidal Thoughts:  No  Memory:  Immediate;   Good  Judgement:  Impaired  Insight:  Fair  Psychomotor Activity:  Restlessness  Concentration:  Concentration: Poor and Attention Span: Poor  Recall:  [employer] of Knowledge:  Fair  Language:  Fair  Akathisia:  Negative  Handed:  Right  AIMS (if indicated):     Assets:  Communication Skills Desire for Improvement Financial Resources/Insurance Housing Physical Health Social Support  ADL's:  Intact  Cognition:  Impaired,  Mild  Sleep:  Number of Hours: 5.75     Treatment Plan Summary: Daily contact with  patient to assess and evaluate symptoms and progress in treatment and Medication management PLAN OF CARE:20 year old male with history of MDD and OCD presenting with suicidal ideations and recent ingestion of Lexparo in suicide attempt. Reporting numerous intrusive thoughts causing decreased concentration, distress, and anxiety. Presentation consistent with exacerbation of OCD and MDD, recurrent, severe with psychotic features. Rule out bipolar II disorder. Discontinued Wellbutrin 150 mg daily. Continued Lexapro 20 mg daily, and continue Abilify 10 mg daily for psychosis and mood.  Jesse Sans, MD 12/22/2020, 12:37 PM

## 2020-12-22 NOTE — Plan of Care (Signed)
  Problem: Education: Goal: Knowledge of [place] [facility] Education information/materials will improve Outcome: Progressing Goal: Emotional status will improve Outcome: Progressing Goal: Mental status will improve Outcome: Progressing   Problem: Activity: Goal: Interest or engagement in activities will improve Outcome: Progressing   Problem: Coping: Goal: Ability to demonstrate self-control will improve Outcome: Progressing   Problem: Safety: Goal: Periods of time without injury will increase Outcome: Progressing

## 2020-12-22 NOTE — Progress Notes (Signed)
Patient presents with sad, flat affect but brightens on approach. Denies any sI, HI, AVH. C/o insomnia. Prn ordered and given, waiting effectiveness. Pt noted in dayroom socializing, and playing games with peers.  Encouragement and support provided. Safety checks maintained. Medications given as prescribed. Pt receptive and remains safe on unit with q 15 min checks.

## 2020-12-22 NOTE — Progress Notes (Signed)
Patient minimal with staff this evening during assessment denying SI/HI/AVH. Patient endorses anxiety. Patient isolative to his room this evening. Patient didn't have any medications scheduled and didn't request anything PRN. Patient given education, support, and encouragement to be active in his treatment plan. Patient being monitored Q 15 minutes for safety per unit protocol. Pt remains safe on the unit.

## 2020-12-23 DIAGNOSIS — F333 Major depressive disorder, recurrent, severe with psychotic symptoms: Secondary | ICD-10-CM | POA: Diagnosis not present

## 2020-12-23 MED ORDER — ARIPIPRAZOLE 5 MG PO TABS
15.0000 mg | ORAL_TABLET | Freq: Every day | ORAL | Status: DC
  Administered 2020-12-24 – 2020-12-27 (×4): 15 mg via ORAL
  Filled 2020-12-23 (×5): qty 1

## 2020-12-23 NOTE — Plan of Care (Signed)
Patient has been  mostly in bed but out for meals and medications. Pleasant on approach. Denied thoughts of self-harm. Denied  hallucinations. Currently out of bed and eating lunch and has no sign of distress. Encouragements and support provided. Safety monitored as recommended.

## 2020-12-23 NOTE — [facility] Counselor (Addendum)
CSW spoke with Merla Riches with [facility].  She informed that there is a waitlist and she is unsure of patient being accepted or when he could arrive.  She reports that the patient must be fully vaccinated to attend.    She reports that she needs the following faxed to [phone], attention Merla Riches All labs, H & P, Admission note, all psych notes  CSW faxed materials and received confirmation.  She scheduled a call for an interview with the patient on 12/26/2020 at 1, CSW is to call office at [phone].  Penni Homans, MSW, LCSW 12/23/2020 3:40 PM

## 2020-12-23 NOTE — [facility] Group Notes (Signed)
LCSW Group Therapy Note  12/23/2020 2:08 PM  Type of Therapy and Topic:  Group Therapy:  Feelings around Relapse and Recovery  Participation Level:  Active   Description of Group:    Patients in this group will discuss emotions they experience before and after a relapse. They will process how experiencing these feelings, or avoidance of experiencing them, relates to having a relapse. Facilitator will guide patients to explore emotions they have related to recovery. Patients will be encouraged to process which emotions are more powerful. They will be guided to discuss the emotional reaction significant others in their lives may have to their relapse or recovery. Patients will be assisted in exploring ways to respond to the emotions of others without this contributing to a relapse.  Therapeutic Goals: 1. Patient will identify two or more emotions that lead to a relapse for them 2. Patient will identify two emotions that result when they relapse 3. Patient will identify two emotions related to recovery 4. Patient will demonstrate ability to communicate their needs through discussion and/or role plays   Summary of Patient Progress: Patient was present for the entirety of the group session. Patient was an active listener and participated in the topic of discussion. Patient participated in group introduction and ice-breaker. Patient contributed to the topic of discussion; shared that he resonated with other statements of "hitting rock bottom."    Therapeutic Modalities:   Cognitive Behavioral Therapy Solution-Focused Therapy Assertiveness Training Relapse Prevention Therapy   Gwenevere Ghazi, MSW, [place], Bridget Hartshorn 12/23/2020 2:08 PM

## 2020-12-23 NOTE — Progress Notes (Signed)
[facility] MD Progress Note  12/23/2020 12:49 PM Christian Bond  MRN:  [id number] Subjective:  20 year old male with history of MDD and OCD presenting with suicidal ideations and recent ingestion of Lexparo in suicide attempt. No acute events overnight, medication compliant, performing ADLs.  Patient seen one-on-one at bedside today. He was reporting that he felt "okay" today. He continues to say he was somewhat anxious due to hospital environment. However, he feels he is somewhat improved. He notes that his intrusive thoughts are less intense, and he is able to think of other things more easily. He continues to report some intrusive thoughts about kill himself, or hurting people he loves. However, he notes that they are disturbing because he has no actual intent or desire to harm other people. He feels he can "come back to reality" now, but has difficulty explaining what he means. He continues to have pauses in speech and visible anxiety on exam, however he is able to give more full sentences without pausing. He also only needs me to repeat a question one time during interview today. This is improved from admission where he would fairly consistently lose his train of thought, and have to ask me to repeat the question. He denies homicidal ideations, suicidal ideations, and visual hallucinations. He denies any medication side effects. Reviewed his diagnosis, current medications, and potential side effects.  Principal Problem: MDD (major depressive disorder), recurrent episode, severe (HCC) Diagnosis: Principal Problem:   MDD (major depressive disorder), recurrent episode, severe (HCC) Active Problems:   Mixed obsessional thoughts and acts  Total Time spent with patient: 30 minutes  Past Psychiatric History: He had a past psychiatric history of OCD since middle school, MDD for 4 years. Previously treated with Lexapro and Buspar. Denies any other past medications. One suicide attempt via ingestion of 5 tablets  of Lexapro. No previous hospitalizations.    Past Medical History:  Past Medical History:  Diagnosis Date  . ADHD (attention deficit hyperactivity disorder)   . Allergy     Past Surgical History:  Procedure Laterality Date  . TONSILLECTOMY AND ADENOIDECTOMY     Family History:  Family History  Problem Relation Age of Onset  . Colon polyps Mother   . Stroke Maternal Uncle   . Heart disease Maternal Grandfather   . Hyperlipidemia Maternal Grandfather   . Stroke Maternal Grandfather    Family Psychiatric  History: Maternal grandmother,unknown diagnosis and brother-mild depression Social History:  Social History   Substance and Sexual Activity  Alcohol Use No     Social History   Substance and Sexual Activity  Drug Use No    Social History   Socioeconomic History  . Marital status: Single    Spouse name: Not on file  . Number of children: Not on file  . Years of education: Not on file  . Highest education level: Not on file  Occupational History  . Not on file  Tobacco Use  . Smoking status: Never Smoker  . Smokeless tobacco: Never Used  Vaping Use  . Vaping Use: Some days  Substance and Sexual Activity  . Alcohol use: No  . Drug use: No  . Sexual activity: Not on file  Other Topics Concern  . Not on file  Social History Narrative  . Not on file   Social Determinants of Health   Financial Resource Strain: Not on file  Food Insecurity: Not on file  Transportation Needs: Not on file  Physical Activity: Not on file  Stress:  Not on file  Social Connections: Not on file   Additional Social History:                         Sleep: Fair  Appetite:  Fair  Current Medications: Current Facility-Administered Medications  Medication Dose Route Frequency Provider Last Rate Last Admin  . acetaminophen (TYLENOL) tablet 650 mg  650 mg Oral Q6H PRN Gillermo Murdoch, NP      . alum & mag hydroxide-simeth (MAALOX/MYLANTA) 200-200-20 MG/5ML  suspension 30 mL  30 mL Oral Q4H PRN Gillermo Murdoch, NP      . Melene Muller ON 12/24/2020] ARIPiprazole (ABILIFY) tablet 15 mg  15 mg Oral Daily Les Pou M, MD      . busPIRone (BUSPAR) tablet 10 mg  10 mg Oral BID Gillermo Murdoch, NP   10 mg at 12/23/20 9735  . escitalopram (LEXAPRO) tablet 20 mg  20 mg Oral Daily Jesse Sans, MD   20 mg at 12/23/20 3299  . hydrOXYzine (ATARAX/VISTARIL) tablet 25 mg  25 mg Oral Q6H PRN Gillermo Murdoch, NP   25 mg at 12/22/20 1711  . magnesium hydroxide (MILK OF MAGNESIA) suspension 30 mL  30 mL Oral Daily PRN Gillermo Murdoch, NP      . traZODone (DESYREL) tablet 100 mg  100 mg Oral Daily PRN Clapacs, Jackquline Denmark, MD   100 mg at 12/22/20 2059    Lab Results:  Results for orders placed or performed during the hospital encounter of 12/21/20 (from the past 48 hour(s))  Lipid panel     Status: Abnormal   Collection Time: 12/22/20  7:27 AM  Result Value Ref Range   Cholesterol 218 (H) 0 - 200 mg/dL   Triglycerides 60 <242 mg/dL   HDL 46 >68 mg/dL   Total CHOL/HDL Ratio 4.7 RATIO   VLDL 12 0 - 40 mg/dL   LDL Cholesterol 341 (H) 0 - 99 mg/dL    Comment:        Total Cholesterol/HDL:CHD Risk Coronary Heart Disease Risk Table                     Men   Women  1/2 Average Risk   3.4   3.3  Average Risk       5.0   4.4  2 X Average Risk   9.6   7.1  3 X Average Risk  23.4   11.0        Use the calculated Patient Ratio above and the CHD Risk Table to determine the patient's CHD Risk.        ATP III CLASSIFICATION (LDL):  <100     mg/dL   Optimal  962-229  mg/dL   Near or Above                    Optimal  130-159  mg/dL   Borderline  798-921  mg/dL   High  >194     mg/dL   Very High Performed at [facility], [address] [place], [place] [address]   Hemoglobin A1c     Status: None   Collection Time: 12/22/20  7:27 AM  Result Value Ref Range   Hgb A1c MFr Bld 5.0 4.8 - 5.6 %    Comment: (NOTE) Pre diabetes:           5.7%-6.4%  Diabetes:              >  6.4%  Glycemic control for   <7.0% adults with diabetes    Mean Plasma Glucose 96.8 mg/dL    Comment: Performed at [facility], [address], [place], [place] [address]    Blood Alcohol level:  Lab Results  Component Value Date   ETH <10 12/20/2020    Metabolic Disorder Labs: Lab Results  Component Value Date   HGBA1C 5.0 12/22/2020   MPG 96.8 12/22/2020   No results found for: PROLACTIN Lab Results  Component Value Date   CHOL 218 (H) 12/22/2020   TRIG 60 12/22/2020   HDL 46 12/22/2020   CHOLHDL 4.7 12/22/2020   VLDL 12 12/22/2020   LDLCALC 160 (H) 12/22/2020    Physical Findings: AIMS:  , ,  ,  ,    CIWA:    COWS:     Musculoskeletal: Strength & Muscle Tone: within normal limits Gait & Station: normal Patient leans: N/A  Psychiatric Specialty Exam: Physical Exam Vitals and nursing note reviewed.  Constitutional:      Appearance: Normal appearance.  HENT:     Head: Normocephalic and atraumatic.     Right Ear: External ear normal.     Left Ear: External ear normal.     Nose: Nose normal.     Mouth/Throat:     Mouth: Mucous membranes are moist.     Pharynx: Oropharynx is clear.  Eyes:     Extraocular Movements: Extraocular movements intact.     Conjunctiva/sclera: Conjunctivae normal.     Pupils: Pupils are equal, round, and reactive to light.  Cardiovascular:     Rate and Rhythm: Normal rate.     Pulses: Normal pulses.  Pulmonary:     Effort: Pulmonary effort is normal.     Breath sounds: Normal breath sounds.  Abdominal:     General: Abdomen is flat.     Palpations: Abdomen is soft.  Musculoskeletal:        General: No swelling. Normal range of motion.     Cervical back: Normal range of motion and neck supple.  Skin:    General: Skin is warm and dry.  Neurological:     General: No focal deficit present.     Mental Status: He is alert.     Cranial Nerves: No cranial nerve deficit.  Psychiatric:         Attention and Perception: He is inattentive. He perceives auditory hallucinations.        Mood and Affect: Mood is anxious.        Speech: Speech is delayed.        Behavior: Behavior is withdrawn.        Thought Content: Thought content is paranoid and delusional.        Cognition and Memory: Cognition is impaired. Memory is impaired.        Judgment: Judgment is inappropriate.     Review of Systems  Constitutional: Negative for chills and fatigue.  HENT: Negative for rhinorrhea and sore throat.   Eyes: Negative for photophobia and visual disturbance.  Respiratory: Negative for cough and shortness of breath.   Cardiovascular: Negative for chest pain and palpitations.  Gastrointestinal: Negative for constipation, diarrhea, nausea and vomiting.  Endocrine: Negative for cold intolerance and heat intolerance.  Genitourinary: Negative for difficulty urinating and dysuria.  Musculoskeletal: Negative for arthralgias and myalgias.  Skin: Negative for rash and wound.  Allergic/Immunologic: Negative for food allergies and immunocompromised state.  Neurological: Negative for dizziness and headaches.  Hematological:  Negative for adenopathy. Does not bruise/bleed easily.  Psychiatric/Behavioral: Positive for dysphoric mood, hallucinations and suicidal ideas. The patient is nervous/anxious.     Blood pressure 131/83, pulse 82, temperature 98.2 F (36.8 C), temperature source Oral, resp. rate 18, height 5\' 10"  (1.778 m), weight 58.1 kg, SpO2 98 %.Body mass index is 18.37 kg/m.  General Appearance: Fairly Groomed and Guarded  Eye Contact:  Good  Speech:  Blocked  Volume:  Normal  Mood:  Anxious  Affect:  Congruent  Thought Process:  Disorganized  Orientation:  Full (Time, Place, and Person)  Thought Content:  Obsessions and Paranoid Ideation  Suicidal Thoughts:  Yes.  without intent/plan  Homicidal Thoughts:  No  Memory:  Immediate;   Good  Judgement:  Impaired  Insight:  Fair   Psychomotor Activity:  Restlessness  Concentration:  Concentration: Fair and Attention Span: Poor  Recall:  of Knowledge:  Fair  Language:  Fair  Akathisia:  Negative  Handed:  Right  AIMS (if indicated):     Assets:  Communication Skills Desire for Improvement Financial Resources/Insurance Housing Physical Health Social Support  ADL's:  Intact  Cognition:  Impaired,  Mild  Sleep:  Number of Hours: 7.75     Treatment Plan Summary: Daily contact with patient to assess and evaluate symptoms and progress in treatment and Medication management PLAN OF CARE:20 year old male with history of MDD and OCD presenting with suicidal ideations and recent ingestion of Lexparo in suicide attempt. Reporting numerous intrusive thoughts causing decreased concentration, distress, and anxiety. Presentation consistent with exacerbation of OCD and MDD, recurrent, severe with psychotic features. Rule out bipolar II disorder. Discontinued Wellbutrin 150 mg daily. Continued Lexapro 20 mg daily, and increase Abilify 15 mg daily for psychosis and mood.  12, MD 12/23/2020, 12:49 PM

## 2020-12-23 NOTE — Progress Notes (Signed)
Recreation Therapy Notes  Date: 12/23/2020  Time: 9:30 am   Location: Craft room     Behavioral response: N/A   Intervention Topic: Time Management   Discussion/Intervention: Patient did not attend group.   Clinical Observations/Feedback:  Patient did not attend group.   Dewie Ahart LRT/CTRS        Demira Gwynne 12/23/2020 10:39 AM

## 2020-12-23 NOTE — Progress Notes (Signed)
Recreation Therapy Notes  INPATIENT RECREATION THERAPY ASSESSMENT  Patient Details Name: Christian Bond MRN: [id number] DOB: 05/15/2001 Today's Date: 12/23/2020       Information Obtained From: Patient  Able to Participate in Assessment/Interview: Yes  Patient Presentation: Responsive,Withdrawn  Reason for Admission (Per Patient): Active Symptoms,Suicidal Ideation  Patient Stressors:    Coping Skills:   Music  Leisure Interests (2+):  Music - Listen,Nature - Hiking  Frequency of Recreation/Participation: Monthly  Awareness of Community Resources:  Yes  Community Resources:  Other (Comment) ([employer])  Current Use:    If no, Barriers?:    Expressed Interest in [employer] Information:    [place] of Residence:  [place]  Patient Main Form of Transportation: Car  Patient Strengths:  N/A  Patient Identified Areas of Improvement:  N/A  Patient Goal for Hospitalization:  Outpatient  Current SI (including self-harm):  No  Current HI:  No  Current AVH: No  Staff Intervention Plan: Collaborate with Interdisciplinary Treatment Team,Group Attendance  Consent to Intern Participation: N/A  Siana Panameno 12/23/2020, 12:57 PM

## 2020-12-23 NOTE — BHH Counselor (Signed)
CSW called [facility] to initiate the referral process for thieir program. CSW left a HIPAA compliant voicemail.    At the time of this update no return call has been received.  Penni Homans, MSW, LCSW 12/23/2020 12:58 PM

## 2020-12-24 NOTE — Progress Notes (Signed)
[facility] MD Progress Note  12/24/2020 11:23 AM Christian Bond  MRN:  [id number] Subjective:  20 year old male with history of MDD and OCD presenting with suicidal ideations and recent ingestion of Lexparo in suicide attempt.    Patient is being seen for the first time today. Tells me he is feeling "good,"  and begins by indicating that his anxiety and depression are minimal. His OCD is a 2 out of 10. Says that the main reason that he came was that other people were judging and fighting with him, and is caused him to be agoraphobic (?). He does not offer additional information regarding this.  No thoughts that are weighing heavily on his mind . No sharing points. He slept well last night. Reports discomfort with compulsions to move. Says that he had that even before he started the Abilify. He seems to be visibly comfortable in his chair. Denies homicidal or suicidal thoughts. There side effects on his medication. Asks when his discharge will be. I informed him Dr. Neale Burly will discuss this with him.   Principal Problem: MDD (major depressive disorder), recurrent episode, severe (HCC) Diagnosis: Principal Problem:   MDD (major depressive disorder), recurrent episode, severe (HCC) Active Problems:   Mixed obsessional thoughts and acts  Total Time spent with patient: 30 minutes  Past Psychiatric History: He had a past psychiatric history of OCD since middle school, MDD for 4 years. Previously treated with Lexapro and Buspar. Denies any other past medications. One suicide attempt via ingestion of 5 tablets of Lexapro. No previous hospitalizations.    Past Medical History:  Past Medical History:  Diagnosis Date  . ADHD (attention deficit hyperactivity disorder)   . Allergy     Past Surgical History:  Procedure Laterality Date  . TONSILLECTOMY AND ADENOIDECTOMY     Family History:  Family History  Problem Relation Age of Onset  . Colon polyps Mother   . Stroke Maternal Uncle   . Heart  disease Maternal Grandfather   . Hyperlipidemia Maternal Grandfather   . Stroke Maternal Grandfather    Family Psychiatric  History: Maternal grandmother,unknown diagnosis and brother-mild depression Social History:  Social History   Substance and Sexual Activity  Alcohol Use No     Social History   Substance and Sexual Activity  Drug Use No    Social History   Socioeconomic History  . Marital status: Single    Spouse name: Not on file  . Number of children: Not on file  . Years of education: Not on file  . Highest education level: Not on file  Occupational History  . Not on file  Tobacco Use  . Smoking status: Never Smoker  . Smokeless tobacco: Never Used  Vaping Use  . Vaping Use: Some days  Substance and Sexual Activity  . Alcohol use: No  . Drug use: No  . Sexual activity: Not on file  Other Topics Concern  . Not on file  Social History Narrative  . Not on file   Social Determinants of Health   Financial Resource Strain: Not on file  Food Insecurity: Not on file  Transportation Needs: Not on file  Physical Activity: Not on file  Stress: Not on file  Social Connections: Not on file   Additional Social History:                         Sleep: Fair  Appetite:  Fair  Current Medications: Current Facility-Administered Medications  Medication  Dose Route Frequency Provider Last Rate Last Admin  . acetaminophen (TYLENOL) tablet 650 mg  650 mg Oral Q6H PRN Gillermo Murdoch, NP      . alum & mag hydroxide-simeth (MAALOX/MYLANTA) 200-200-20 MG/5ML suspension 30 mL  30 mL Oral Q4H PRN Gillermo Murdoch, NP      . ARIPiprazole (ABILIFY) tablet 15 mg  15 mg Oral Daily Jesse Sans, MD   15 mg at 12/24/20 0829  . busPIRone (BUSPAR) tablet 10 mg  10 mg Oral BID Gillermo Murdoch, NP   10 mg at 12/24/20 0830  . escitalopram (LEXAPRO) tablet 20 mg  20 mg Oral Daily Jesse Sans, MD   20 mg at 12/24/20 0830  . hydrOXYzine (ATARAX/VISTARIL)  tablet 25 mg  25 mg Oral Q6H PRN Gillermo Murdoch, NP   25 mg at 12/22/20 1711  . magnesium hydroxide (MILK OF MAGNESIA) suspension 30 mL  30 mL Oral Daily PRN Gillermo Murdoch, NP      . traZODone (DESYREL) tablet 100 mg  100 mg Oral Daily PRN Clapacs, Jackquline Denmark, MD   100 mg at 12/22/20 2059    Lab Results:  No results found for this or any previous visit (from the past 48 hour(s)).  Blood Alcohol level:  Lab Results  Component Value Date   ETH <10 12/20/2020    Metabolic Disorder Labs: Lab Results  Component Value Date   HGBA1C 5.0 12/22/2020   MPG 96.8 12/22/2020   No results found for: PROLACTIN Lab Results  Component Value Date   CHOL 218 (H) 12/22/2020   TRIG 60 12/22/2020   HDL 46 12/22/2020   CHOLHDL 4.7 12/22/2020   VLDL 12 12/22/2020   LDLCALC 160 (H) 12/22/2020    Physical Findings: AIMS:  , ,  ,  ,    CIWA:    COWS:     Musculoskeletal: Strength & Muscle Tone: within normal limits Gait & Station: normal Patient leans: N/A  Psychiatric Specialty Exam:   Review of Systems  Constitutional: Negative for chills and fatigue.  HENT: Negative for rhinorrhea and sore throat.   Eyes: Negative for photophobia and visual disturbance.  Respiratory: Negative for cough and shortness of breath.   Cardiovascular: Negative for chest pain and palpitations.  Gastrointestinal: Negative for constipation, diarrhea, nausea and vomiting.  Endocrine: Negative for cold intolerance and heat intolerance.  Genitourinary: Negative for difficulty urinating and dysuria.  Musculoskeletal: Negative for arthralgias and myalgias.  Skin: Negative for rash and wound.  Allergic/Immunologic: Negative for food allergies and immunocompromised state.  Neurological: Negative for dizziness and headaches.  Hematological: Negative for adenopathy. Does not bruise/bleed easily.  Psychiatric/Behavioral: Positive for dysphoric mood, hallucinations and suicidal ideas. The patient is  nervous/anxious.     Blood pressure 105/61, pulse 61, temperature 98.2 F (36.8 C), temperature source Oral, resp. rate 18, height 5\' 10"  (1.778 m), weight 58.1 kg, SpO2 99 %.Body mass index is 18.37 kg/m.  General Appearance: Fairly Groomed and Guarded  Eye Contact:  Good  Speech:  Blocked  Volume:  Normal  Mood:  good  Affect:  Congruent  Thought Process:  Disorganized  Orientation:  Full (Time, Place, and Person)  Thought Content:  Obsessions and Paranoid Ideation  Suicidal Thoughts:  Yes.  without intent/plan  Homicidal Thoughts:  No  Memory:  Immediate;   Good  Judgement:  good  Insight:  Fair  Psychomotor Activity:  Restlessness  Concentration:  Concentration: Fair and Attention Span: Poor  Recall:  of  Knowledge:  Fair  Language:  Fair  Akathisia:  Negative  Handed:  Right  AIMS (if indicated):     Assets:  Communication Skills Desire for Improvement Financial Resources/Insurance Housing Physical Health Social Support  ADL's:  Intact  Cognition:  Impaired,  Mild  Sleep:  Number of Hours: 6.75     Treatment Plan Summary: Daily contact with patient to assess and evaluate symptoms and progress in treatment and Medication management PLAN OF CARE:20 year old male with history of MDD and OCD presenting with suicidal ideations and recent ingestion of Lexparo in suicide attempt. Reporting numerous intrusive thoughts causing decreased concentration, distress, and anxiety. Presentation consistent with exacerbation of OCD and MDD, recurrent, severe with psychotic features. Rule out bipolar II disorder. Discontinued Wellbutrin 150 mg daily. Continued Lexapro 20 mg daily, and increase Abilify 15 mg daily for psychosis and mood.  12/24/2020 No changes   Reggie Pile, MD 12/24/2020, 11:23 AM

## 2020-12-24 NOTE — BHH Counselor (Signed)
CSW met with patient while completing rounds. Patient reported that he has been feeling less overwhelmed and tolerating the medication well. He also shared that he sees a Teacher, music at [facility] on [address] In [place], [place]. Patient is interesting in outpatient therapy for anxiety and depression. CSW acknowledged request and will follow-up with patient prior to discharge.   Signed:  Durenda Hurt, MSW, [place], LCASA 12/24/2020 2:27 PM

## 2020-12-24 NOTE — Plan of Care (Signed)
  Problem: Education: Goal: Knowledge of [facility] Education information/materials will improve Outcome: Progressing Goal: Emotional status will improve Outcome: Progressing Goal: Mental status will improve Outcome: Progressing Goal: Verbalization of understanding the information provided will improve Outcome: Progressing   Problem: Activity: Goal: Interest or engagement in activities will improve Outcome: Progressing Goal: Sleeping patterns will improve Outcome: Progressing   Problem: Coping: Goal: Ability to verbalize frustrations and anger appropriately will improve Outcome: Progressing Goal: Ability to demonstrate self-control will improve Outcome: Progressing   Problem: Health Behavior/Discharge Planning: Goal: Identification of resources available to assist in meeting health care needs will improve Outcome: Progressing Goal: Compliance with treatment plan for underlying cause of condition will improve Outcome: Progressing   Problem: Physical Regulation: Goal: Ability to maintain clinical measurements within normal limits will improve Outcome: Progressing   Problem: Safety: Goal: Periods of time without injury will increase Outcome: Progressing   Problem: Education: Goal: Utilization of techniques to improve thought processes will improve Outcome: Progressing Goal: Knowledge of the prescribed therapeutic regimen will improve Outcome: Progressing   Problem: Activity: Goal: Interest or engagement in leisure activities will improve Outcome: Progressing Goal: Imbalance in normal sleep/wake cycle will improve Outcome: Progressing   Problem: Coping: Goal: Coping ability will improve Outcome: Progressing Goal: Will verbalize feelings Outcome: Progressing   Problem: Health Behavior/Discharge Planning: Goal: Ability to make decisions will improve Outcome: Progressing Goal: Compliance with therapeutic regimen will improve Outcome: Progressing    Problem: Role Relationship: Goal: Will demonstrate positive changes in social behaviors and relationships Outcome: Progressing   Problem: Safety: Goal: Ability to disclose and discuss suicidal ideas will improve Outcome: Progressing Goal: Ability to identify and utilize support systems that promote safety will improve Outcome: Progressing   Problem: Self-Concept: Goal: Will verbalize positive feelings about self Outcome: Progressing Goal: Level of anxiety will decrease Outcome: Progressing   Problem: Education: Goal: Ability to make informed decisions regarding treatment will improve Outcome: Progressing   Problem: Coping: Goal: Coping ability will improve Outcome: Progressing   Problem: Health Behavior/Discharge Planning: Goal: Identification of resources available to assist in meeting health care needs will improve Outcome: Progressing   Problem: Medication: Goal: Compliance with prescribed medication regimen will improve Outcome: Progressing   Problem: Self-Concept: Goal: Ability to disclose and discuss suicidal ideas will improve Outcome: Progressing Goal: Will verbalize positive feelings about self Outcome: Progressing   

## 2020-12-24 NOTE — Progress Notes (Signed)
Patient remains isolative to room. Denies SI, HI and AVH

## 2020-12-24 NOTE — [facility] Group Notes (Signed)
LCSW Group Therapy Note  12/24/2020 1:36 PM  Type of Therapy and Topic:  Group Therapy: Avoiding Self-Sabotaging and Enabling Behaviors  Participation Level:  Did Not Attend   Description of Group:   In this group, patients will learn how to identify obstacles, self-sabotaging and enabling behaviors, as well as: what are they, why do we do them and what needs these behaviors meet. Discuss unhealthy relationships and how to have positive healthy boundaries with those that sabotage and enable. Explore aspects of self-sabotage and enabling in yourself and how to limit these self-destructive behaviors in everyday life.   Therapeutic Goals: 1. Patient will identify one obstacle that relates to self-sabotage and enabling behaviors 2. Patient will identify one personal self-sabotaging or enabling behavior they did prior to admission 3. Patient will state a plan to change the above identified behavior 4. Patient will demonstrate ability to communicate their needs through discussion and/or role play.   Summary of Patient Progress: Patient did not attend group despite encouraged participation.   Therapeutic Modalities:   Cognitive Behavioral Therapy Person-Centered Therapy Motivational Interviewing   Gwenevere Ghazi, MSW, [place], [place] 12/24/2020 1:36 PM

## 2020-12-24 NOTE — Plan of Care (Signed)
Mostly in bed resting. Patient was out in the milieu for medications and meals. Pleasant and cooperative and rated his anxiety at 3/10. Reported feeling improvement as evidenced by decreased obsessive thoughts and rituals. Patient has no major concern so far. Encouragements and support provided. Safety monitored per unit protocol.

## 2020-12-24 NOTE — Progress Notes (Signed)
Patient was more visible in the milieu toward the afternoon. Was  Calm and cooperative. Patient watched TV for a while until dinner time. Went back to his room after dinner, reporting that he ate well. Reported that his obsessive thoughts are decreasing. Patient is pleasant and cooperative on approach and taking medications as prescribe. Staff continue to provide support and encouragements. Patient's safety maintained per unit protocol.

## 2020-12-25 NOTE — [facility] Group Notes (Signed)
LCSW Group Therapy Note  12/25/2020 4:09 PM  Type of Therapy/Topic:  Group Therapy:  Feelings about Diagnosis  Participation Level:  Active   Description of Group:   This group will allow patients to explore their thoughts and feelings about diagnoses they have received. Patients will be guided to explore their level of understanding and acceptance of these diagnoses. Facilitator will encourage patients to process their thoughts and feelings about the reactions of others to their diagnosis and will guide patients in identifying ways to discuss their diagnosis with significant others in their lives. This group will be process-oriented, with patients participating in exploration of their own experiences, giving and receiving support, and processing challenge from other group members.   Therapeutic Goals: 1. Patient will demonstrate understanding of diagnosis as evidenced by identifying two or more symptoms of the disorder 2. Patient will be able to express two feelings regarding the diagnosis 3. Patient will demonstrate their ability to communicate their needs through discussion and/or role play  Summary of Patient Progress: Patient was present for the entirety of the group session. Patient was an active listener and participated in the topic of discussion. Participated in group introduction and icebreaker. Patient remained quite throughout most of the group, appeared nervous to respond publicly. In closing remarks, patient shared that he appreciated the non-judgmental environment of support groups.    Therapeutic Modalities:   Cognitive Behavioral Therapy Brief Therapy Feelings Identification   Gwenevere Ghazi, MSW, [place], [place] 12/25/2020 4:09 PM

## 2020-12-25 NOTE — [facility] Counselor (Signed)
CSW met with patient while completing rounds. Patient was informed of interview with [facility] on 12/26/20 @ 1PM. Patient acknowledged and expressed no concerns or needs at this team.   Signed:  Durenda Hurt, MSW, [place], LCASA 12/25/2020 3:18 PM

## 2020-12-25 NOTE — Progress Notes (Signed)
Patient has been less isolative today. Out in the dayroom with his peers watching TV. Calm and cooperative. Denies SI, HI and AVH

## 2020-12-25 NOTE — Progress Notes (Signed)
[facility] MD Progress Note  12/25/2020 8:11 AM Christian Bond  MRN:  [id number] Subjective:  20 year old male with history of MDD and OCD presenting with suicidal ideations and recent ingestion of Lexparo in suicide attempt.    1/30 Staff and nursing have noted that the patient is less Isolative and more visible on the unit. Asks to speak in his room today. Provides limited information and is guarded but conveys feeling back to his baseline. Denies appreciable depression anxiety or OCD today. Further denies hallucinations. Slept well last night. Energy is intact. Denies   medical problems. He plans to go back to live with his parents after discharge. Says that his focus has improved since he has been here.  1/29 Patient is being seen for the first time today. Tells me he is feeling "good,"  and begins by indicating that his anxiety and depression are minimal. His OCD is a 2 out of 10. Says that the main reason that he came was that other people were judging and fighting with him, and is caused him to be agoraphobic (?). He does not offer additional information regarding this.  No thoughts that are weighing heavily on his mind . No sharing points. He slept well last night. Reports discomfort with compulsions to move. Says that he had that even before he started the Abilify. He seems to be visibly comfortable in his chair. Denies homicidal or suicidal thoughts. There side effects on his medication. Asks when his discharge will be. I informed him Dr. Neale Burly will discuss this with him.   Principal Problem: MDD (major depressive disorder), recurrent episode, severe (HCC) Diagnosis: Principal Problem:   MDD (major depressive disorder), recurrent episode, severe (HCC) Active Problems:   Mixed obsessional thoughts and acts  Total Time spent with patient: 27 minutes  Past Psychiatric History: He had a past psychiatric history of OCD since middle school, MDD for 4 years. Previously treated with Lexapro and  Buspar. Denies any other past medications. One suicide attempt via ingestion of 5 tablets of Lexapro. No previous hospitalizations.    Past Medical History:  Past Medical History:  Diagnosis Date  . ADHD (attention deficit hyperactivity disorder)   . Allergy     Past Surgical History:  Procedure Laterality Date  . TONSILLECTOMY AND ADENOIDECTOMY     Family History:  Family History  Problem Relation Age of Onset  . Colon polyps Mother   . Stroke Maternal Uncle   . Heart disease Maternal Grandfather   . Hyperlipidemia Maternal Grandfather   . Stroke Maternal Grandfather    Family Psychiatric  History: Maternal grandmother,unknown diagnosis and brother-mild depression Social History:  Social History   Substance and Sexual Activity  Alcohol Use No     Social History   Substance and Sexual Activity  Drug Use No    Social History   Socioeconomic History  . Marital status: Single    Spouse name: Not on file  . Number of children: Not on file  . Years of education: Not on file  . Highest education level: Not on file  Occupational History  . Not on file  Tobacco Use  . Smoking status: Never Smoker  . Smokeless tobacco: Never Used  Vaping Use  . Vaping Use: Some days  Substance and Sexual Activity  . Alcohol use: No  . Drug use: No  . Sexual activity: Not on file  Other Topics Concern  . Not on file  Social History Narrative  . Not on file  Social Determinants of Health   Financial Resource Strain: Not on file  Food Insecurity: Not on file  Transportation Needs: Not on file  Physical Activity: Not on file  Stress: Not on file  Social Connections: Not on file   Additional Social History:                         Sleep: Fair  Appetite:  Fair  Current Medications: Current Facility-Administered Medications  Medication Dose Route Frequency Provider Last Rate Last Admin  . acetaminophen (TYLENOL) tablet 650 mg  650 mg Oral Q6H PRN Gillermo Murdoch, NP      . alum & mag hydroxide-simeth (MAALOX/MYLANTA) 200-200-20 MG/5ML suspension 30 mL  30 mL Oral Q4H PRN Gillermo Murdoch, NP      . ARIPiprazole (ABILIFY) tablet 15 mg  15 mg Oral Daily Jesse Sans, MD   15 mg at 12/24/20 0829  . busPIRone (BUSPAR) tablet 10 mg  10 mg Oral BID Gillermo Murdoch, NP   10 mg at 12/24/20 1645  . escitalopram (LEXAPRO) tablet 20 mg  20 mg Oral Daily Jesse Sans, MD   20 mg at 12/24/20 0830  . hydrOXYzine (ATARAX/VISTARIL) tablet 25 mg  25 mg Oral Q6H PRN Gillermo Murdoch, NP   25 mg at 12/22/20 1711  . magnesium hydroxide (MILK OF MAGNESIA) suspension 30 mL  30 mL Oral Daily PRN Gillermo Murdoch, NP      . traZODone (DESYREL) tablet 100 mg  100 mg Oral Daily PRN Clapacs, Jackquline Denmark, MD   100 mg at 12/22/20 2059    Lab Results:  No results found for this or any previous visit (from the past 48 hour(s)).  Blood Alcohol level:  Lab Results  Component Value Date   ETH <10 12/20/2020    Metabolic Disorder Labs: Lab Results  Component Value Date   HGBA1C 5.0 12/22/2020   MPG 96.8 12/22/2020   No results found for: PROLACTIN Lab Results  Component Value Date   CHOL 218 (H) 12/22/2020   TRIG 60 12/22/2020   HDL 46 12/22/2020   CHOLHDL 4.7 12/22/2020   VLDL 12 12/22/2020   LDLCALC 160 (H) 12/22/2020    Physical Findings: AIMS:  , ,  ,  ,    CIWA:    COWS:     Musculoskeletal: Strength & Muscle Tone: within normal limits Gait & Station: normal Patient leans: N/A  Psychiatric Specialty Exam:   Review of Systems  Constitutional: Negative for chills and fatigue.  HENT: Negative for rhinorrhea and sore throat.   Eyes: Negative for photophobia and visual disturbance.  Respiratory: Negative for cough and shortness of breath.   Cardiovascular: Negative for chest pain and palpitations.  Gastrointestinal: Negative for constipation, diarrhea, nausea and vomiting.  Endocrine: Negative for cold intolerance and heat  intolerance.  Genitourinary: Negative for difficulty urinating and dysuria.  Musculoskeletal: Negative for arthralgias and myalgias.  Skin: Negative for rash and wound.  Allergic/Immunologic: Negative for food allergies and immunocompromised state.  Neurological: Negative for dizziness and headaches.  Hematological: Negative for adenopathy. Does not bruise/bleed easily.  Psychiatric/Behavioral: Negative for dysphoric mood and suicidal ideas. The patient is nervous/anxious.     Blood pressure 119/78, pulse (!) 59, temperature 97.8 F (36.6 C), temperature source Oral, resp. rate 18, height 5\' 10"  (1.778 m), weight 58.1 kg, SpO2 98 %.Body mass index is 18.37 kg/m.  General Appearance: gaurded  Eye Contact:  Good  Speech:  wnl  Volume:  Normal  Mood:  good  Affect:  neutral  Thought Process:  Disorganized  Orientation:  Full (Time, Place, and Person)  Thought Content:  Obsessions and Paranoid Ideation  Suicidal Thoughts:  Yes.  without intent/plan  Homicidal Thoughts:  No  Memory:  Immediate;   Good  Judgement:  good  Insight:  good  Psychomotor Activity:  Restlessness  Concentration:  Concentration: Fair and Attention Span: Poor  Recall:  [employer] of Knowledge:  Fair  Language:  Fair  Akathisia:  Negative  Handed:  Right  AIMS (if indicated):     Assets:  Communication Skills Desire for Improvement Financial Resources/Insurance Housing Physical Health Social Support  ADL's:  Intact  Cognition:  wnl  Sleep:  Number of Hours: 6.75     Treatment Plan Summary: Daily contact with patient to assess and evaluate symptoms and progress in treatment and Medication management PLAN OF CARE:20 year old male with history of MDD and OCD presenting with suicidal ideations and recent ingestion of Lexparo in suicide attempt. Reporting numerous intrusive thoughts causing decreased concentration, distress, and anxiety. Presentation consistent with exacerbation of OCD and MDD, recurrent,  severe with psychotic features. Rule out bipolar II disorder. Discontinued Wellbutrin 150 mg daily. Continued Lexapro 20 mg daily, and increase Abilify 15 mg daily for psychosis and mood.  12/24/2020 No changes  12/25/2020 No EPS, NMS Reggie Pile, MD 12/25/2020, 8:11 AM

## 2020-12-25 NOTE — Progress Notes (Addendum)
Pt is calm and cooperative on assessment. He denies SI, HI, and AVH. He is isolative to his room this morning but comes out for breakfast. He also comes out for lunch and is observed to be interacting with others. Pt is in good spirits and states "I dropped out of school but I want to go back." Pt reports that depression and anxiety is minimal and states that he believes attending groups and speaking with others on the unit has helped him. He reports sleep and appetite is good. He denies pain or any physical problems.  Pt is given medications per MD orders. Support and encouragement is provided. Pt remains safe on the unit at this time. Q15 min safety checks are maintained.

## 2020-12-25 NOTE — Plan of Care (Signed)
  Problem: Education: Goal: Knowledge of [facility] Education information/materials will improve Outcome: Progressing Goal: Emotional status will improve Outcome: Progressing Goal: Mental status will improve Outcome: Progressing Goal: Verbalization of understanding the information provided will improve Outcome: Progressing   Problem: Activity: Goal: Interest or engagement in activities will improve Outcome: Progressing Goal: Sleeping patterns will improve Outcome: Progressing   Problem: Coping: Goal: Ability to verbalize frustrations and anger appropriately will improve Outcome: Progressing Goal: Ability to demonstrate self-control will improve Outcome: Progressing   Problem: Health Behavior/Discharge Planning: Goal: Identification of resources available to assist in meeting health care needs will improve Outcome: Progressing Goal: Compliance with treatment plan for underlying cause of condition will improve Outcome: Progressing   Problem: Physical Regulation: Goal: Ability to maintain clinical measurements within normal limits will improve Outcome: Progressing   Problem: Safety: Goal: Periods of time without injury will increase Outcome: Progressing   Problem: Education: Goal: Utilization of techniques to improve thought processes will improve Outcome: Progressing Goal: Knowledge of the prescribed therapeutic regimen will improve Outcome: Progressing   Problem: Activity: Goal: Interest or engagement in leisure activities will improve Outcome: Progressing Goal: Imbalance in normal sleep/wake cycle will improve Outcome: Progressing   Problem: Coping: Goal: Coping ability will improve Outcome: Progressing Goal: Will verbalize feelings Outcome: Progressing   Problem: Health Behavior/Discharge Planning: Goal: Ability to make decisions will improve Outcome: Progressing Goal: Compliance with therapeutic regimen will improve Outcome: Progressing    Problem: Role Relationship: Goal: Will demonstrate positive changes in social behaviors and relationships Outcome: Progressing   Problem: Safety: Goal: Ability to disclose and discuss suicidal ideas will improve Outcome: Progressing Goal: Ability to identify and utilize support systems that promote safety will improve Outcome: Progressing   Problem: Self-Concept: Goal: Will verbalize positive feelings about self Outcome: Progressing Goal: Level of anxiety will decrease Outcome: Progressing   Problem: Education: Goal: Ability to make informed decisions regarding treatment will improve Outcome: Progressing   Problem: Coping: Goal: Coping ability will improve Outcome: Progressing   Problem: Health Behavior/Discharge Planning: Goal: Identification of resources available to assist in meeting health care needs will improve Outcome: Progressing   Problem: Medication: Goal: Compliance with prescribed medication regimen will improve Outcome: Progressing   Problem: Self-Concept: Goal: Ability to disclose and discuss suicidal ideas will improve Outcome: Progressing Goal: Will verbalize positive feelings about self Outcome: Progressing   

## 2020-12-26 ENCOUNTER — Other Ambulatory Visit: Payer: Self-pay | Admitting: Behavioral Health

## 2020-12-26 DIAGNOSIS — F333 Major depressive disorder, recurrent, severe with psychotic symptoms: Secondary | ICD-10-CM | POA: Diagnosis not present

## 2020-12-26 MED ORDER — HYDROXYZINE HCL 25 MG PO TABS: 25.0000 mg | ORAL_TABLET | Freq: Three times a day (TID) | ORAL | 1 refills | Status: DC | PRN

## 2020-12-26 MED ORDER — ARIPIPRAZOLE 15 MG PO TABS: 15.0000 mg | ORAL_TABLET | Freq: Every day | ORAL | 1 refills | Status: DC

## 2020-12-26 MED ORDER — ESCITALOPRAM OXALATE 20 MG PO TABS: 20.0000 mg | ORAL_TABLET | Freq: Every day | ORAL | 1 refills | Status: DC

## 2020-12-26 MED ORDER — BUSPIRONE HCL 10 MG PO TABS: 10.0000 mg | ORAL_TABLET | Freq: Two times a day (BID) | ORAL | 1 refills | Status: DC

## 2020-12-26 NOTE — Plan of Care (Signed)
Patient stated he is feeling better than when he came in and is ready to D/C this week   Problem: Education: Goal: Emotional status will improve Outcome: Progressing Goal: Mental status will improve Outcome: Progressing

## 2020-12-26 NOTE — [facility] Group Notes (Signed)
LCSW Group Therapy Note   12/26/2020 1:59 PM  Type of Therapy and Topic:  Group Therapy:  Overcoming Obstacles   Participation Level:  Did Not Attend   Description of Group:    In this group patients will be encouraged to explore what they see as obstacles to their own wellness and recovery. They will be guided to discuss their thoughts, feelings, and behaviors related to these obstacles. The group will process together ways to cope with barriers, with attention given to specific choices patients can make. Each patient will be challenged to identify changes they are motivated to make in order to overcome their obstacles. This group will be process-oriented, with patients participating in exploration of their own experiences as well as giving and receiving support and challenge from other group members.   Therapeutic Goals: 1. Patient will identify personal and current obstacles as they relate to admission. 2. Patient will identify barriers that currently interfere with their wellness or overcoming obstacles.  3. Patient will identify feelings, thought process and behaviors related to these barriers. 4. Patient will identify two changes they are willing to make to overcome these obstacles:      Summary of Patient Progress Patient did not attend group due to a scheduled phone call.    Therapeutic Modalities:   Cognitive Behavioral Therapy Solution Focused Therapy Motivational Interviewing Relapse Prevention Therapy  Penni Homans, MSW, LCSW 12/26/2020 1:59 PM

## 2020-12-26 NOTE — Progress Notes (Signed)
Patient has been less isolative. Quiet, calm and cooperative. Denies SI, HI and AVH

## 2020-12-26 NOTE — Plan of Care (Signed)
Patient continues to report decreased anxiety and obsessive thoughts. Patient had a long conversation with this Clinical research associate, and mentioned that "bad friends" have a lot to to with his emotional instabilities. Patient  is willing to change his support system and move on with school. He is interested in receiving outpatient therapy. Reported that his parents are supportive "I have to do my part". No major concerns expressed.

## 2020-12-26 NOTE — Progress Notes (Signed)
[facility] MD Progress Note  12/26/2020 12:02 PM Christian Bond  MRN:  [id number] Subjective:  20 year old male with history of MDD and OCD presenting with suicidal ideations and recent ingestion of Lexparo in suicide attempt. No acute events overnight, medication compliant, performing ADLs.  Patient seen one-on-one at bedside today. He was reporting that he feels "pretty good" today. He notes he had a fairly good weekend. He has been attending groups, and interacting with peers. Today, he does not exhibit any thought blocking or delayed speech. He also appears much less anxious on physical exam. He also reports feeling less anxious. He feels the medications are working well, and denies any side effects to them. He denies suicidal ideations, homicidal ideations, visual hallucinations, and auditory hallucinations. He has an interview with [facility] today at 1PM. He also requests that I touch base with his parents.   Contacted his mother, Behr Cislo, [phone]: Updated on currents diagnoses, medications, and plan for interview with [facility] today. She also feels that [place] is sounding better on the phone, and less anxious overall. She is okay with Chrissie Noa returning home tomorrow to stay with them until he can go to [facility] in [place].   Principal Problem: MDD (major depressive disorder), recurrent episode, severe (HCC) Diagnosis: Principal Problem:   MDD (major depressive disorder), recurrent episode, severe (HCC) Active Problems:   Mixed obsessional thoughts and acts  Total Time spent with patient: 30 minutes  Past Psychiatric History: He had a past psychiatric history of OCD since middle school, MDD for 4 years. Previously treated with Lexapro and Buspar. Denies any other past medications. One suicide attempt via ingestion of 5 tablets of Lexapro. No previous hospitalizations.    Past Medical History:  Past Medical History:  Diagnosis Date  . ADHD (attention deficit hyperactivity  disorder)   . Allergy     Past Surgical History:  Procedure Laterality Date  . TONSILLECTOMY AND ADENOIDECTOMY     Family History:  Family History  Problem Relation Age of Onset  . Colon polyps Mother   . Stroke Maternal Uncle   . Heart disease Maternal Grandfather   . Hyperlipidemia Maternal Grandfather   . Stroke Maternal Grandfather    Family Psychiatric  History: Maternal grandmother,unknown diagnosis and brother-mild depression Social History:  Social History   Substance and Sexual Activity  Alcohol Use No     Social History   Substance and Sexual Activity  Drug Use No    Social History   Socioeconomic History  . Marital status: Single    Spouse name: Not on file  . Number of children: Not on file  . Years of education: Not on file  . Highest education level: Not on file  Occupational History  . Not on file  Tobacco Use  . Smoking status: Never Smoker  . Smokeless tobacco: Never Used  Vaping Use  . Vaping Use: Some days  Substance and Sexual Activity  . Alcohol use: No  . Drug use: No  . Sexual activity: Not on file  Other Topics Concern  . Not on file  Social History Narrative  . Not on file   Social Determinants of Health   Financial Resource Strain: Not on file  Food Insecurity: Not on file  Transportation Needs: Not on file  Physical Activity: Not on file  Stress: Not on file  Social Connections: Not on file   Additional Social History:  Sleep: Fair  Appetite:  Fair  Current Medications: Current Facility-Administered Medications  Medication Dose Route Frequency Provider Last Rate Last Admin  . acetaminophen (TYLENOL) tablet 650 mg  650 mg Oral Q6H PRN Gillermo Murdoch, NP      . alum & mag hydroxide-simeth (MAALOX/MYLANTA) 200-200-20 MG/5ML suspension 30 mL  30 mL Oral Q4H PRN Gillermo Murdoch, NP      . ARIPiprazole (ABILIFY) tablet 15 mg  15 mg Oral Daily Jesse Sans, MD   15 mg at  12/26/20 0850  . busPIRone (BUSPAR) tablet 10 mg  10 mg Oral BID Gillermo Murdoch, NP   10 mg at 12/26/20 0850  . escitalopram (LEXAPRO) tablet 20 mg  20 mg Oral Daily Jesse Sans, MD   20 mg at 12/26/20 0850  . hydrOXYzine (ATARAX/VISTARIL) tablet 25 mg  25 mg Oral Q6H PRN Gillermo Murdoch, NP   25 mg at 12/22/20 1711  . magnesium hydroxide (MILK OF MAGNESIA) suspension 30 mL  30 mL Oral Daily PRN Gillermo Murdoch, NP      . traZODone (DESYREL) tablet 100 mg  100 mg Oral Daily PRN Clapacs, Jackquline Denmark, MD   100 mg at 12/22/20 2059    Lab Results:  No results found for this or any previous visit (from the past 48 hour(s)).  Blood Alcohol level:  Lab Results  Component Value Date   ETH <10 12/20/2020    Metabolic Disorder Labs: Lab Results  Component Value Date   HGBA1C 5.0 12/22/2020   MPG 96.8 12/22/2020   No results found for: PROLACTIN Lab Results  Component Value Date   CHOL 218 (H) 12/22/2020   TRIG 60 12/22/2020   HDL 46 12/22/2020   CHOLHDL 4.7 12/22/2020   VLDL 12 12/22/2020   LDLCALC 160 (H) 12/22/2020    Physical Findings: AIMS:  , ,  ,  ,    CIWA:    COWS:     Musculoskeletal: Strength & Muscle Tone: within normal limits Gait & Station: normal Patient leans: N/A  Psychiatric Specialty Exam: Physical Exam Vitals and nursing note reviewed.  Constitutional:      Appearance: Normal appearance.  HENT:     Head: Normocephalic and atraumatic.     Right Ear: External ear normal.     Left Ear: External ear normal.     Nose: Nose normal.     Mouth/Throat:     Mouth: Mucous membranes are moist.     Pharynx: Oropharynx is clear.  Eyes:     Extraocular Movements: Extraocular movements intact.     Conjunctiva/sclera: Conjunctivae normal.     Pupils: Pupils are equal, round, and reactive to light.  Cardiovascular:     Rate and Rhythm: Normal rate.     Pulses: Normal pulses.  Pulmonary:     Effort: Pulmonary effort is normal.     Breath sounds:  Normal breath sounds.  Abdominal:     General: Abdomen is flat.     Palpations: Abdomen is soft.  Musculoskeletal:        General: No swelling. Normal range of motion.     Cervical back: Normal range of motion and neck supple.  Skin:    General: Skin is warm and dry.  Neurological:     General: No focal deficit present.     Mental Status: He is alert.     Cranial Nerves: No cranial nerve deficit.  Psychiatric:        Attention and Perception: He is attentive.  He does not perceive auditory hallucinations.        Mood and Affect: Mood is anxious.        Speech: Speech normal. Speech is not delayed.        Behavior: Behavior is not withdrawn. Behavior is cooperative.        Thought Content: Thought content is not paranoid or delusional. Thought content does not include homicidal or suicidal ideation.        Cognition and Memory: Cognition and memory normal.        Judgment: Judgment normal.     Review of Systems  Constitutional: Negative for chills and fatigue.  HENT: Negative for rhinorrhea and sore throat.   Eyes: Negative for photophobia and visual disturbance.  Respiratory: Negative for cough and shortness of breath.   Cardiovascular: Negative for chest pain and palpitations.  Gastrointestinal: Negative for constipation, diarrhea, nausea and vomiting.  Endocrine: Negative for cold intolerance and heat intolerance.  Genitourinary: Negative for difficulty urinating and dysuria.  Musculoskeletal: Negative for arthralgias and myalgias.  Skin: Negative for rash and wound.  Allergic/Immunologic: Negative for food allergies and immunocompromised state.  Neurological: Negative for dizziness and headaches.  Hematological: Negative for adenopathy. Does not bruise/bleed easily.  Psychiatric/Behavioral: Negative for dysphoric mood, hallucinations and suicidal ideas. The patient is nervous/anxious.     Blood pressure 130/89, pulse 75, temperature 97.6 F (36.4 C), temperature source Oral,  resp. rate 17, height 5\' 10"  (1.778 m), weight 58.1 kg, SpO2 100 %.Body mass index is 18.37 kg/m.  General Appearance: Fairly Groomed  Eye Contact:  Good  Speech:  Clear and Coherent and Normal Rate  Volume:  Normal  Mood:  Anxious  Affect:  Congruent  Thought Process:  Coherent and Linear  Orientation:  Full (Time, Place, and Person)  Thought Content:  Obsessions present, but less severe than on admission  Suicidal Thoughts:  No  Homicidal Thoughts:  No  Memory:  Immediate;   Good  Judgement:  Intact  Insight:  Fair  Psychomotor Activity:  Restlessness  Concentration:  Concentration: Fair and Attention Span: Fair  Recall:  of Knowledge:  Fair  Language:  Fair  Akathisia:  Negative  Handed:  Right  AIMS (if indicated):     Assets:  Communication Skills Desire for Improvement Financial Resources/Insurance Housing Physical Health Social Support  ADL's:  Intact  Cognition:  Impaired,  Mild  Sleep:  Number of Hours: 6.75     Treatment Plan Summary: Daily contact with patient to assess and evaluate symptoms and progress in treatment and Medication management PLAN OF CARE:20 year old male with history of MDD and OCD presenting with suicidal ideations and recent ingestion of Lexparo in suicide attempt. Reporting numerous intrusive thoughts causing decreased concentration, distress, and anxiety. Presentation consistent with exacerbation of OCD and MDD, recurrent, severe with psychotic features. Rule out bipolar II disorder. Discontinued Wellbutrin 150 mg daily. Continued Lexapro 20 mg daily, and Abilify 15 mg daily for psychosis and mood.  12, MD 12/26/2020, 12:02 PM

## 2020-12-26 NOTE — Progress Notes (Signed)
Patient minimal with staff this evening during assessment denying SI/HI/AVH. Patient endorses anxiety. Patient isolative to his room this evening. Patient didn't have any medications scheduled and didn't request anything PRN. Patient given education, support, and encouragement to be active in his treatment plan. Patient being monitored Q 15 minutes for safety per unit protocol. Pt remains safe on the unit.  

## 2020-12-26 NOTE — Progress Notes (Signed)
Recreation Therapy Notes  Date: 12/26/2020  Time: 9:30 am   Location: Craft room   Behavioral response: Appropriate  Intervention Topic: Problem Solving   Discussion/Intervention:  Group content on today was focused on problem solving. The group described what problem solving is. Patients expressed how problems affect them and how they deal with problems. Individuals identified healthy ways to deal with problems. Patients explained what normally happens to them when they do not deal with problems. The group expressed reoccurring problems for them. The group participated in the intervention "Ways to Solve problems" where patients were given a chance to explore different ways to solve problems.  Clinical Observations/Feedback: Patient came to group and expressed that he problem solves by thinking about things. He stated that he sometimes overthinks things and it stops him from problem-solving. Participant explained that problem-solving is important because it helps you get better. Patient stated that he sometimes does not ask for help because of his pride. He identified joining a support group as a way to improve his problem-solving skills. Individual was social with staff while participating in the intervention. Shep Porter LRT/CTRS         Kristan Brummitt 12/26/2020 11:06 AM

## 2020-12-26 NOTE — Plan of Care (Signed)
  Problem: Education: Goal: Knowledge of [facility] Education information/materials will improve Outcome: Progressing Goal: Emotional status will improve Outcome: Progressing Goal: Mental status will improve Outcome: Progressing Goal: Verbalization of understanding the information provided will improve Outcome: Progressing   Problem: Activity: Goal: Interest or engagement in activities will improve Outcome: Progressing Goal: Sleeping patterns will improve Outcome: Progressing   Problem: Coping: Goal: Ability to verbalize frustrations and anger appropriately will improve Outcome: Progressing Goal: Ability to demonstrate self-control will improve Outcome: Progressing   Problem: Health Behavior/Discharge Planning: Goal: Identification of resources available to assist in meeting health care needs will improve Outcome: Progressing Goal: Compliance with treatment plan for underlying cause of condition will improve Outcome: Progressing   Problem: Physical Regulation: Goal: Ability to maintain clinical measurements within normal limits will improve Outcome: Progressing   Problem: Safety: Goal: Periods of time without injury will increase Outcome: Progressing   Problem: Education: Goal: Utilization of techniques to improve thought processes will improve Outcome: Progressing Goal: Knowledge of the prescribed therapeutic regimen will improve Outcome: Progressing   Problem: Activity: Goal: Interest or engagement in leisure activities will improve Outcome: Progressing Goal: Imbalance in normal sleep/wake cycle will improve Outcome: Progressing   Problem: Coping: Goal: Coping ability will improve Outcome: Progressing Goal: Will verbalize feelings Outcome: Progressing   Problem: Health Behavior/Discharge Planning: Goal: Ability to make decisions will improve Outcome: Progressing Goal: Compliance with therapeutic regimen will improve Outcome: Progressing    Problem: Role Relationship: Goal: Will demonstrate positive changes in social behaviors and relationships Outcome: Progressing   Problem: Safety: Goal: Ability to disclose and discuss suicidal ideas will improve Outcome: Progressing Goal: Ability to identify and utilize support systems that promote safety will improve Outcome: Progressing   Problem: Self-Concept: Goal: Will verbalize positive feelings about self Outcome: Progressing Goal: Level of anxiety will decrease Outcome: Progressing   Problem: Education: Goal: Ability to make informed decisions regarding treatment will improve Outcome: Progressing   Problem: Coping: Goal: Coping ability will improve Outcome: Progressing   Problem: Health Behavior/Discharge Planning: Goal: Identification of resources available to assist in meeting health care needs will improve Outcome: Progressing   Problem: Medication: Goal: Compliance with prescribed medication regimen will improve Outcome: Progressing   Problem: Self-Concept: Goal: Ability to disclose and discuss suicidal ideas will improve Outcome: Progressing Goal: Will verbalize positive feelings about self Outcome: Progressing   

## 2020-12-26 NOTE — Tx Team (Signed)
Interdisciplinary Treatment and Diagnostic Plan Update  12/26/2020 Time of Session: 8:30AM Christian Bond MRN: [id number]  Principal Diagnosis: MDD (major depressive disorder), recurrent episode, severe (HCC)  Secondary Diagnoses: Principal Problem:   MDD (major depressive disorder), recurrent episode, severe (HCC) Active Problems:   Mixed obsessional thoughts and acts   Current Medications:  Current Facility-Administered Medications  Medication Dose Route Frequency Provider Last Rate Last Admin  . acetaminophen (TYLENOL) tablet 650 mg  650 mg Oral Q6H PRN Gillermo Murdoch, NP      . alum & mag hydroxide-simeth (MAALOX/MYLANTA) 200-200-20 MG/5ML suspension 30 mL  30 mL Oral Q4H PRN Gillermo Murdoch, NP      . ARIPiprazole (ABILIFY) tablet 15 mg  15 mg Oral Daily Jesse Sans, MD   15 mg at 12/26/20 0850  . busPIRone (BUSPAR) tablet 10 mg  10 mg Oral BID Gillermo Murdoch, NP   10 mg at 12/26/20 0850  . escitalopram (LEXAPRO) tablet 20 mg  20 mg Oral Daily Jesse Sans, MD   20 mg at 12/26/20 0850  . hydrOXYzine (ATARAX/VISTARIL) tablet 25 mg  25 mg Oral Q6H PRN Gillermo Murdoch, NP   25 mg at 12/22/20 1711  . magnesium hydroxide (MILK OF MAGNESIA) suspension 30 mL  30 mL Oral Daily PRN Gillermo Murdoch, NP      . traZODone (DESYREL) tablet 100 mg  100 mg Oral Daily PRN Clapacs, Jackquline Denmark, MD   100 mg at 12/22/20 2059   PTA Medications: Medications Prior to Admission  Medication Sig Dispense Refill Last Dose  . buPROPion (WELLBUTRIN XL) 150 MG 24 hr tablet Take 1 tablet (150 mg total) by mouth daily. 30 tablet 0   . busPIRone (BUSPAR) 10 MG tablet Take 1 tablet (10 mg total) by mouth 2 (two) times daily. 60 tablet 0   . escitalopram (LEXAPRO) 20 MG tablet Take 1 tablet (20 mg total) by mouth daily. 30 tablet 0   . fexofenadine (ALLEGRA) 180 MG tablet Take 180 mg by mouth daily.     . hydrOXYzine (ATARAX/VISTARIL) 10 MG tablet Take 1 tablet (10 mg total) by  mouth 3 (three) times daily as needed for anxiety. 90 tablet 0   . hydrOXYzine (ATARAX/VISTARIL) 25 MG tablet Take 0.5-1 tablets(12.5-25 mg total) by mouthe 3(three) times daily as needed for anxiety, and 1 tablet(25 mg total) at bedtime as needed for sleeping difficulties. 30 tablet 0     Patient Stressors: Medication change or noncompliance Substance abuse  Patient Strengths: Capable of independent living  Treatment Modalities: Medication Management, Group therapy, Case management,  1 to 1 session with clinician, Psychoeducation, Recreational therapy.   Physician Treatment Plan for Primary Diagnosis: MDD (major depressive disorder), recurrent episode, severe (HCC) Long Term Goal(s): Improvement in symptoms so as ready for discharge Improvement in symptoms so as ready for discharge   Short Term Goals: Ability to identify changes in lifestyle to reduce recurrence of condition will improve Ability to verbalize feelings will improve Ability to disclose and discuss suicidal ideas Ability to demonstrate self-control will improve Ability to identify and develop effective coping behaviors will improve Ability to maintain clinical measurements within normal limits will improve Compliance with prescribed medications will improve Ability to identify triggers associated with substance abuse/mental health issues will improve Ability to identify changes in lifestyle to reduce recurrence of condition will improve Ability to verbalize feelings will improve Ability to disclose and discuss suicidal ideas Ability to demonstrate self-control will improve Ability to identify and develop effective  coping behaviors will improve Ability to maintain clinical measurements within normal limits will improve Ability to identify triggers associated with substance abuse/mental health issues will improve  Medication Management: Evaluate patient's response, side effects, and tolerance of medication  regimen.  Therapeutic Interventions: 1 to 1 sessions, Unit Group sessions and Medication administration.  Evaluation of Outcomes: Progressing  Physician Treatment Plan for Secondary Diagnosis: Principal Problem:   MDD (major depressive disorder), recurrent episode, severe (HCC) Active Problems:   Mixed obsessional thoughts and acts  Long Term Goal(s): Improvement in symptoms so as ready for discharge Improvement in symptoms so as ready for discharge   Short Term Goals: Ability to identify changes in lifestyle to reduce recurrence of condition will improve Ability to verbalize feelings will improve Ability to disclose and discuss suicidal ideas Ability to demonstrate self-control will improve Ability to identify and develop effective coping behaviors will improve Ability to maintain clinical measurements within normal limits will improve Compliance with prescribed medications will improve Ability to identify triggers associated with substance abuse/mental health issues will improve Ability to identify changes in lifestyle to reduce recurrence of condition will improve Ability to verbalize feelings will improve Ability to disclose and discuss suicidal ideas Ability to demonstrate self-control will improve Ability to identify and develop effective coping behaviors will improve Ability to maintain clinical measurements within normal limits will improve Ability to identify triggers associated with substance abuse/mental health issues will improve     Medication Management: Evaluate patient's response, side effects, and tolerance of medication regimen.  Therapeutic Interventions: 1 to 1 sessions, Unit Group sessions and Medication administration.  Evaluation of Outcomes: Progressing   RN Treatment Plan for Primary Diagnosis: MDD (major depressive disorder), recurrent episode, severe (HCC) Long Term Goal(s): Knowledge of disease and therapeutic regimen to maintain health will  improve  Short Term Goals: Ability to demonstrate self-control, Ability to participate in decision making will improve, Ability to verbalize feelings will improve, Ability to disclose and discuss suicidal ideas, Ability to identify and develop effective coping behaviors will improve and Compliance with prescribed medications will improve  Medication Management: RN will administer medications as ordered by provider, will assess and evaluate patient's response and provide education to patient for prescribed medication. RN will report any adverse and/or side effects to prescribing provider.  Therapeutic Interventions: 1 on 1 counseling sessions, Psychoeducation, Medication administration, Evaluate responses to treatment, Monitor vital signs and CBGs as ordered, Perform/monitor CIWA, COWS, AIMS and Fall Risk screenings as ordered, Perform wound care treatments as ordered.  Evaluation of Outcomes: Progressing   LCSW Treatment Plan for Primary Diagnosis: MDD (major depressive disorder), recurrent episode, severe (HCC) Long Term Goal(s): Safe transition to appropriate next level of care at discharge, Engage patient in therapeutic group addressing interpersonal concerns.  Short Term Goals: Engage patient in aftercare planning with referrals and resources, Increase social support, Increase ability to appropriately verbalize feelings, Increase emotional regulation, Facilitate acceptance of mental health diagnosis and concerns and Increase skills for wellness and recovery  Therapeutic Interventions: Assess for all discharge needs, 1 to 1 time with Social worker, Explore available resources and support systems, Assess for adequacy in community support network, Educate family and significant other(s) on suicide prevention, Complete Psychosocial Assessment, Interpersonal group therapy.  Evaluation of Outcomes: Progressing   Progress in Treatment: Attending groups: Yes. Participating in groups: Yes. Taking  medication as prescribed: Yes. Toleration medication: Yes. Family/Significant other contact made: Yes, individual(s) contacted:  [place]ithe Radiographer, therapeutic, mother. Patient understands diagnosis: Yes. Discussing patient identified problems/goals with  staff: Yes. Medical problems stabilized or resolved: Yes. Denies suicidal/homicidal ideation: Yes. Issues/concerns per patient self-inventory: No. Other: none  New problem(s) identified: No, Describe:  none  New Short Term/Long Term Goal(s): elimination of symptoms of psychosis, medication management for mood stabilization; elimination of SI thoughts; development of comprehensive mental wellness/sobriety plan. Update 12/26/20: No changes at this time.  Patient Goals:  "I don't know.  I was to go to [facility] in [place]" Update 12/26/20: No changes at this time.  Discharge Plan or Barriers:  CSW will assist patient in developing appropriate discharge plans. Update 12/26/20: Pt has an interview/screening with [facility] for further treatment.     Reason for Continuation of Hospitalization: Anxiety Depression Medical Issues Medication stabilization Suicidal ideation  Estimated Length of Stay:  1-7 days  Recreational Therapy: Patient Stressors: N/A Patient Goal: Patient will engage in groups without prompting or encouragement from LRT x3 group sessions within 5 recreation therapy group sessions.  Attendees: Patient:  12/26/2020 9:07 AM  Physician: Dr. Neale Burly, MD 12/26/2020 9:07 AM  Nursing:  12/26/2020 9:07 AM  RN Care Manager: 12/26/2020 9:07 AM  Social Worker: Penni Homans, MSW, LCSW 12/26/2020 9:07 AM  Recreational Therapist:  12/26/2020 9:07 AM  Other: Juanita Laster" [place], LCSW 12/26/2020 9:07 AM  Other:  12/26/2020 9:07 AM  Other: 12/26/2020 9:07 AM    Scribe for Treatment Team: [name], LCSW 12/26/2020 9:07 AM

## 2020-12-27 DIAGNOSIS — F333 Major depressive disorder, recurrent, severe with psychotic symptoms: Secondary | ICD-10-CM | POA: Diagnosis not present

## 2020-12-27 NOTE — BHH Suicide Risk Assessment (Signed)
[facility] Discharge Suicide Risk Assessment   Principal Problem: MDD (major depressive disorder), recurrent episode, severe (HCC) Discharge Diagnoses: Principal Problem:   MDD (major depressive disorder), recurrent episode, severe (HCC) Active Problems:   Obsessive compulsive disorder   Total Time spent with patient: 30 minutes  Musculoskeletal: Strength & Muscle Tone: within normal limits Gait & Station: normal Patient leans: N/A  Psychiatric Specialty Exam: Review of Systems  Constitutional: Negative for appetite change and fatigue.  HENT: Negative for rhinorrhea and sore throat.   Eyes: Negative for photophobia and visual disturbance.  Respiratory: Negative for cough and shortness of breath.   Cardiovascular: Negative for chest pain and palpitations.  Gastrointestinal: Negative for constipation, diarrhea, nausea and vomiting.  Endocrine: Negative for cold intolerance and heat intolerance.  Genitourinary: Negative for difficulty urinating and dysuria.  Musculoskeletal: Negative for arthralgias and myalgias.  Skin: Negative for rash and wound.  Allergic/Immunologic: Negative for food allergies and immunocompromised state.  Neurological: Negative for dizziness and headaches.  Hematological: Negative for adenopathy. Does not bruise/bleed easily.  Psychiatric/Behavioral: Negative for behavioral problems, hallucinations, sleep disturbance and suicidal ideas.    Blood pressure 129/83, pulse 82, temperature 97.8 F (36.6 C), temperature source Oral, resp. rate 18, height 5\' 10"  (1.778 m), weight 58.1 kg, SpO2 98 %.Body mass index is 18.37 kg/m.  General Appearance: Well Groomed  ::  Good  Speech:  Clear and Coherent and Normal Rate  Volume:  Normal  Mood:  Euthymic  Affect:  Congruent  Thought Process:  Coherent and Linear  Orientation:  Full (Time, Place, and Person)  Thought Content:  Logical  Suicidal Thoughts:  No  Homicidal Thoughts:  No  Memory:  Immediate;    Fair Recent;   Fair Remote;   Fair  Judgement:  Fair  Insight:  Good  Psychomotor Activity:  Normal  Concentration:  Fair  Recall:  [id number] of Knowledge:Fair  Language: Fair  Akathisia:  Negative  Handed:  Right  AIMS (if indicated):     Assets:  Communication Skills Desire for Improvement Financial Resources/Insurance Housing Leisure Time Physical Health Resilience Social Support Transportation  Sleep:  Number of Hours: 8.45  Cognition: WNL  ADL's:  Intact   Mental Status Per Nursing Assessment::   On Admission:  Suicidal ideation indicated by patient  Demographic Factors:  Male, Caucasian and Unemployed  Loss Factors: NA  Historical Factors: Impulsivity  Risk Reduction Factors:   Sense of responsibility to family, Living with another person, especially a relative, Positive social support, Positive therapeutic relationship and Positive coping skills or problem solving skills  Continued Clinical Symptoms:  Severe Anxiety and/or Agitation Depression:   Recent sense of peace/wellbeing Obsessive-Compulsive Disorder Previous Psychiatric Diagnoses and Treatments  Cognitive Features That Contribute To Risk:  None    Suicide Risk:  Minimal: No identifiable suicidal ideation.  Patients presenting with no risk factors but with morbid ruminations; may be classified as minimal risk based on the severity of the depressive symptoms   Follow-up Information    [facility] Follow up.   Specialty: Behavioral Health Why: Appointment is scheduled for 01/03/2021 at 2:00PM via video in My Chart.  Thanks! Contact information: [address] Medical [facility] [place] [phone]       [facility] Follow up.   Why: [facility] is currently reviewing financial paperwork with your parents.  Current waitlist is about 3 weeks long.  They will follow up when bed is available.   Contact  information: [address] [place], [place] [address] [phone] Fax: [phone]              Plan Of Care/Follow-up recommendations:  Activity:  as tolerated Diet:  regular diet  Christian Sans, MD 12/27/2020, 10:11 AM

## 2020-12-27 NOTE — Progress Notes (Signed)
D: Pt alert and oriented. Pt denies experiencing any pain, SI/HI, or AVH at this time. Pt reports he will be able to keep himself safe when they return home.   A: Pt received discharge and medication education/information. Pt belongings were returned and confirmed to be all present.   R: Pt verbalized understanding of discharge and medication education/information.  Pt escorted by staff to medical mall front lobby where pt was picked up by mother.

## 2020-12-27 NOTE — BHH Group Notes (Signed)
BHH Group Notes:  (Nursing/MHT/Case Management/Adjunct)  Date:  12/27/2020  Time:  12:25 AM  Type of Therapy:  Group Therapy  Participation Level:  Active  Participation Quality:  Appropriate  Affect:  Appropriate  Cognitive:  Alert  Insight:  Good  Engagement in Group:  Engaged and and had a good day.  Modes of Intervention:  Support  Summary of Progress/Problems:  Mayra Neer 12/27/2020, 12:25 AM

## 2020-12-27 NOTE — Progress Notes (Signed)
Recreation Therapy Notes  INPATIENT RECREATION TR PLAN  Patient Details Name: Christian Bond MRN: [id number] DOB: Jan 19, 2001 Today's Date: 12/27/2020  Rec Therapy Plan Is patient appropriate for Therapeutic Recreation?: Yes Treatment times per week: at least 3 Estimated Length of Stay: 5-7 days TR Treatment/Interventions: Group participation (Comment)  Discharge Criteria Pt will be discharged from therapy if:: Discharged Treatment plan/goals/alternatives discussed and agreed upon by:: Patient/family  Discharge Summary Short term goals set: Patient will engage in groups without prompting or encouragement from LRT x3 group sessions within 5 recreation therapy group sessions Short term goals met: Adequate for discharge Progress toward goals comments: Groups attended Which groups?: Other (Comment) (Problem Solving, Self-care) Reason goals not met: N/A Therapeutic equipment acquired: N/A Reason patient discharged from therapy: Discharge from hospital Pt/family agrees with progress & goals achieved: Yes Date patient discharged from therapy: 12/27/20   Leana Springston 12/27/2020, 11:54 AM

## 2020-12-27 NOTE — Plan of Care (Signed)
  Problem: Group Participation Goal: STG - Patient will engage in groups without prompting or encouragement from LRT x3 group sessions within 5 recreation therapy group sessions Description: STG - Patient will engage in groups without prompting or encouragement from LRT x3 group sessions within 5 recreation therapy group sessions 12/27/2020 1153 by Alveria Apley, LRT Outcome: Adequate for Discharge 12/27/2020 1153 by Alveria Apley, LRT Outcome: Adequate for Discharge

## 2020-12-27 NOTE — Progress Notes (Signed)
D: Pt alert and oriented. Pt rates depression 2/10, hopelessness 2/10, and anxiety 2/10. Pt goal: "Discharge." Pt reports energy level as high and concentration as being good. Pt reports sleep last night as being good. Pt did not receive medications for sleep. Pt denies experiencing any pain at this time. Pt denies experiencing any SI/HI, or AVH at this time.   A: Scheduled medications administered to pt, per MD orders. Support and encouragement provided. Frequent verbal contact made. Routine safety checks conducted q15 minutes.   R: No adverse drug reactions noted. Pt verbally contracts for safety at this time. Pt complaint with medications. Pt interacts well with others on the unit. Pt remains safe at this time. Will continue to monitor.

## 2020-12-27 NOTE — Progress Notes (Addendum)
Recreation Therapy Notes  Date: 12/27/2020  Time: 9:30 am   Location: Craft room   Behavioral response: Appropriate  Intervention Topic: self- care   Discussion/Intervention:  Group content today was focused on Self-Care. The group defined self-care and some positive ways they care for themselves. Individuals expressed ways and reasons why they neglected any self-care in the past. Patients described ways to improve self-care in the future. The group explained what could happen if they did not do any self-care activities at all. The group participated in the intervention "self-care assessment" where they had a chance to discover some of their weaknesses and strengths in self- care. Patient came up with a self-care plan to improve themselves in the future.  Clinical Observations/Feedback: Patient came to group and explained that self-care can be making sure you participate in personal hygiene. Individual was social with staff while participating in the intervention. Perle Gibbon LRT/CTRS         Demetrus Pavao 12/27/2020 11:52 AM

## 2020-12-27 NOTE — Discharge Summary (Signed)
Physician Discharge Summary Note  Patient:  Christian Bond is an 20 y.o., male MRN:  [id number] DOB:  January 25, 2001 Patient phone:  [phone] (home)  Patient address:   [address] [place] [address],  Total Time spent with patient: 30 minutes  Date of Admission:  12/21/2020 Date of Discharge: 12/27/2020  Reason for Admission:  20 year old male with history of MDD and OCD presenting with suicidal ideations and recent ingestion of Lexparo in suicide attempt.   Principal Problem: MDD (major depressive disorder), recurrent episode, severe (HCC) Discharge Diagnoses: Principal Problem:   MDD (major depressive disorder), recurrent episode, severe (HCC) Active Problems:   Obsessive compulsive disorder   Past Psychiatric History: He had a past psychiatric history of OCD since middle school, MDD for 4 years. Previously treated with Lexapro and Buspar. Denies any other past medications. One suicide attempt via ingestion of 5 tablets of Lexapro. No previous hospitalizations.   Past Medical History:  Past Medical History:  Diagnosis Date  . ADHD (attention deficit hyperactivity disorder)   . Allergy     Past Surgical History:  Procedure Laterality Date  . TONSILLECTOMY AND ADENOIDECTOMY     Family History:  Family History  Problem Relation Age of Onset  . Colon polyps Mother   . Stroke Maternal Uncle   . Heart disease Maternal Grandfather   . Hyperlipidemia Maternal Grandfather   . Stroke Maternal Grandfather    Family Psychiatric  History: Maternal grandmother,unknown diagnosis and brother-mild depression Social History:  Social History   Substance and Sexual Activity  Alcohol Use No     Social History   Substance and Sexual Activity  Drug Use No    Social History   Socioeconomic History  . Marital status: Single    Spouse name: Not on file  . Number of children: Not on file  . Years of education: Not on file  . Highest education level: Not on file  Occupational  History  . Not on file  Tobacco Use  . Smoking status: Never Smoker  . Smokeless tobacco: Never Used  Vaping Use  . Vaping Use: Some days  Substance and Sexual Activity  . Alcohol use: No  . Drug use: No  . Sexual activity: Not on file  Other Topics Concern  . Not on file  Social History Narrative  . Not on file   Social Determinants of Health   Financial Resource Strain: Not on file  Food Insecurity: Not on file  Transportation Needs: Not on file  Physical Activity: Not on file  Stress: Not on file  Social Connections: Not on file    Hospital Course:  20 year old male with history of MDD and OCD presenting with suicidal ideations and recent ingestion of Lexparo in suicide attempt. Patient endorsed multiple symptoms of depression on admission including poor sleep, tiredness, poor concentration, depressed mood, suicidal ideations, and recent suicide attempt via ingestion. He also endorsed symptoms of psychosis including hearing voices outside of his body telling him to hurt himself. He also endorsed symptoms of OCD with mental contamination with intrusive thoughts that were disturbing and sexual in nature. He endorsed compulsions to chant mantras in his heads to make those thoughts go away. Wellbutrin discontinued on admission, and he was continued on Buspar 10 mg BID, and Lexparo 20 mg daily. Abilify was added for psychosis and titrated to 15 mg daily. On this combination psychosis resolved as evidenced by resolution of thought blocking and signs of responding to internal stimuli. Mood improved, and  anxiety decreased to point he was able to socailize on the unit with peers and attend groups. At time of discharge he denied any suicidal ideations, homicidal ideations, visual hallucinations, and auditory hallucinations. He plans to follow up with with [facility] for outpatient treatment while he awaits acceptance into [facility] in [place]. Mother was contacted prior to discharge to discuss  diagnosis, treatment, and plan of care. She reports feelings that Christian Bond have improved as well, and feels safe with him returning home with outpatient follow-up.  Physical Findings: AIMS: Facial and Oral Movements Muscles of Facial Expression: None, normal Lips and Perioral Area: None, normal Jaw: None, normal Tongue: None, normal,Extremity Movements Upper (arms, wrists, hands, fingers): None, normal Lower (legs, knees, ankles, toes): None, normal, Trunk Movements Neck, shoulders, hips: None, normal, Overall Severity Severity of abnormal movements (highest score from questions above): None, normal Incapacitation due to abnormal movements: None, normal Patient's awareness of abnormal movements (rate only patient's report): No Awareness, Dental Status Current problems with teeth and/or dentures?: No Does patient usually wear dentures?: No  CIWA:    COWS:     Musculoskeletal: Strength & Muscle Tone: within normal limits Gait & Station: normal Patient leans: N/A  Psychiatric Specialty Exam: Physical Exam Vitals and nursing note reviewed.  Constitutional:      Appearance: Normal appearance.  HENT:     Head: Normocephalic and atraumatic.     Right Ear: External ear normal.     Left Ear: External ear normal.     Nose: Nose normal.     Mouth/Throat:     Mouth: Mucous membranes are moist.     Pharynx: Oropharynx is clear.  Eyes:     Extraocular Movements: Extraocular movements intact.     Conjunctiva/sclera: Conjunctivae normal.     Pupils: Pupils are equal, round, and reactive to light.  Cardiovascular:     Rate and Rhythm: Normal rate.     Pulses: Normal pulses.  Pulmonary:     Effort: Pulmonary effort is normal.     Breath sounds: Normal breath sounds.  Abdominal:     General: Abdomen is flat.     Palpations: Abdomen is soft.  Musculoskeletal:        General: No swelling. Normal range of motion.     Cervical back: Normal range of motion and neck supple.  Skin:     General: Skin is warm and dry.  Neurological:     General: No focal deficit present.     Mental Status: He is alert and oriented to person, place, and time.  Psychiatric:        Mood and Affect: Mood normal.        Behavior: Behavior normal.        Thought Content: Thought content normal.        Judgment: Judgment normal.     Review of Systems  Constitutional: Negative for appetite change and fatigue.  HENT: Negative for rhinorrhea and sore throat.   Eyes: Negative for photophobia and visual disturbance.  Respiratory: Negative for cough and shortness of breath.   Cardiovascular: Negative for chest pain and palpitations.  Gastrointestinal: Negative for constipation, diarrhea, nausea and vomiting.  Endocrine: Negative for cold intolerance and heat intolerance.  Genitourinary: Negative for difficulty urinating and dysuria.  Musculoskeletal: Negative for arthralgias and myalgias.  Skin: Negative for rash and wound.  Allergic/Immunologic: Negative for food allergies and immunocompromised state.  Neurological: Negative for dizziness and headaches.  Hematological: Negative for adenopathy. Does not bruise/bleed  easily.  Psychiatric/Behavioral: Negative for behavioral problems, hallucinations, sleep disturbance and suicidal ideas.    Blood pressure 129/83, pulse 82, temperature 97.8 F (36.6 C), temperature source Oral, resp. rate 18, height 5\' 10"  (1.778 m), weight 58.1 kg, SpO2 98 %.Body mass index is 18.37 kg/m.  General Appearance: Well Groomed  Patent attorney::  Good  Speech:  Clear and Coherent and Normal Rate  Volume:  Normal  Mood:  Euthymic  Affect:  Congruent  Thought Process:  Coherent and Linear  Orientation:  Full (Time, Place, and Person)  Thought Content:  Logical  Suicidal Thoughts:  No  Homicidal Thoughts:  No  Memory:  Immediate;   Fair Recent;   Fair Remote;   Fair  Judgement:  Fair  Insight:  Good  Psychomotor Activity:  Normal  Concentration:  Fair  Recall:   Fair  Fund of Knowledge:Fair  Language: Fair  Akathisia:  Negative  Handed:  Right  AIMS (if indicated):     Assets:  Communication Skills Desire for Improvement Financial Resources/Insurance Housing Leisure Time Physical Health Resilience Social Support Transportation  Sleep:  Number of Hours: 8.45  Cognition: WNL  ADL's:  Intact        Have you used any form of tobacco in the last 30 days? (Cigarettes, Smokeless Tobacco, Cigars, and/or Pipes): No  Has this patient used any form of tobacco in the last 30 days? (Cigarettes, Smokeless Tobacco, Cigars, and/or Pipes) No  Blood Alcohol level:  Lab Results  Component Value Date   ETH <10 12/20/2020    Metabolic Disorder Labs:  Lab Results  Component Value Date   HGBA1C 5.0 12/22/2020   MPG 96.8 12/22/2020   No results found for: PROLACTIN Lab Results  Component Value Date   CHOL 218 (H) 12/22/2020   TRIG 60 12/22/2020   HDL 46 12/22/2020   CHOLHDL 4.7 12/22/2020   VLDL 12 12/22/2020   LDLCALC 160 (H) 12/22/2020    See Psychiatric Specialty Exam and Suicide Risk Assessment completed by Attending Physician prior to discharge.  Discharge destination:  Home  Is patient on multiple antipsychotic therapies at discharge:  No   Has Patient had three or more failed trials of antipsychotic monotherapy by history:  No  Recommended Plan for Multiple Antipsychotic Therapies: NA  Discharge Instructions    Diet general   Complete by: As directed    Increase activity slowly   Complete by: As directed      Allergies as of 12/27/2020      Reactions   Amoxicillin Rash   Erythema multiforma   Penicillins Other (See Comments)   Blisters.      Medication List    STOP taking these medications   buPROPion 150 MG 24 hr tablet Commonly known as: Wellbutrin XL     TAKE these medications     Indication  ARIPiprazole 15 MG tablet Commonly known as: ABILIFY Take 1 tablet (15 mg total) by mouth daily.  Indication:  Major Depressive Disorder, Obsessive Compulsive Disorder   busPIRone 10 MG tablet Commonly known as: BUSPAR Take 1 tablet (10 mg total) by mouth 2 (two) times daily.  Indication: Anxiety Disorder, Major Depressive Disorder   escitalopram 20 MG tablet Commonly known as: LEXAPRO Take 1 tablet (20 mg total) by mouth daily.  Indication: Generalized Anxiety Disorder, Major Depressive Disorder, Obsessive Compulsive Disorder   fexofenadine 180 MG tablet Commonly known as: ALLEGRA Take 180 mg by mouth daily.  Indication: Hayfever   hydrOXYzine 25 MG tablet Commonly  known as: ATARAX/VISTARIL Take 1 tablet (25 mg total) by mouth 3 (three) times daily as needed for anxiety. What changed:   how much to take  how to take this  when to take this  reasons to take this  additional instructions  Another medication with the same name was removed. Continue taking this medication, and follow the directions you see here.  Indication: Feeling Anxious       Follow-up Information    [facility] Follow up.   Specialty: Behavioral Health Why: Appointment is scheduled for 01/03/2021 at 2:00PM via video in My Chart.  Thanks! Contact information: [address] Medical [facility] [place] [address] [phone]       [facility] Follow up.   Why: [facility] is currently reviewing financial paperwork with your parents.  Current waitlist is about 3 weeks long.  They will follow up when bed is available.   Contact information: [address] [place], [place] [address] [phone] Fax: [phone]              Follow-up recommendations:  Activity:  as tolerated Diet:  regular diet  Comments:  30-day scripts with one refill since to [facility] employee pharmacy per patient and family request.   Signed: Jesse Sans, MD 12/27/2020, 10:22 AM

## 2020-12-27 NOTE — Progress Notes (Signed)
  [facility] Adult Case Management Discharge Plan :  Will you be returning to the same living situation after discharge:  Yes,  pt plans to return to his parents' home. At discharge, do you have transportation home?: Yes,  mother to provide transport home. Do you have the ability to pay for your medications: Yes,  Christian Bond.  Release of information consent forms completed and in the chart;  Patient's signature needed at discharge.  Patient to Follow up at:  Follow-up Information    [facility] Follow up.   Specialty: Behavioral Health Why: Appointment is scheduled for 01/03/2021 at 2:00PM via video in My Chart.  Thanks! Contact information: [address] Medical [facility] [place] [address] [phone]       [facility] Follow up.   Why: [facility] is currently reviewing financial paperwork with your parents.  Current waitlist is about 3 weeks long.  They will follow up when bed is available.   Contact information: [address] [place], [place] [address] [phone] Fax: [phone]              Next level of care provider has access to [facility] Link:yes  Safety Planning and Suicide Prevention discussed: Yes,  SPE completed with Christian Bond, mother.  Have you used any form of tobacco in the last 30 days? (Cigarettes, Smokeless Tobacco, Cigars, and/or Pipes): No  Has patient been referred to the Quitline?: N/A patient is not a smoker  Patient has been referred for addiction treatment: N/A  [name], LCSW 12/27/2020, 9:19 AM

## 2021-01-03 ENCOUNTER — Other Ambulatory Visit: Payer: Self-pay

## 2021-01-03 ENCOUNTER — Other Ambulatory Visit: Payer: Self-pay | Admitting: Child and Adolescent Psychiatry

## 2021-01-03 ENCOUNTER — Telehealth (INDEPENDENT_AMBULATORY_CARE_PROVIDER_SITE_OTHER): Payer: 59 | Admitting: Child and Adolescent Psychiatry

## 2021-01-03 DIAGNOSIS — F418 Other specified anxiety disorders: Secondary | ICD-10-CM | POA: Diagnosis not present

## 2021-01-03 DIAGNOSIS — F422 Mixed obsessional thoughts and acts: Secondary | ICD-10-CM | POA: Diagnosis not present

## 2021-01-03 DIAGNOSIS — F331 Major depressive disorder, recurrent, moderate: Secondary | ICD-10-CM | POA: Diagnosis not present

## 2021-01-03 MED ORDER — LATUDA 20 MG PO TABS: 20.0000 mg | ORAL_TABLET | Freq: Every day | ORAL | 0 refills | Status: DC

## 2021-01-03 NOTE — Progress Notes (Signed)
Virtual Visit via Video Note  I connected with Christian Bond on 01/03/21 at  2:00 PM EST by a video enabled telemedicine application and verified that I am speaking with the correct person using two identifiers.  Location: Patient: home Provider: office   I discussed the limitations of evaluation and management by telemedicine and the availability of in person appointments. The patient expressed understanding and agreed to proceed.   I discussed the assessment and treatment plan with the patient. The patient was provided an opportunity to ask questions and all were answered. The patient agreed with the plan and demonstrated an understanding of the instructions.   The patient was advised to call back or seek an in-person evaluation if the symptoms worsen or if the condition fails to improve as anticipated.  I provided 30 minutes of non-face-to-face time during this encounter.   [name], MD     [facility] MD/PA/NP OP Progress Note  01/03/2021 6:31 PM Christian Bond  MRN:  [id number]  Chief Complaint: Post discharge medication management follow-up for depression, anxiety, OCD and ADHD.  HPI:   This is a 20 year old Caucasian male, domiciled with biological parents and 30 year old brother, was attending [employer] for this fall semester but dropped out about after being there for 3 weeks.  His initial presentation appeared consistent with severe anxiety, depression, OCD and ADHD.  At the last appointment he was recommended to increase Lexapro to 20 mg once a day and continue with BuSpar to 10 mg 2 times a day. He was also recommneded to add Wellbutrin XL 150 mg daily. He cancelled his last scheduled appointment and since then was admitted to [facility] inpatient for about a week due to SI and OD on Lexapro in a suicide attempt. He was started on Abilify and dose was increased to 15 mg once a day in addition to most part 10 mg 3 times a day and Lexapro 20 mg once a day.  He was present at his home  for today's appointment.  During evaluation today his speech appeared spontaneous, did not appear to have thought blocking, he appeared linear and goal-directed in his thought process and his affect was more reactive as compared to previous appointments.  He corroborated the hx that lead to his hospitalization.   He reports that hospitalization was helpful.  When asked what was helpful he reports that being in groups and he learned to stay focused on present rather than worrying too much about future or past.  He reports that he has not been feeling depressed or anxious as he was prior to hospitalization.  He reports that he feels "much lighter", and reports that his thinking has been more clearer than before. He rates his mood at 7-8/10 (10 = happiest). He reports that he used to have very frequent SI prior to hospitalization, and now his SI have been occurring every 2-3 days on average for brief period of time, without intent or plan. He reports that he will speak with his parents if he does not feel safe. He also reports improvement in anxiety and attributes improvement to focus on today rather than worrying about future however he still sometimes overthink about future which overwhelms and makes him anxious. He also reports improvement with OCD, less intrussive thoughts on contaminations, sexual thoughts. He denies AVH and did not admit delusions.   He reports to me that he has stopped taking Abilify about 4 days ago because he was feeling more anxious and on edge  on Abilify. He reports that he does not want to continue taking Abilify and also asked if he can discontinue Buspar because he has noticed palpitations on it. On further exploration, it appeared that palpitations appears to be caused by anxiety rather than buspar and recommended to continue with Buspar for now. We also discussed that Abilify appears to have improved his psychotic symptoms, thought blocking, his thoughts are more clearer. We  discussed alternatives, including Risperdal but he did not want to try Risperdal. We discussed a trial of Latuda to which he provided informed consent after discussing risks and benefits. Discussed to start at Latuda 20 mg daily with breakfast.   He also reports that he started seeing his previous therapist in [place], and will be following up every week.   He reports that he is [facility] for [facility] but he is thinking to not go there. We discussed pros and cons and he reports that he will continue to think about whether he wants to go or not.   He also reports that he is looking for a job and planning to go to [employer] this fall.   He reports that things are going well at home, less arguments.   He provided verbal consent to speak with his mother and only provide information regarding his treatment if she requests but can obtain collateral information from her.   Mother reports that he is doing much better, getting out of bed, being productive, doing things around house, had one little episode (panic attack - talked irrational) when he travelled with uncle and cousin to [place]. He seems to be doing much better. He does agree with intensive treatment, but ambivalent.  Mother reports that he discontinued Abilify and told her that Clinical research associate suggested Jordan. Mother reports that he looks up medications online and gets anxious. She reports that she will continue to encourage him to go to [facility] or do intensive outpatient at [facility].    Visit Diagnosis:    ICD-10-CM   1. Other specified anxiety disorders  F41.8   2. Moderate episode of recurrent major depressive disorder (HCC)  F33.1   3. Mixed obsessional thoughts and acts  F42.2     Past Psychiatric History:  As mentioned in initial H&P, reviewed today, no change  Past Medical History:  Past Medical History:  Diagnosis Date  . ADHD (attention deficit hyperactivity disorder)   . Allergy     Past Surgical History:  Procedure  Laterality Date  . TONSILLECTOMY AND ADENOIDECTOMY      Family Psychiatric History:   No family history is available from father side of the family because father is adopted Maternal aunt with bipolar disorder/schizophrenia/developmental delays [place] aunt with bipolar disorder and schizophrenia Maternal grandfather with alcoholism No family history of suicide.  Family History:  Family History  Problem Relation Age of Onset  . Colon polyps Mother   . Stroke Maternal Uncle   . Heart disease Maternal Grandfather   . Hyperlipidemia Maternal Grandfather   . Stroke Maternal Grandfather     Social History:  Social History   Socioeconomic History  . Marital status: Single    Spouse name: Not on file  . Number of children: Not on file  . Years of education: Not on file  . Highest education level: Not on file  Occupational History  . Not on file  Tobacco Use  . Smoking status: Never Smoker  . Smokeless tobacco: Never Used  Vaping Use  . Vaping Use: Some  days  Substance and Sexual Activity  . Alcohol use: No  . Drug use: No  . Sexual activity: Not on file  Other Topics Concern  . Not on file  Social History Narrative  . Not on file   Social Determinants of Health   Financial Resource Strain: Not on file  Food Insecurity: Not on file  Transportation Needs: Not on file  Physical Activity: Not on file  Stress: Not on file  Social Connections: Not on file    Allergies:  Allergies  Allergen Reactions  . Amoxicillin Rash    Erythema multiforma  . Penicillins Other (See Comments)    Blisters.    Metabolic Disorder Labs: Lab Results  Component Value Date   HGBA1C 5.0 12/22/2020   MPG 96.8 12/22/2020   No results found for: PROLACTIN Lab Results  Component Value Date   CHOL 218 (H) 12/22/2020   TRIG 60 12/22/2020   HDL 46 12/22/2020   CHOLHDL 4.7 12/22/2020   VLDL 12 12/22/2020   LDLCALC 160 (H) 12/22/2020   Lab Results  Component Value Date   TSH 0.70  11/24/2020    Therapeutic Level Labs: No results found for: LITHIUM No results found for: VALPROATE No components found for:  CBMZ  Current Medications: Current Outpatient Medications  Medication Sig Dispense Refill  . lurasidone (LATUDA) 20 MG TABS tablet Take 1 tablet (20 mg total) by mouth daily. 30 tablet 0  . busPIRone (BUSPAR) 10 MG tablet Take 1 tablet (10 mg total) by mouth 2 (two) times daily. 60 tablet 1  . escitalopram (LEXAPRO) 20 MG tablet Take 1 tablet (20 mg total) by mouth daily. 30 tablet 1  . fexofenadine (ALLEGRA) 180 MG tablet Take 180 mg by mouth daily.    . hydrOXYzine (ATARAX/VISTARIL) 25 MG tablet Take 1 tablet (25 mg total) by mouth 3 (three) times daily as needed for anxiety. 90 tablet 1   No current facility-administered medications for this visit.     Musculoskeletal: Strength & Muscle Tone: unable to assess since visit was over the telemedicine. Gait & Station: unable to assess since visit was over the telemedicine. Patient leans: N/A  Psychiatric Specialty Exam: Review of Systems  There were no vitals taken for this visit.There is no height or weight on file to calculate BMI.  General Appearance: Casual and Fairly Groomed  Eye Contact:  Fair  Speech:  Clear and Coherent and Normal Rate  Volume:  Normal  Mood:  "good"  Affect:  Appropriate, Congruent and Restricted  Thought Process:  Linear, Goal directed  Orientation:  Full (Time, Place, and Person)  Thought Content: Logical and Obsessions   Suicidal Thoughts:  No  Homicidal Thoughts:  No  Memory:  Immediate;   Fair Recent;   Fair Remote;   Fair  Judgement:  Fair  Insight:  Fair  Psychomotor Activity:  Normal  Concentration:  Concentration: Fair and Attention Span: Fair  Recall:  [employer] of Knowledge: Fair  Language: Fair      AIMS (if indicated): not done  Assets:  Manufacturing systems engineer Desire for Improvement Financial Resources/Insurance Housing Leisure Time Physical  Health Social Support Transportation Vocational/Educational  ADL's:  Intact  Cognition: WNL  Sleep:  Fair   Screenings: AIMS   Flowsheet Row Admission (Discharged) from 12/21/2020 in [facility] INPATIENT BEHAVIORAL MEDICINE  AIMS Total Score 0    AUDIT   Flowsheet Row Admission (Discharged) from 12/21/2020 in [facility] INPATIENT BEHAVIORAL MEDICINE  Alcohol Use Disorder Identification Test Final  Score (AUDIT) 0    PHQ2-9   Flowsheet Row Office Visit from 07/22/2020 in [facility] Office Visit from 06/22/2019 in [facility]  PHQ-2 Total Score 0 0    Flowsheet Row Admission (Discharged) from 12/21/2020 in [facility] INPATIENT BEHAVIORAL MEDICINE ED from 12/20/2020 in [facility]ENCY [facility]PART[facility]  C-SSRS RISK CATEGORY Error: Q3, 4, or 5 should not be populated when Q2 is No Error: Question 6 not populated       Assessment and Plan:   20 year old male with most likely dx of Generalized and Social Anxiety disorders, OCD, Depression, ADHD presented for post discharge follow up. He had hospitalization at [facility] for a week due to SI and OD for suicide attempt.   He appeared more clearer in his thought process, very less thought blocking, affect was more reactive, and he was more engaged. His mother also reports improvement. He has stopped taking Abilify which I believe has reduced his psychotic symptoms and he does not want to try Risperdal, agrees to try Jordan. He agreed to go to [facility] RTC but now ambivalent about it. Denies any SI at present, reports intermittent SI without intent or plan with good self control and reports overall improvement in SI as compare to prior to hospitalization. He has long hx of intermittent SI and appears to be chronic in nature. We discussed the plan as below.   Plan:   - Continue with Lexapro 20 mg daily  t.  - Start Latuda 20 mg daily.  - Recommended ind therapy, and started seeing his old therapist in  [place] and will be following up every week - Continue Atarax 12.5-25 mg TID PRN for anxiety/panic attacks and QHS PRN for sleep  - Continue BuSpar  10 mg 2 times a day. - Recommended to RTC at [facility] which he reports that he will think about.    Follow-up in 4 weeks or earlier if needed.    This note was generated in part or whole with voice recognition software. Voice recognition is usually quite accurate but there are transcription errors that can and very often do occur. I apologize for any typographical errors that were not detected and corrected.      [name], MD 01/03/2021, 6:31 PM

## 2021-01-18 DIAGNOSIS — Z03818 Encounter for observation for suspected exposure to other biological agents ruled out: Secondary | ICD-10-CM | POA: Diagnosis not present

## 2021-01-18 DIAGNOSIS — Z20822 Contact with and (suspected) exposure to covid-19: Secondary | ICD-10-CM | POA: Diagnosis not present

## 2021-01-19 DIAGNOSIS — F12959 Cannabis use, unspecified with psychotic disorder, unspecified: Secondary | ICD-10-CM | POA: Insufficient documentation

## 2021-01-25 ENCOUNTER — Telehealth: Payer: 59 | Admitting: Child and Adolescent Psychiatry

## 2021-01-25 ENCOUNTER — Other Ambulatory Visit: Payer: Self-pay

## 2021-01-25 ENCOUNTER — Telehealth: Payer: Self-pay | Admitting: Child and Adolescent Psychiatry

## 2021-01-25 NOTE — Telephone Encounter (Signed)
Pt was sent link via text and email to connect on video for telemedicine encounter for scheduled appointment, and was also followed up with phone call. Pt did not connect on the video, and writer left the VM requesting to connect on the video or call back to reschedule appointment if they are not able to connect today for appointment.   Writer also followed up with mother via phone call, she reports that she will let Susana know to connect, but joy did not connect and was marked as no show at 11:13 AM. Mother informed this Clinical research associate that Alioune went to [facility] but had a panic attack and left AMA in one day and yesterday they talked at [facility] for their intensive outpatient program and planning to start PHP there. She was informed to call the clinic and reschedule if Orren is not able to connect today for the appointment.

## 2021-02-15 ENCOUNTER — Other Ambulatory Visit: Payer: Self-pay | Admitting: Psychiatry

## 2021-02-26 ENCOUNTER — Other Ambulatory Visit: Payer: Self-pay

## 2021-03-15 ENCOUNTER — Other Ambulatory Visit: Payer: Self-pay

## 2021-03-15 ENCOUNTER — Telehealth: Payer: Self-pay | Admitting: Family Medicine

## 2021-03-15 DIAGNOSIS — F902 Attention-deficit hyperactivity disorder, combined type: Secondary | ICD-10-CM

## 2021-03-15 DIAGNOSIS — F429 Obsessive-compulsive disorder, unspecified: Secondary | ICD-10-CM

## 2021-03-15 DIAGNOSIS — F422 Mixed obsessional thoughts and acts: Secondary | ICD-10-CM

## 2021-03-15 DIAGNOSIS — F331 Major depressive disorder, recurrent, moderate: Secondary | ICD-10-CM

## 2021-03-15 DIAGNOSIS — F418 Other specified anxiety disorders: Secondary | ICD-10-CM

## 2021-03-15 DIAGNOSIS — F333 Major depressive disorder, recurrent, severe with psychotic symptoms: Secondary | ICD-10-CM

## 2021-03-15 MED ORDER — ESCITALOPRAM OXALATE 20 MG PO TABS
20.0000 mg | ORAL_TABLET | Freq: Every day | ORAL | 1 refills | Status: DC
  Filled 2021-03-15: qty 30, 30d supply, fill #0

## 2021-03-15 NOTE — Telephone Encounter (Signed)
I called and spoke with the patient and informed him the referral was placed and they will get in touch with him, also patient stated his is only taking the lexapro consistently.  Brett Soza,cma

## 2021-03-15 NOTE — Addendum Note (Signed)
Addended by: Glori Luis on: 03/15/2021 01:22 PM   Modules accepted: Orders

## 2021-03-15 NOTE — Telephone Encounter (Signed)
PT called he is needing refills on busPIRone (BUSPAR) 10 MG tablet and escitalopram (LEXAPRO) 20 MG tablet. Sent to [facility]. PT stated he is no longer seeing Dr.U at [facility] psychiatry due to no shows and other reasons. PT would also like a referral to a new psych.

## 2021-03-15 NOTE — Addendum Note (Signed)
Addended by: Glori Luis on: 03/15/2021 03:15 PM   Modules accepted: Orders

## 2021-03-15 NOTE — Telephone Encounter (Signed)
I refilled the lexapro. If he has not been taking the buspar then I am not sure if we would need to restart it without completing a visit with him. I would like to see him in the next week or two and he could be scheduled in a 15 minute slot if needed.

## 2021-03-15 NOTE — Telephone Encounter (Signed)
I placed a referral to a new psychiatrist.  Please find out if he has consistently been taking the BuSpar and the Lexapro.  Thanks.

## 2021-03-16 NOTE — Telephone Encounter (Signed)
I called and scheduled the patient with the provider in 2 weeks.  Carmon Brigandi,cma

## 2021-03-24 ENCOUNTER — Other Ambulatory Visit: Payer: Self-pay

## 2021-03-28 ENCOUNTER — Other Ambulatory Visit: Payer: Self-pay

## 2021-03-31 ENCOUNTER — Ambulatory Visit: Payer: 59 | Admitting: Family Medicine

## 2021-03-31 DIAGNOSIS — Z0289 Encounter for other administrative examinations: Secondary | ICD-10-CM

## 2021-04-12 MED FILL — Hydroxyzine HCl Tab 25 MG: ORAL | 30 days supply | Qty: 90 | Fill #0 | Status: AC

## 2021-04-13 ENCOUNTER — Other Ambulatory Visit: Payer: Self-pay

## 2021-04-17 ENCOUNTER — Other Ambulatory Visit: Payer: Self-pay

## 2021-04-25 ENCOUNTER — Telehealth: Payer: Self-pay | Admitting: Family Medicine

## 2021-04-25 ENCOUNTER — Ambulatory Visit: Payer: 59 | Admitting: Family Medicine

## 2021-04-25 NOTE — Telephone Encounter (Signed)
LVM for patient to call back.  Ni a,cma

## 2021-04-25 NOTE — Telephone Encounter (Signed)
Noted. Agree with testing. If they need anything they should let us know. He should remain quarantined at home. If they could let us know the result that would be appreciated. Please also find out what symptoms he is having. Thanks.

## 2021-04-25 NOTE — Telephone Encounter (Signed)
Patient's mother called in stated that he was exposed to covid and now he is having symptoms going to [facility] to be tested today

## 2021-05-01 ENCOUNTER — Ambulatory Visit: Payer: 59 | Admitting: Family Medicine

## 2021-05-17 ENCOUNTER — Other Ambulatory Visit: Payer: Self-pay

## 2021-05-17 ENCOUNTER — Encounter: Payer: Self-pay | Admitting: Child and Adolescent Psychiatry

## 2021-05-17 ENCOUNTER — Telehealth (INDEPENDENT_AMBULATORY_CARE_PROVIDER_SITE_OTHER): Payer: 59 | Admitting: Child and Adolescent Psychiatry

## 2021-05-17 DIAGNOSIS — F418 Other specified anxiety disorders: Secondary | ICD-10-CM | POA: Insufficient documentation

## 2021-05-17 DIAGNOSIS — F422 Mixed obsessional thoughts and acts: Secondary | ICD-10-CM | POA: Diagnosis not present

## 2021-05-17 DIAGNOSIS — F332 Major depressive disorder, recurrent severe without psychotic features: Secondary | ICD-10-CM

## 2021-05-17 MED ORDER — ESCITALOPRAM OXALATE 10 MG PO TABS
10.0000 mg | ORAL_TABLET | Freq: Every day | ORAL | 0 refills | Status: DC
  Filled 2021-05-17: qty 30, 30d supply, fill #0

## 2021-05-17 MED ORDER — HYDROXYZINE HCL 25 MG PO TABS
ORAL_TABLET | Freq: Three times a day (TID) | ORAL | 1 refills | Status: DC | PRN
  Filled 2021-05-17: qty 90, 30d supply, fill #0

## 2021-05-17 MED ORDER — BUSPIRONE HCL 10 MG PO TABS
ORAL_TABLET | Freq: Two times a day (BID) | ORAL | 1 refills | Status: DC
  Filled 2021-05-17: qty 60, 30d supply, fill #0

## 2021-05-17 NOTE — Progress Notes (Signed)
Virtual Visit via Video Note  I connected with [place] on 05/17/21 at 11:00 AM EDT by a video enabled telemedicine application and verified that I am speaking with the correct person using two identifiers.  Location: Patient: home Provider: office   I discussed the limitations of evaluation and management by telemedicine and the availability of in person appointments. The patient expressed understanding and agreed to proceed.   I discussed the assessment and treatment plan with the patient. The patient was provided an opportunity to ask questions and all were answered. The patient agreed with the plan and demonstrated an understanding of the instructions.   The patient was advised to call back or seek an in-person evaluation if the symptoms worsen or if the condition fails to improve as anticipated.  I provided 30 minutes of non-face-to-face time during this encounter.   Christian Smalling, MD     [facility] MD/PA/NP OP Progress Note  05/17/2021 12:40 PM [place]  MRN:  [id number]  Chief Complaint: Medication management follow-up for depression, anxiety, OCD and ADHD.  HPI:   This is a 20 year old Caucasian male, domiciled with biological parents and 12 year old brother, was attending [employer] previously but dropped out about after being there for 3 weeks.  His initial presentation appeared consistent with severe anxiety, depression, OCD and ADHD.  At his last appointment he was recommended to continue with Lexapro 20 mg once a day, BuSpar 10 mg twice a day, and start Latuda 20 mg once a day.  He subsequently no showed for his appointment back in March and made this appointment today for medication management follow-up.    He was present by himself at his home and was evaluated alone.  With his verbal informed consent I spoke with his mother to obtain collateral information and discuss her treatment plan with her.  Patient's father also reached out to this Clinical research associate to volunteer  collateral information on June 21 which is mentioned below at the end of HPI section.  In the interim since last appointment he reports that he attended intensive outpatient program for about 6 weeks.  He reports that it was somewhat helpful and they have not made any changes to his medications except that he was started on Seroquel 25 mg at night as needed for sleep.  He reports that he never tried [place] and was also not taking any medications at least since last 1 month until last week.  He reports that he restarted taking his Lexapro 10 mg once a day and BuSpar 10 mg twice a day after talking to his mother for his anxiety.  When asked to describe his anxiety he states "I do not know", could not tell if the anxiety is constant or intermittent or triggered by any specific triggers.  He reports that he has been doing okay with his OCD.  In regards of mood he reports that his mood has been "angry".  When asked for any triggers for anger he reports that he is angry at most of the things.  He reports that usually he is able to manage his anger well but sometimes he destroys property and becomes aggressive.  He denies any suicidal thoughts today or recently but reports that he was having suicidal thoughts without intent or plan, and unable to elaborate when he was having suicidal thoughts.  He denies any homicidal thoughts.  He denies having any access to firearms or guns.  When asked what does he do during the day he  reports that he tries to stay busy with "things".  He reports that he continues to use marijuana,Delta 8/9 and nicotine vape and reports that it helps him relax.  He does not see any problems using these substances.  I discussed with him to continue with Lexapro 10 mg once a day, BuSpar 10 mg twice a day, hydroxyzine as needed for anxiety attacks and Seroquel 25 mg at night as needed for sleep.  We discussed to reassess in about 3 weeks and increase the dose back to 20 mg which he was taking  previously and had partial improvement.  His mother reports that [place] restarted taking medication about a week ago, continues to struggle with anxiety and marijuana use.  She reports that he continues to stay mostly in his room, struggles with eating, and taking care of himself.  She reports that they are waiting to get [facility].  I discussed the medication plan as mentioned above with her.  Collateral information from father over Epic message - "[place] is scheduled to see you tomorrow and just wanted to update you since he doesn't tend to volunteer much information. He continues to have what I think is severe social anxiety and self loathing. He self-medicates by continuously vaping on delta-8. He is usually quiet and calm, but has occasional episodes of rage during which he destroys his possessions such as his guitars, amps, televisions etc. These seem to be brought on by anger and frustration at himself, but also tend to occur when he runs out of money to buy delta-8 vapes. He had one of these episodes in January and told his mother that he took 5 or 6 lexapros and buspars to make his panic attacks go away. He didn't show any symptoms of actually overdosing, but it was construed as a suicide attempt to get him committed to [facility] for a week... but it wasn't really a suicide attempt. Otherwise I think his diagnoses were pretty accurate. The hospitalization did get him off the delta-8 briefly and he was in a good mood for a few weeks. He took the Lexapro for a month or so afterwards but it wasn't long before he went back to self-medicating with delta-8 and stopped the Lexapro.   He did go through 6 weeks of intensive outpatient therapy [facility] in the [facility]. This seemed to have helped briefly, but he really hates counseling, the idea of talking about himself aggravates his anxiety and self loathing.   He had a really bad rage episode last week,  after which he agreed to start back on the lexapro and buspar. It's probably worth giving these meds another try, but  I'm not sure if the Lexapro has been helpful. He was taking it prior to being hospitalized in January and he didn't really seem much better, but it's hard to judge since he was also using so much delta-8. The delta-8 vape makes his anxiey go away, but he's incapabable of self regulating,  and it's impairing his thought processes. He also just goes nuts when he runs out.  Thank you so much for your patience."     Visit Diagnosis:    ICD-10-CM   1. Other specified anxiety disorders  F41.8 busPIRone (BUSPAR) 10 MG tablet    escitalopram (LEXAPRO) 10 MG tablet    hydrOXYzine (ATARAX/VISTARIL) 25 MG tablet    2. Severe episode of recurrent major depressive disorder, without psychotic features (HCC)  F33.2 escitalopram (LEXAPRO)  10 MG tablet    3. Mixed obsessional thoughts and acts  F42.2 escitalopram (LEXAPRO) 10 MG tablet      Past Psychiatric History:  As mentioned in initial H&P, reviewed today, no change  Past Medical History:  Past Medical History:  Diagnosis Date   ADHD (attention deficit hyperactivity disorder)    Allergy     Past Surgical History:  Procedure Laterality Date   TONSILLECTOMY AND ADENOIDECTOMY      Family Psychiatric History:   No family history is available from father side of the family because father is adopted Maternal aunt with bipolar disorder/schizophrenia/developmental delays [place] aunt with bipolar disorder and schizophrenia Maternal grandfather with alcoholism No family history of suicide.  Family History:  Family History  Problem Relation Age of Onset   Colon polyps Mother    Stroke Maternal Uncle    Heart disease Maternal Grandfather    Hyperlipidemia Maternal Grandfather    Stroke Maternal Grandfather     Social History:  Social History   Socioeconomic History   Marital status: Single    Spouse name: Not on file    Number of children: Not on file   Years of education: Not on file   Highest education level: Not on file  Occupational History   Not on file  Tobacco Use   Smoking status: Never   Smokeless tobacco: Never  Vaping Use   Vaping Use: Some days  Substance and Sexual Activity   Alcohol use: No   Drug use: No   Sexual activity: Not on file  Other Topics Concern   Not on file  Social History Narrative   Not on file   Social Determinants of Health   Financial Resource Strain: Not on file  Food Insecurity: Not on file  Transportation Needs: Not on file  Physical Activity: Not on file  Stress: Not on file  Social Connections: Not on file    Allergies:  Allergies  Allergen Reactions   Amoxicillin Rash    Erythema multiforma   Penicillins Other (See Comments)    Blisters.    Metabolic Disorder Labs: Lab Results  Component Value Date   HGBA1C 5.0 12/22/2020   MPG 96.8 12/22/2020   No results found for: PROLACTIN Lab Results  Component Value Date   CHOL 218 (H) 12/22/2020   TRIG 60 12/22/2020   HDL 46 12/22/2020   CHOLHDL 4.7 12/22/2020   VLDL 12 12/22/2020   LDLCALC 160 (H) 12/22/2020   Lab Results  Component Value Date   TSH 0.70 11/24/2020    Therapeutic Level Labs: No results found for: LITHIUM No results found for: VALPROATE No components found for:  CBMZ  Current Medications: Current Outpatient Medications  Medication Sig Dispense Refill   escitalopram (LEXAPRO) 10 MG tablet Take 1 tablet (10 mg total) by mouth daily. 30 tablet 0   busPIRone (BUSPAR) 10 MG tablet TAKE 1 TABLET BY MOUTH TWICE DAILY 60 tablet 1   fexofenadine (ALLEGRA) 180 MG tablet Take 180 mg by mouth daily.     hydrOXYzine (ATARAX/VISTARIL) 25 MG tablet TAKE 1 TABLET BY MOUTH 3 TIMES DAILY AS NEEDED FOR ANXIETY. 90 tablet 1   QUEtiapine (SEROQUEL) 25 MG tablet TAKE 1 TABLET BY MOUTH AT BEDTIME AS NEEDED FOR SLEEP 30 tablet 2   No current facility-administered medications for this  visit.     Musculoskeletal: Strength & Muscle Tone: unable to assess since visit was over the telemedicine. Gait & Station: unable to assess since visit was  over the telemedicine. Patient leans: N/A  Psychiatric Specialty Exam: Review of Systems  There were no vitals taken for this visit.There is no height or weight on file to calculate BMI.  General Appearance: Casual and Fairly Groomed  Eye Contact:   avoidant  Speech:  Normal Rate and sometimes blocked  Volume:  Normal  Mood:   "angry"  Affect:  Appropriate, Congruent, and Constricted  Thought Process:  Linear, Goal directed  Orientation:  Full (Time, Place, and Person)  Thought Content: Obsessions   Suicidal Thoughts:  No  Homicidal Thoughts:  No  Memory:  Immediate;   Fair Recent;   Fair Remote;   Fair  Judgement:  Fair  Insight:  Shallow  Psychomotor Activity:  Normal  Concentration:  Concentration: Fair and Attention Span: Fair  Recall:  [employer] of Knowledge: Fair  Language: Fair      AIMS (if indicated): not done  Assets:  Desire for Improvement Financial Resources/Insurance Housing Physical Health Social Support  ADL's:  Intact  Cognition: WNL  Sleep:  Fair   Screenings: AIMS    Flowsheet Row Admission (Discharged) from 12/21/2020 in [facility]PATIENT BE[facility]AL MEDICINE  AIMS Total Score 0      AUDIT    Flowsheet Row Admission (Discharged) from 12/21/2020 in [facility] INPATIENT BEHAVIORAL MEDICINE  Alcohol Use Disorder Identification Test Final Score (AUDIT) 0      PHQ2-9    Flowsheet Row Office Visit from 07/22/2020 in [facility] Office Visit from 06/22/2019 in [facility]  PHQ-2 Total Score 0 0      Flowsheet Row Admission (Discharged) from 12/21/2020 in [facility]PATIENT BEHAVIORAL MEDICINE ED from 12/20/2020 in [facility]  C-SSRS RISK CATEGORY Error: Q3, 4, or 5 should not be populated when Q2 is No Error: Question 6  not populated        Assessment and Plan:   20 year old male with most likely dx of Generalized and Social Anxiety disorders, OCD, Depression, ADHD presented for post discharge follow up. He had hospitalization at [facility] for a week due to SI and OD for suicide attempt at the beginning of 2022. He subsequently attended ntensive outpatient treatment, attempted to attend [facility] but discharged self within a day, returned to clinic after almost 4 months for anxiety, depression, anger. He apparently has not been compliant to his medications except since last one week and continues to use MJA/Nicotine/Delta8 and 9. He had partial improvement on Lexapro and Buspar therefore recommending to continue with them for now with plan to adjust dose and augment. He does not see substance abuse is a problem for him. He has long hx of intermittent SI and appears to be chronic in nature. We discussed the plan as below.    Plan:     - Continue with Lexapro 10 mg daily with plan to increase back to 20 mg at the next appointment. - Continue with Buspar 10 mg BID.  - Recommended ind therapy, planning to start therapy at [facility].  - Continue Atarax 12.5-25 mg TID PRN for anxiety/panic attacks and QHS PRN for sleep  - Continue BuSpar  10 mg 2 times a day. - Continue with Seroquel 25 mg QHS PRN for sleep.      Follow-up in 3 weeks or earlier if needed.    This note was generated in part or whole with voice recognition software. Voice recognition is usually quite accurate but there are transcription errors  that can and very often do occur. I apologize for any typographical errors that were not detected and corrected.      Christian Smalling, MD 05/17/2021, 12:40 PM

## 2021-05-31 ENCOUNTER — Ambulatory Visit (HOSPITAL_COMMUNITY): Payer: 59 | Admitting: Licensed Clinical Social Worker

## 2021-06-07 ENCOUNTER — Ambulatory Visit (HOSPITAL_COMMUNITY): Payer: 59 | Admitting: Licensed Clinical Social Worker

## 2021-06-09 ENCOUNTER — Telehealth: Payer: 59 | Admitting: Child and Adolescent Psychiatry

## 2021-08-29 ENCOUNTER — Ambulatory Visit: Payer: 59 | Admitting: Family Medicine

## 2021-08-29 ENCOUNTER — Other Ambulatory Visit: Payer: Self-pay

## 2021-08-29 ENCOUNTER — Other Ambulatory Visit (HOSPITAL_COMMUNITY)
Admission: RE | Admit: 2021-08-29 | Discharge: 2021-08-29 | Disposition: A | Discharge status: Home / Self Care | Payer: 59 | Source: Ambulatory Visit | Attending: Family Medicine | Admitting: Family Medicine

## 2021-08-29 ENCOUNTER — Encounter: Payer: Self-pay | Admitting: Family Medicine

## 2021-08-29 VITALS — BP 100/70 | HR 89 | Temp 97.6°F | Ht 70.0 in | Wt 135.6 lb

## 2021-08-29 DIAGNOSIS — F330 Major depressive disorder, recurrent, mild: Secondary | ICD-10-CM

## 2021-08-29 DIAGNOSIS — J309 Allergic rhinitis, unspecified: Secondary | ICD-10-CM | POA: Diagnosis not present

## 2021-08-29 DIAGNOSIS — R300 Dysuria: Secondary | ICD-10-CM | POA: Diagnosis not present

## 2021-08-29 DIAGNOSIS — R21 Rash and other nonspecific skin eruption: Secondary | ICD-10-CM | POA: Diagnosis not present

## 2021-08-29 DIAGNOSIS — N489 Disorder of penis, unspecified: Secondary | ICD-10-CM | POA: Insufficient documentation

## 2021-08-29 LAB — POCT URINALYSIS DIPSTICK
Bilirubin, UA: NEGATIVE
Glucose, UA: NEGATIVE
Ketones, UA: NEGATIVE
Leukocytes, UA: NEGATIVE
Nitrite, UA: NEGATIVE
Protein, UA: NEGATIVE
Spec Grav, UA: 1.02 (ref 1.010–1.025)
Urobilinogen, UA: 0.2 E.U./dL
pH, UA: 6 (ref 5.0–8.0)

## 2021-08-29 LAB — URINALYSIS, MICROSCOPIC ONLY: WBC, UA: NONE SEEN (ref 0–?)

## 2021-08-29 MED ORDER — FLUTICASONE PROPIONATE 50 MCG/ACT NA SUSP
2.0000 | Freq: Every day | NASAL | 6 refills | Status: DC
Start: 2021-08-29 — End: 2021-10-06
  Filled 2021-08-29: qty 16, 30d supply, fill #0

## 2021-08-29 NOTE — Progress Notes (Addendum)
Marikay Alar, MD Phone: [phone]  Christian Bond is a 20 y.o. male who presents today for follow-up.  Allergic rhinitis: Patient notes sinus issues going on for couple of months.  Feels like his allergies.  Occasionally gets a headache with this.  He has sneezing and rhinorrhea.  Some postnasal drip.  No congestion or fevers.  No COVID test.  He is not on any medications currently though he notes in the past allergyhave been beneficial.  Penis lesion: Patient notes this has been present for several months.  It initially got bigger though now has been stable.  He notes no pain.  There is no drainage from it.  He has not tried any medications.  He has not been sexually active since noticing this.  No discharge from his penis.  He does note some very minimal dysuria at times while he is urinating.  No blood in his urine.  Anxiety/depression: Patient notes he feels good with regards to these things.  Notes they are not as bad as they have been in the past.  He is no longer on Lexapro, BuSpar, or Seroquel.  He has not been having any panic attacks.  No SI or HI.  Folliculitis: Patient reports onset of apparently millimeter sized rash to his legs last week.  His father who is a family practitioner prescribed him doxycycline and the patient notes these resolved with the antibiotic.  There was pus coming out of them if he popped them.  He notes no exposures with rash.  He has had no fevers or other systemic symptoms.  He has not traveled recently.   Social History   Tobacco Use  Smoking Status Never  Smokeless Tobacco Never    Current Outpatient Medications on File Prior to Visit  Medication Sig Dispense Refill   [DISCONTINUED] ARIPiprazole (ABILIFY) 15 MG tablet Take 1 tablet (15 mg total) by mouth daily. 30 tablet 1   [DISCONTINUED] buPROPion (WELLBUTRIN XL) 150 MG 24 hr tablet Take 1 tablet (150 mg total) by mouth daily. 30 tablet 0   [DISCONTINUED] sertraline (ZOLOFT) 50 MG tablet  Take 1 tablet (50 mg total) by mouth daily. 30 tablet 0   No current facility-administered medications on file prior to visit.     ROS see history of present illness  Objective  Physical Exam Vitals:   08/29/21 0931  BP: 100/70  Pulse: 89  Temp: 97.6 F (36.4 C)  SpO2: 99%    BP Readings from Last 3 Encounters:  08/29/21 100/70  12/20/20 120/63  09/02/20 90/60   Wt Readings from Last 3 Encounters:  08/29/21 135 lb 9.6 oz (61.5 kg)  12/05/20 131 lb (59.4 kg) (13 %, Z= -1.12)*  09/02/20 131 lb 9.6 oz (59.7 kg) (15 %, Z= -1.05)*   * Growth percentiles are based on CDC (Boys, 2-20 Years) data.    Physical Exam Constitutional:      General: He is not in acute distress.    Appearance: He is not diaphoretic.  Cardiovascular:     Rate and Rhythm: Normal rate and regular rhythm.     Heart sounds: Normal heart sounds.  Pulmonary:     Effort: Pulmonary effort is normal.     Breath sounds: Normal breath sounds.  Genitourinary:      Comments: He does appear to have an adhesion along the dorsal aspect of his penis where his circumcision area has adhesed to the proximal aspect of his glans, he reports this has been present for a  very long time  Skin:    General: Skin is warm and dry.     Comments: Slightly hyperpigmented scar type lesions on the dorsum of his left foot that appear to be about a millimeter in diameter  Neurological:     Mental Status: He is alert.     Assessment/Plan: Please see individual problem list.  Problem List Items Addressed This Visit     Allergic rhinitis    Chronic issue.  We will trial Flonase to see if this is beneficial.      Relevant Medications   fluticasone (FLONASE) 50 MCG/ACT nasal spray   Dysuria - Primary    Very mild or minimal symptoms.  No other symptoms concerning for an STD or UTI though we will obtain a urinalysis and urine cytology for gonorrhea, chlamydia, and trichomonas as a precaution.      Relevant Orders   Urine  cytology ancillary only([place]) (Completed)   POCT Urinalysis Dipstick (Completed)   Urine Culture (Completed)   Urine Microscopic (Completed)   Mild episode of recurrent major depressive disorder (HCC)    Seems to be fairly well controlled right now.  He is no longer on medication.  He states he feels good.  He will monitor at this time.      Penile lesion    I suspect this is a cyst.  It has not resolved or improved.  We will refer to urology for further evaluation.      Relevant Orders   Ambulatory referral to Urology   Rash    Likely related to folliculitis based on description and rapid improvement with doxycycline.  He will monitor for recurrence.       Return in about 3 months (around 11/29/2021) for allergic rhinitis.  This visit occurred during the SARS-CoV-2 public health emergency.  Safety protocols were in place, including screening questions prior to the visit, additional usage of staff PPE, and extensive cleaning of exam room while observing appropriate contact time as indicated for disinfecting solutions.    Marikay Alar, MD [facility]

## 2021-08-29 NOTE — Assessment & Plan Note (Signed)
Likely related to folliculitis based on description and rapid improvement with doxycycline.  He will monitor for recurrence.

## 2021-08-29 NOTE — Assessment & Plan Note (Signed)
Very mild or minimal symptoms.  No other symptoms concerning for an STD or UTI though we will obtain a urinalysis and urine cytology for gonorrhea, chlamydia, and trichomonas as a precaution.

## 2021-08-29 NOTE — Assessment & Plan Note (Signed)
Seems to be fairly well controlled right now.  He is no longer on medication.  He states he feels good.  He will monitor at this time.

## 2021-08-29 NOTE — Assessment & Plan Note (Signed)
I suspect this is a cyst.  It has not resolved or improved.  We will refer to urology for further evaluation.

## 2021-08-29 NOTE — Patient Instructions (Signed)
Nice to see you. We will check your urine today.  Please start on the flonase to see if that helps with your allergy symptoms.

## 2021-08-29 NOTE — Assessment & Plan Note (Signed)
Chronic issue.  We will trial Flonase to see if this is beneficial.

## 2021-08-30 LAB — URINE CULTURE
MICRO NUMBER:: 12458281
Result:: NO GROWTH
SPECIMEN QUALITY:: ADEQUATE

## 2021-08-30 LAB — URINE CYTOLOGY ANCILLARY ONLY
Chlamydia: NEGATIVE
Comment: NEGATIVE
Comment: NEGATIVE
Comment: NORMAL
Neisseria Gonorrhea: NEGATIVE
Trichomonas: NEGATIVE

## 2021-08-31 NOTE — Addendum Note (Signed)
Addended by: Glori Luis on: 08/31/2021 09:39 AM   Modules accepted: Orders

## 2021-09-05 NOTE — Progress Notes (Incomplete)
   09/05/21 2:20 PM   [place] Bond [birth year] [id number]  Referring provider:  Glori Luis, MD [address] [place],  [place] [address] No chief complaint on file.    HPI: Christian Bond is a 20 y.o.male who presents today for further evaluation of penile lesion.   Penile lesion was evaluated by PCP Dr. Birdie Sons who referred him to urology. Patient had reported penile lesion for several months. It initially got bigger though now has been stable.  He notes no pain.  There is no drainage from it.  He has not tried any medications.  He has not been sexually active since noticing this.  No discharge from his penis.  He does note some very minimal dysuria at times while he is urinating.  No blood in his urine.      PMH: Past Medical History:  Diagnosis Date   ADHD (attention deficit hyperactivity disorder)    Allergy     Surgical History: Past Surgical History:  Procedure Laterality Date   TONSILLECTOMY AND ADENOIDECTOMY      Home Medications:  Allergies as of 09/06/2021       Reactions   Amoxicillin Rash   Erythema multiforma   Penicillins Other (See Comments)   Blisters.        Medication List        Accurate as of September 05, 2021  2:20 PM. If you have any questions, ask your nurse or doctor.          fluticasone 50 MCG/ACT nasal spray Commonly known as: FLONASE Place 2 sprays into both nostrils daily.        Allergies:  Allergies  Allergen Reactions   Amoxicillin Rash    Erythema multiforma   Penicillins Other (See Comments)    Blisters.    Family History: Family History  Problem Relation Age of Onset   Colon polyps Mother    Stroke Maternal Uncle    Heart disease Maternal Grandfather    Hyperlipidemia Maternal Grandfather    Stroke Maternal Grandfather     Social History:  reports that he has never smoked. He has never used smokeless tobacco. He reports that he does not drink alcohol and does not use  drugs.   Physical Exam: There were no vitals taken for this visit.  Constitutional:  Alert and oriented, No acute distress. HEENT: [place] AT, moist mucus membranes.  Trachea midline, no masses. Cardiovascular: No clubbing, cyanosis, or edema. Respiratory: Normal respiratory effort, no increased work of breathing. Skin: No rashes, bruises or suspicious lesions. Neurologic: Grossly intact, no focal deficits, moving all 4 extremities. Psychiatric: Normal mood and affect.  Laboratory Data:  Lab Results  Component Value Date   CREATININE 0.84 12/20/2020    Lab Results  Component Value Date   HGBA1C 5.0 12/22/2020    Urinalysis   Pertinent Imaging:    Assessment & Plan:     No follow-ups on file.  I,Kailey Littlejohn,acting as a Neurosurgeon for Vanna Scotland, MD.,have documented all relevant documentation on the behalf of Vanna Scotland, MD,as directed by  Vanna Scotland, MD while in the presence of Vanna Scotland, MD.   [facility] [address] [place], [place] [address] [phone]

## 2021-09-06 ENCOUNTER — Ambulatory Visit: Payer: 59 | Admitting: Urology

## 2021-09-06 ENCOUNTER — Other Ambulatory Visit: Payer: Self-pay

## 2021-09-07 NOTE — Progress Notes (Deleted)
09/08/2021 4:06 PM   Christian Bond [birth year] [id number]  Referring provider: Glori Luis, MD [address] [place],  [place] [address]  No chief complaint on file.   HPI: 20 year old male who presents today for further evaluation of a penile lesion.  He was seen and evaluated by his primary care physician, Dr. Birdie Sons on 08/29/2021.  At that time, he mentioned that he had penile lesion for several months.  When he first noticed it, and large but is more recently stabilized.  He denies any penile discharge.  He was also having some minimal dysuria with urinating but no blood.  He had a negative urinalysis and negative GC chlamydia and trichomonas testing.   PMH: Past Medical History:  Diagnosis Date   ADHD (attention deficit hyperactivity disorder)    Allergy     Surgical History: Past Surgical History:  Procedure Laterality Date   TONSILLECTOMY AND ADENOIDECTOMY      Home Medications:  Allergies as of 09/08/2021       Reactions   Amoxicillin Rash   Erythema multiforma   Penicillins Other (See Comments)   Blisters.        Medication List        Accurate as of September 07, 2021  4:06 PM. If you have any questions, ask your nurse or doctor.          fluticasone 50 MCG/ACT nasal spray Commonly known as: FLONASE Place 2 sprays into both nostrils daily.        Allergies:  Allergies  Allergen Reactions   Amoxicillin Rash    Erythema multiforma   Penicillins Other (See Comments)    Blisters.    Family History: Family History  Problem Relation Age of Onset   Colon polyps Mother    Stroke Maternal Uncle    Heart disease Maternal Grandfather    Hyperlipidemia Maternal Grandfather    Stroke Maternal Grandfather     Social History:  reports that he has never smoked. He has never used smokeless tobacco. He reports that he does not drink alcohol and does not use drugs.   Physical Exam: There were no vitals taken for this  visit.  Constitutional:  Alert and oriented, No acute distress. HEENT: [place] AT, moist mucus membranes.  Trachea midline, no masses. Cardiovascular: No clubbing, cyanosis, or edema. Respiratory: Normal respiratory effort, no increased work of breathing. GI: Abdomen is soft, nontender, nondistended, no abdominal masses GU: No CVA tenderness Skin: No rashes, bruises or suspicious lesions. Neurologic: Grossly intact, no focal deficits, moving all 4 extremities. Psychiatric: Normal mood and affect.  Laboratory Data: Lab Results  Component Value Date   WBC 11.7 (H) 12/20/2020   HGB 14.7 12/20/2020   HCT 41.1 12/20/2020   MCV 90.5 12/20/2020   PLT 315 12/20/2020    Lab Results  Component Value Date   CREATININE 0.84 12/20/2020    No results found for: PSA  No results found for: TESTOSTERONE  Lab Results  Component Value Date   HGBA1C 5.0 12/22/2020    Urinalysis    Component Value Date/Time   BILIRUBINUR negative 08/29/2021 1139   PROTEINUR Negative 08/29/2021 1139   UROBILINOGEN 0.2 08/29/2021 1139   NITRITE negative 08/29/2021 1139   LEUKOCYTESUR Negative 08/29/2021 1139    No results found for: LABMICR, WBCUA, RBCUA, LABEPIT, MUCUS, BACTERIA  Pertinent Imaging: *** No results found for this or any previous visit.  No results found for this or any previous visit.  No results  found for this or any previous visit.  No results found for this or any previous visit.  No results found for this or any previous visit.  No results found for this or any previous visit.  No results found for this or any previous visit.  No results found for this or any previous visit.   Assessment & Plan:    There are no diagnoses linked to this encounter.  No follow-ups on file.  Vanna Scotland, MD  [facility] [address] [place], [place] [address] [phone]

## 2021-09-08 ENCOUNTER — Ambulatory Visit: Payer: 59 | Admitting: Urology

## 2021-09-29 ENCOUNTER — Emergency Department (EMERGENCY_DEPARTMENT_HOSPITAL)
Admission: EM | Admit: 2021-09-29 | Discharge: 2021-10-02 | Disposition: A | Discharge status: Home / Self Care | Payer: 59 | Source: Home / Self Care | Attending: Student in an Organized Health Care Education/Training Program | Admitting: Student in an Organized Health Care Education/Training Program

## 2021-09-29 ENCOUNTER — Encounter: Payer: Self-pay | Admitting: Emergency Medicine

## 2021-09-29 ENCOUNTER — Other Ambulatory Visit: Payer: Self-pay

## 2021-09-29 DIAGNOSIS — Z7951 Long term (current) use of inhaled steroids: Secondary | ICD-10-CM | POA: Insufficient documentation

## 2021-09-29 DIAGNOSIS — F1210 Cannabis abuse, uncomplicated: Secondary | ICD-10-CM | POA: Diagnosis not present

## 2021-09-29 DIAGNOSIS — F3163 Bipolar disorder, current episode mixed, severe, without psychotic features: Secondary | ICD-10-CM | POA: Diagnosis not present

## 2021-09-29 DIAGNOSIS — F331 Major depressive disorder, recurrent, moderate: Secondary | ICD-10-CM | POA: Diagnosis not present

## 2021-09-29 DIAGNOSIS — F419 Anxiety disorder, unspecified: Secondary | ICD-10-CM | POA: Diagnosis not present

## 2021-09-29 DIAGNOSIS — F32A Depression, unspecified: Secondary | ICD-10-CM

## 2021-09-29 DIAGNOSIS — F3164 Bipolar disorder, current episode mixed, severe, with psychotic features: Secondary | ICD-10-CM | POA: Diagnosis present

## 2021-09-29 DIAGNOSIS — F1914 Other psychoactive substance abuse with psychoactive substance-induced mood disorder: Secondary | ICD-10-CM | POA: Insufficient documentation

## 2021-09-29 DIAGNOSIS — Z20822 Contact with and (suspected) exposure to covid-19: Secondary | ICD-10-CM | POA: Insufficient documentation

## 2021-09-29 DIAGNOSIS — Z046 Encounter for general psychiatric examination, requested by authority: Secondary | ICD-10-CM | POA: Diagnosis present

## 2021-09-29 DIAGNOSIS — F1010 Alcohol abuse, uncomplicated: Secondary | ICD-10-CM | POA: Diagnosis not present

## 2021-09-29 DIAGNOSIS — F429 Obsessive-compulsive disorder, unspecified: Secondary | ICD-10-CM | POA: Diagnosis not present

## 2021-09-29 DIAGNOSIS — G4700 Insomnia, unspecified: Secondary | ICD-10-CM | POA: Diagnosis not present

## 2021-09-29 DIAGNOSIS — F902 Attention-deficit hyperactivity disorder, combined type: Secondary | ICD-10-CM | POA: Diagnosis not present

## 2021-09-29 DIAGNOSIS — R45851 Suicidal ideations: Secondary | ICD-10-CM | POA: Diagnosis not present

## 2021-09-29 DIAGNOSIS — F12959 Cannabis use, unspecified with psychotic disorder, unspecified: Secondary | ICD-10-CM | POA: Diagnosis not present

## 2021-09-29 LAB — RESP PANEL BY RT-PCR (FLU A&B, COVID) ARPGX2
Influenza A by PCR: NEGATIVE
Influenza B by PCR: NEGATIVE
SARS Coronavirus 2 by RT PCR: NEGATIVE

## 2021-09-29 LAB — CBC
HCT: 42.1 % (ref 39.0–52.0)
Hemoglobin: 14.4 g/dL (ref 13.0–17.0)
MCH: 31.6 pg (ref 26.0–34.0)
MCHC: 34.2 g/dL (ref 30.0–36.0)
MCV: 92.3 fL (ref 80.0–100.0)
Platelets: 346 10*3/uL (ref 150–400)
RBC: 4.56 MIL/uL (ref 4.22–5.81)
RDW: 12.3 % (ref 11.5–15.5)
WBC: 7.9 10*3/uL (ref 4.0–10.5)
nRBC: 0 % (ref 0.0–0.2)

## 2021-09-29 LAB — COMPREHENSIVE METABOLIC PANEL
ALT: 14 U/L (ref 0–44)
AST: 19 U/L (ref 15–41)
Albumin: 5.2 g/dL — ABNORMAL HIGH (ref 3.5–5.0)
Alkaline Phosphatase: 59 U/L (ref 38–126)
Anion gap: 8 (ref 5–15)
BUN: 10 mg/dL (ref 6–20)
CO2: 28 mmol/L (ref 22–32)
Calcium: 10 mg/dL (ref 8.9–10.3)
Chloride: 103 mmol/L (ref 98–111)
Creatinine, Ser: 0.93 mg/dL (ref 0.61–1.24)
GFR, Estimated: >60 mL/min (ref >60)
Glucose, Bld: 93 mg/dL (ref 70–99)
Potassium: 3.9 mmol/L (ref 3.5–5.1)
Sodium: 139 mmol/L (ref 135–145)
Total Bilirubin: 0.9 mg/dL (ref 0.3–1.2)
Total Protein: 8 g/dL (ref 6.5–8.1)

## 2021-09-29 LAB — URINE DRUG SCREEN, QUALITATIVE (ARMC ONLY)
Amphetamines, Ur Screen: NOT DETECTED
Barbiturates, Ur Screen: NOT DETECTED
Benzodiazepine, Ur Scrn: NOT DETECTED
Cannabinoid 50 Ng, Ur ~~LOC~~: POSITIVE — AB
Cocaine Metabolite,Ur ~~LOC~~: NOT DETECTED
MDMA (Ecstasy)Ur Screen: NOT DETECTED
Methadone Scn, Ur: NOT DETECTED
Opiate, Ur Screen: NOT DETECTED
Phencyclidine (PCP) Ur S: NOT DETECTED
Tricyclic, Ur Screen: NOT DETECTED

## 2021-09-29 LAB — ETHANOL: Alcohol, Ethyl (B): <10 mg/dL (ref <10)

## 2021-09-29 LAB — SALICYLATE LEVEL: Salicylate Lvl: <7 mg/dL — ABNORMAL LOW (ref 7.0–30.0)

## 2021-09-29 LAB — ACETAMINOPHEN LEVEL: Acetaminophen (Tylenol), Serum: <10 ug/mL — ABNORMAL LOW (ref 10–30)

## 2021-09-29 MED ORDER — HALOPERIDOL LACTATE 5 MG/ML IJ SOLN
5.0000 mg | Freq: Once | INTRAMUSCULAR | Status: AC
  Administered 2021-09-29: 5 mg via INTRAMUSCULAR
  Filled 2021-09-29: qty 1

## 2021-09-29 MED ORDER — LORAZEPAM 2 MG/ML IJ SOLN
2.0000 mg | Freq: Once | INTRAMUSCULAR | Status: AC
  Administered 2021-09-29: 2 mg via INTRAMUSCULAR
  Filled 2021-09-29: qty 1

## 2021-09-29 MED ORDER — DIPHENHYDRAMINE HCL 50 MG/ML IJ SOLN
50.0000 mg | Freq: Once | INTRAMUSCULAR | Status: AC
  Administered 2021-09-29: 50 mg via INTRAMUSCULAR
  Filled 2021-09-29: qty 1
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
## 2021-09-29 NOTE — ED Notes (Signed)
IVC moved to [facility] pending inpatient

## 2021-09-29 NOTE — ED Notes (Signed)
Black t shirt, navy blue pj pants, placed in belonging bag with pt label.
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
## 2021-09-29 NOTE — Consult Note (Signed)
[facility] Face-to-Face Psychiatry Consult   Reason for Consult:  violent behavior and depression Referring Physician:  dr. Roxan Hockey Patient Identification: Christian Bond MRN:  [id number] Principal Diagnosis: <principal problem not specified> Diagnosis:  Active Problems:   * No active hospital problems. *   Total Time spent with patient: 45 minutes  Subjective:   Christian Bond is a 20 y.o. male patient with psych history of depressive disorder with psychosis, OCD, cannabis use disorder, admitted with violent behavior and depression.  Patient seen,. Chart reviewed. UDS pos for THC. BAL < 10.  HPI:   Patient presents to the ER for evaluation of violent behavior reported severe depression under IVC paperwork taken out by patient's mother.  Reportedly was setting of violent aggressive text to her apparently kicked in the garage door and was threatening to kill his younger brother.   Patient reports feeling down and embarrassed with himself due to his behavior at home. He admits to recently-increased anger outbursts. Also reports depression, anxiety, occasional paranoid thoughts. He discloses using (smoking) cannabis daily multiple times a day. His last THC use was "this morning". He appears sedated and slightly-confused. He reports "I cannot think straight". He denies suicidal or homicidal thoughts. Denies hallucinations. Denies physical complaints.  He says he is not interested in restarting any medications while in ER.  Objectively, he appears confused and keeps saying "I cannot think straight" and "I don`t feel okay to go home today". He is likely still intoxicated with THC. Will keep in observation till tomorrow, then reassess re safety for discharge.    Past Psychiatric History:  Dx: h/o OCD since middle school, MDD for 5 years.  One suicide attempt via ingestion of 5 tablets of Lexapro.  One psych admission to [facility] in 11/2020. Past psych med trials: Lexapro, Buspar, Wellbutrin,  Abilify.  No current psych meds. No current outpatient psych.   Risk to Self:   Risk to Others:   Prior Inpatient Therapy:   Prior Outpatient Therapy:    Past Medical History:  Past Medical History:  Diagnosis Date   ADHD (attention deficit hyperactivity disorder)    Allergy     Past Surgical History:  Procedure Laterality Date   TONSILLECTOMY AND ADENOIDECTOMY     Family History:  Family History  Problem Relation Age of Onset   Colon polyps Mother    Stroke Maternal Uncle    Heart disease Maternal Grandfather    Hyperlipidemia Maternal Grandfather    Stroke Maternal Grandfather    Family Psychiatric  History: unknown Social History:  Social History   Substance and Sexual Activity  Alcohol Use No     Social History   Substance and Sexual Activity  Drug Use No    Social History   Socioeconomic History   Marital status: Single    Spouse name: Not on file   Number of children: Not on file   Years of education: Not on file   Highest education level: Not on file  Occupational History   Not on file  Tobacco Use   Smoking status: Never   Smokeless tobacco: Never  Vaping Use   Vaping Use: Some days  Substance and Sexual Activity   Alcohol use: No   Drug use: No   Sexual activity: Not on file  Other Topics Concern   Not on file  Social History Narrative   Not on file   Social Determinants of Health   Financial Resource Strain: Not on file  Food Insecurity: Not  on file  Transportation Needs: Not on file  Physical Activity: Not on file  Stress: Not on file  Social Connections: Not on file   Additional Social History:    Allergies:   Allergies  Allergen Reactions   Amoxicillin Rash    Erythema multiforma   Penicillins Other (See Comments)    Blisters.    Labs:  Results for orders placed or performed during the hospital encounter of 09/29/21 (from the past 48 hour(s))  Comprehensive metabolic panel     Status: Abnormal   Collection Time:  09/29/21 12:03 PM  Result Value Ref Range   Sodium 139 135 - 145 mmol/L   Potassium 3.9 3.5 - 5.1 mmol/L   Chloride 103 98 - 111 mmol/L   CO2 28 22 - 32 mmol/L   Glucose, Bld 93 70 - 99 mg/dL    Comment: Glucose reference range applies only to samples taken after fasting for at least 8 hours.   BUN 10 6 - 20 mg/dL   Creatinine, Ser 6.25 0.61 - 1.24 mg/dL   Calcium 63.8 8.9 - 93.7 mg/dL   Total Protein 8.0 6.5 - 8.1 g/dL   Albumin 5.2 (H) 3.5 - 5.0 g/dL   AST 19 15 - 41 U/L   ALT 14 0 - 44 U/L   Alkaline Phosphatase 59 38 - 126 U/L   Total Bilirubin 0.9 0.3 - 1.2 mg/dL   GFR, Estimated >34 >28 mL/min    Comment: (NOTE) Calculated using the CKD-EPI Creatinine Equation (2021)    Anion gap 8 5 - 15    Comment: Performed at [facility], [address], [place], [place] [address]  Ethanol     Status: None   Collection Time: 09/29/21 12:03 PM  Result Value Ref Range   Alcohol, Ethyl (B) <10 <10 mg/dL    Comment: (NOTE) Lowest detectable limit for serum alcohol is 10 mg/dL.  For medical purposes only. Performed at [facility], [address] [place], [place] [address]   Salicylate level     Status: Abnormal   Collection Time: 09/29/21 12:03 PM  Result Value Ref Range   Salicylate Lvl <7.0 (L) 7.0 - 30.0 mg/dL    Comment: Performed at [facility], [address] [place], [place] [address]  Acetaminophen level     Status: Abnormal   Collection Time: 09/29/21 12:03 PM  Result Value Ref Range   Acetaminophen (Tylenol), Serum <10 (L) 10 - 30 ug/mL    Comment: (NOTE) Therapeutic concentrations vary significantly. A range of 10-30 ug/mL  may be an effective concentration for many patients. However, some  are best treated at concentrations outside of this range. Acetaminophen concentrations >150 ug/mL at 4 hours after ingestion  and >50 ug/mL at 12 hours after ingestion are often associated with  toxic reactions.  Performed at [facility], [address] [place], [place] [address]   cbc     Status: None   Collection Time: 09/29/21 12:03 PM  Result Value Ref Range   WBC 7.9 4.0 - 10.5 K/uL   RBC 4.56 4.22 - 5.81 MIL/uL   Hemoglobin 14.4 13.0 - 17.0 g/dL   HCT 84.5 36.4 - 68.0 %   MCV 92.3 80.0 - 100.0 fL   MCH 31.6 26.0 - 34.0 pg   MCHC 34.2 30.0 - 36.0 g/dL   RDW 32.1 22.4 - 82.5 %   Platelets 346 150 - 400 K/uL   nRBC 0.0 0.0 - 0.2 %  Comment: Performed at [facility], [address] [place], [place] [address]  Urine Drug Screen, Qualitative     Status: Abnormal   Collection Time: 09/29/21 12:03 PM  Result Value Ref Range   Tricyclic, Ur Screen NONE DETECTED NONE DETECTED   Amphetamines, Ur Screen NONE DETECTED NONE DETECTED   MDMA (Ecstasy)Ur Screen NONE DETECTED NONE DETECTED   Cocaine Metabolite,Ur [place] NONE DETECTED NONE DETECTED   Opiate, Ur Screen NONE DETECTED NONE DETECTED   Phencyclidine (PCP) Ur S NONE DETECTED NONE DETECTED   Cannabinoid 50 Ng, Ur [place] POSITIVE (A) NONE DETECTED   Barbiturates, Ur Screen NONE DETECTED NONE DETECTED   Benzodiazepine, Ur Scrn NONE DETECTED NONE DETECTED   Methadone Scn, Ur NONE DETECTED NONE DETECTED    Comment: (NOTE) Tricyclics + metabolites, urine    Cutoff 1000 ng/mL Amphetamines + metabolites, urine  Cutoff 1000 ng/mL MDMA (Ecstasy), urine              Cutoff 500 ng/mL Cocaine Metabolite, urine          Cutoff 300 ng/mL Opiate + metabolites, urine        Cutoff 300 ng/mL Phencyclidine (PCP), urine         Cutoff 25 ng/mL Cannabinoid, urine                 Cutoff 50 ng/mL Barbiturates + metabolites, urine  Cutoff 200 ng/mL Benzodiazepine, urine              Cutoff 200 ng/mL Methadone, urine                   Cutoff 300 ng/mL  The urine drug screen provides only a preliminary, unconfirmed analytical test result and should not be used for non-medical purposes. Clinical consideration and professional judgment should be applied to any positive  drug screen result due to possible interfering substances. A more specific alternate chemical method must be used in order to obtain a confirmed analytical result. Gas chromatography / mass spectrometry (GC/MS) is the preferred confirm atory method. Performed at [facility], [address] [place], [place] [address]     No current facility-administered medications for this encounter.   Current Outpatient Medications  Medication Sig Dispense Refill   fluticasone (FLONASE) 50 MCG/ACT nasal spray Place 2 sprays into both nostrils daily. 16 g 6    Musculoskeletal: Strength & Muscle Tone: within normal limits Gait & Station: normal Patient leans: N/A    Psychiatric Specialty Exam: MENTAL STATUS EXAM:  Appearance: young CM, appearing stated age,  wearing hospital clothes, with fair grooming and hygiene. Decreased level of alertness and confused facial expression.  Attitude/Behavior: calm, cooperative.  Motor: WNL; dyskinesias not evident. Gait appears in full range.  Speech: slow, spontaneous, clear, coherent,.  Mood: dysthymic.  Affect: blunted  Thought process: patient appears coherent, not clearly-organized.  Thought content: patient denies suicidal thoughts, denies homicidal thoughts; did not express any delusions.  Thought perception: patient denies auditory and visual hallucinations. Did not appear internally stimulated.  Cognition: patient is alert and oriented in self, place, date.  Insight: limited, in regards of understanding of presence, nature, cause, and significance of mental or emotional problem.  Judgement: poor, in regards of ability to make good decisions concerning the appropriate thing to do in various situations, including ability to form opinions regarding their mental health condition.    Physical Exam: Physical Exam ROS Blood pressure 110/82, pulse 94, temperature 98.3 F (36.8 C), temperature source Oral,  resp. rate 16, height 5'  10" (1.778 m), weight 54.4 kg, SpO2 100 %. Body mass index is 17.22 kg/m.  Treatment Plan Summary: Daily contact with patient to assess and evaluate symptoms and progress in treatment and Medication management  20yo M patient with severe depression, substance use, presents with worsened violent behavior and depression in settings of daily THC use and being off psych medications.  During my assessment, patient appears sedated and somewhat confused. Patient denies suicidal, homicidal thoughts,  hallucinations and paranoia; although he reports feeling confused and states he does not feel safe for discharge today. His last substance use was several hours ago; he is likely still intoxicated with THC. Risk factors for suicide and violence include prior suicidal attempt, age, male gender, presence of mental disorder, substance use, lack of outpatient mental health care currently. Will keep in observation till tomorrow, then reassess re safety for discharge.   Disposition:  observation in ER and reassessment by Psych Consult service.  Thalia Party, MD 09/29/2021 2:58 PM

## 2021-09-29 NOTE — ED Notes (Signed)
Dr. Robinson at the bedside for pt evaluation  

## 2021-09-29 NOTE — ED Provider Notes (Signed)
[facility] Emergency Department Provider Note    Event Date/Time   First MD Initiated Contact with Patient 09/29/21 1244     (approximate)  I have reviewed the triage vital signs and the nursing notes.   HISTORY  Chief Complaint Psychiatric Evaluation    HPI Christian Bond is a 20 y.o. male with below listed past medical history presents to the ER for evaluation of violent behavior reported severe depression under IVC paperwork taken out by patient's mother.  Reportedly was setting of violent aggressive text to her apparently kicked in the garage door and was threatening to kill his younger brother.  Patient denies any SI or HI but does appear easily distractible.  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 Authorizing Provider  fluticasone (FLONASE) 50 MCG/ACT nasal spray  Place 2 sprays into both nostrils daily. 08/29/21   Glori Luis, MD  ARIPiprazole (ABILIFY) 15 MG tablet Take 1 tablet (15 mg total) by mouth daily. 12/27/20 01/03/21  Jesse Sans, MD  buPROPion (WELLBUTRIN XL) 150 MG 24 hr tablet Take 1 tablet (150 mg total) by mouth daily. 11/01/20 12/27/20  [name], MD  sertraline (ZOLOFT) 50 MG tablet Take 1 tablet (50 mg total) by mouth daily. 09/09/20 09/12/20  [name], MD    Allergies Amoxicillin and Penicillins    Social History Social History   Tobacco Use   Smoking status: Never   Smokeless tobacco: Never  Vaping Use   Vaping Use: Some days  Substance Use Topics   Alcohol use: No   Drug use: No    Review of Systems Patient denies headaches, rhinorrhea, blurry vision, numbness, shortness of breath, chest pain, edema, cough, abdominal pain, nausea, vomiting, diarrhea, dysuria, fevers, rashes or hallucinations unless otherwise stated above in HPI. ____________________________________________   PHYSICAL EXAM:  VITAL SIGNS: Vitals:   09/29/21 1157 09/29/21 1159  BP: 110/82 110/82  Pulse: 93 94  Resp: 18 16  Temp:  98.3 F (36.8 C)  SpO2:  100%    Constitutional: Alert and oriented.  Eyes: Conjunctivae are normal.  Head: Atraumatic. Nose: No congestion/rhinnorhea. Mouth/Throat: Mucous membranes are moist.   Neck: No stridor. Painless ROM.  Cardiovascular: Normal rate, regular rhythm. Grossly normal heart sounds.  Good peripheral circulation. Respiratory: Normal respiratory effort.  No retractions. Lungs CTAB. Gastrointestinal: Soft and nontender. No distention. No abdominal bruits. No  CVA tenderness. Genitourinary:  Musculoskeletal: No lower extremity tenderness nor edema.  No joint effusions. Neurologic:  Normal speech and language. No gross focal neurologic deficits are appreciated. No facial droop Skin:  Skin is warm, dry and intact. No rash noted. Psychiatric: Mood and affect are normal. Speech and  behavior are normal.  ____________________________________________   LABS (all labs ordered are listed, but only abnormal results are displayed)  Results for orders placed or performed during the hospital encounter of 09/29/21 (from the past 24 hour(s))  Comprehensive metabolic panel     Status: Abnormal   Collection Time: 09/29/21 12:03 PM  Result Value Ref Range   Sodium 139 135 - 145 mmol/L   Potassium 3.9 3.5 - 5.1 mmol/L   Chloride 103 98 - 111 mmol/L   CO2 28 22 - 32 mmol/L   Glucose, Bld 93 70 - 99 mg/dL   BUN 10 6 - 20 mg/dL   Creatinine, Ser 1.32 0.61 - 1.24 mg/dL   Calcium 44.0 8.9 - 10.2 mg/dL   Total Protein 8.0 6.5 - 8.1 g/dL   Albumin 5.2 (H) 3.5 - 5.0 g/dL   AST 19 15 - 41 U/L   ALT 14 0 - 44 U/L   Alkaline Phosphatase 59 38 - 126 U/L   Total Bilirubin 0.9 0.3 - 1.2 mg/dL   GFR, Estimated >72 >53 mL/min   Anion gap 8 5 - 15  Ethanol     Status: None   Collection Time: 09/29/21 12:03 PM  Result Value Ref Range   Alcohol, Ethyl (B) <10 <10 mg/dL  Salicylate level     Status: Abnormal   Collection Time: 09/29/21 12:03 PM  Result Value Ref Range   Salicylate Lvl <7.0 (L) 7.0 - 30.0 mg/dL  Acetaminophen level     Status: Abnormal   Collection Time: 09/29/21 12:03 PM  Result Value Ref Range   Acetaminophen (Tylenol), Serum <10 (L) 10 - 30 ug/mL  cbc     Status: None   Collection Time: 09/29/21 12:03 PM  Result Value Ref Range   WBC 7.9 4.0 - 10.5 K/uL   RBC 4.56 4.22 - 5.81 MIL/uL   Hemoglobin 14.4 13.0 - 17.0 g/dL   HCT 66.4 40.3 - 47.4 %   MCV 92.3 80.0 - 100.0 fL   MCH 31.6 26.0 - 34.0 pg   MCHC 34.2 30.0 - 36.0 g/dL   RDW 25.9 56.3 - 87.5 %   Platelets 346 150 - 400 K/uL   nRBC 0.0 0.0 - 0.2 %   ____________________________________________ ____________________________________________  RADIOLOGY   ____________________________________________   PROCEDURES  Procedure(s) performed:  Procedures    Critical Care performed:  no ____________________________________________   INITIAL IMPRESSION / ASSESSMENT AND PLAN / ED COURSE  Pertinent labs & imaging results that were available during my care of the patient were reviewed by me and considered in my medical decision making (see chart for details).   DDX: Psychosis, delirium, medication effect, noncompliance, polysubstance abuse, Si, Hi, depression   Christian Bond is a 20 y.o. who presents to the ED with for evaluation of violent behavior.  Patient has psych history of depression and substance abuse.  Laboratory testing was ordered to evaluation for underlying electrolyte derangement or signs of underlying organic pathology to explain today's presentation.  Based on history and physical and laboratory evaluation, it appears that the patient's presentation is 2/2 underlying psychiatric disorder and will require further evaluation and management by inpatient psychiatry.  Patient was  made  an IVC due to violent outbursts and substance abuse.  Disposition pending psychiatric evaluation.      The patient was evaluated in Emergency Department today for the symptoms described in the history of present illness. He/she was evaluated in the context of the global COVID-19 pandemic, which necessitated consideration that the patient might be at risk for infection with the SARS-CoV-2 virus that causes COVID-19. Institutional protocols and algorithms that pertain to the evaluation of patients at risk for COVID-19 are in a state of rapid change based on information released by regulatory bodies including the CDC and federal and state organizations. These policies and algorithms were followed during the patient's care in the ED.  As part of my medical decision making, I reviewed the following data within the electronic MEDICAL RECORD NUMBER Nursing notes reviewed and incorporated, Labs reviewed, notes from prior ED visits and [place] Controlled Substance  Database   ____________________________________________   FINAL CLINICAL IMPRESSION(S) / ED DIAGNOSES  Final diagnoses:  Depression, unspecified depression type      NEW MEDICATIONS STARTED DURING THIS VISIT:  New Prescriptions   No medications on file     Note:  This document was prepared using Dragon voice recognition software and may include unintentional dictation errors.    Willy Eddy, MD 09/29/21 1252

## 2021-09-29 NOTE — ED Notes (Signed)
Pt laying in bed.  

## 2021-09-29 NOTE — ED Provider Notes (Signed)
Shortly after the patient was moved to the [facility] he became quite agitated. Hitting walls/banging on doors. Given level of agitation and concern that he might injure himself, others or staff, the decision was made to give him some medication to help calm him down. This did require a brief manual hold to administer the medication.   Behavioral Restraint Provider Note:  Behavioral Indicators: Danger to self, Danger to others, and Violent behavior  Reaction to intervention: resisting  Review of systems: No changes  History: H and P reviewed  Mental Status Exam: Agitated  Restraint Continuation: Terminated after medication administration     Christian Semen, MD 09/29/21 1919

## 2021-09-29 NOTE — ED Notes (Signed)
IVC  CONSULT  DONE   

## 2021-09-29 NOTE — ED Notes (Addendum)
Behavioral Med at the bedside for pt evaluation

## 2021-09-29 NOTE — ED Notes (Signed)
Hourly rounding reveals patient in room. No complaints, stable, in no acute distress. Q15 minute rounds and monitoring via Security Cameras to continue. 

## 2021-09-29 NOTE — ED Notes (Signed)
Report to include Situation, Background, Assessment, and Recommendations received from Amy RN. Patient alert and oriented, warm and dry, in no acute distress. Patient denies SI, HI, AVH and pain. Patient made aware of Q15 minute rounds and security cameras for their safety. Patient instructed to come to me with needs or concerns.  

## 2021-09-29 NOTE — ED Notes (Addendum)
Pt refusing to go into his room in the BHU.  Pt stating this is a mistake and all staff will lose their jobs.  Pt demanding to leave and banging on windows and doors. Pt running around the unit trying to find a way out. Pt cursing at staff. EDP and charge nurse made aware.

## 2021-09-29 NOTE — ED Notes (Signed)
IVC called magistrate to change birthdate of patient to what we have in our system  Patient verified birthday to myself.  Changed on all papers

## 2021-09-29 NOTE — ED Notes (Signed)
Meal tray provided for pt.

## 2021-09-29 NOTE — ED Notes (Signed)
Pt given IM injections with charge nurse assisting.  Security officers had to briefly hold pt for medication administration and then was immediately released. Pt ambulating. NAD.

## 2021-09-30 DIAGNOSIS — F1210 Cannabis abuse, uncomplicated: Secondary | ICD-10-CM | POA: Diagnosis not present

## 2021-09-30 DIAGNOSIS — F3164 Bipolar disorder, current episode mixed, severe, with psychotic features: Secondary | ICD-10-CM | POA: Diagnosis not present

## 2021-09-30 DIAGNOSIS — Z7951 Long term (current) use of inhaled steroids: Secondary | ICD-10-CM | POA: Diagnosis not present

## 2021-09-30 DIAGNOSIS — Z20822 Contact with and (suspected) exposure to covid-19: Secondary | ICD-10-CM | POA: Diagnosis not present

## 2021-09-30 DIAGNOSIS — F1914 Other psychoactive substance abuse with psychoactive substance-induced mood disorder: Secondary | ICD-10-CM | POA: Diagnosis not present

## 2021-09-30 MED ORDER — DIVALPROEX SODIUM 500 MG PO DR TAB
500.0000 mg | DELAYED_RELEASE_TABLET | Freq: Two times a day (BID) | ORAL | Status: DC
  Administered 2021-09-30 – 2021-10-02 (×5): 500 mg via ORAL
  Filled 2021-09-30 (×5): qty 1

## 2021-09-30 MED ORDER — CHOLECALCIFEROL 10 MCG (400 UNIT) PO TABS
400.0000 [IU] | ORAL_TABLET | Freq: Every day | ORAL | Status: DC
  Filled 2021-09-30 (×2): qty 1

## 2021-09-30 MED ORDER — LORAZEPAM 1 MG PO TABS
1.0000 mg | ORAL_TABLET | Freq: Once | ORAL | Status: AC
  Administered 2021-09-30: 1 mg via ORAL
  Filled 2021-09-30: qty 1

## 2021-09-30 MED ORDER — CHOLECALCIFEROL 10 MCG/ML (400 UNIT/ML) PO LIQD
400.0000 [IU] | Freq: Every day | ORAL | Status: DC
  Administered 2021-09-30: 400 [IU] via ORAL
  Filled 2021-09-30: qty 1

## 2021-09-30 MED ORDER — QUETIAPINE FUMARATE 25 MG PO TABS
50.0000 mg | ORAL_TABLET | Freq: Every day | ORAL | Status: DC
  Administered 2021-09-30 – 2021-10-01 (×2): 50 mg via ORAL
  Filled 2021-09-30 (×2): qty 2

## 2021-09-30 MED ORDER — GABAPENTIN 100 MG PO CAPS
100.0000 mg | ORAL_CAPSULE | Freq: Three times a day (TID) | ORAL | Status: DC
  Administered 2021-09-30 – 2021-10-02 (×7): 100 mg via ORAL
  Filled 2021-09-30 (×8): qty 1

## 2021-09-30 MED ORDER — NICOTINE POLACRILEX 2 MG MT GUM
2.0000 mg | CHEWING_GUM | OROMUCOSAL | Status: DC | PRN
  Administered 2021-09-30 – 2021-10-02 (×7): 2 mg via ORAL
  Filled 2021-09-30 (×7): qty 1

## 2021-09-30 NOTE — ED Notes (Signed)
VS not taken, patient asleep 

## 2021-09-30 NOTE — ED Notes (Signed)
Psych NP at bedside with officer

## 2021-09-30 NOTE — ED Notes (Signed)
Hourly rounding reveals patient in room. No complaints, stable, in no acute distress. Q15 minute rounds and monitoring via Security Cameras to continue. 

## 2021-09-30 NOTE — ED Notes (Signed)
Pt given phone 

## 2021-09-30 NOTE — ED Notes (Signed)
Pt sitting calmly on bed. Vitals taken at will with cooperation.  Pt received breakfast tray and drink.

## 2021-09-30 NOTE — ED Notes (Signed)
Mom concerned about anxiety levels while pt was on the phone with her. Verbal orders for ativan PO given and pt willing to try it.
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
## 2021-09-30 NOTE — ED Provider Notes (Signed)
Emergency Medicine Observation Re-evaluation Note  Christian Bond is a 20 y.o. male, seen on rounds today.  Pt initially presented to the ED for complaints of Psychiatric Evaluation   Physical Exam  BP 113/74 (BP Location: Right Arm)   Pulse 65   Temp 98.8 F (37.1 C) (Oral)   Resp 19   Ht 5\' 10"  (1.778 m)   Wt 54.4 kg   SpO2 100%   BMI 17.22 kg/m  Physical Exam General: no acute distress Psych: calm  ED Course / MDM  EKG:   I have reviewed the labs performed to date as well as medications administered while in observation.  Recent changes in the last 24 hours include none.  Plan  Current plan is for inpatient. is under involuntary commitment.      Christian Hurt, MD 09/30/21 1450

## 2021-09-30 NOTE — ED Notes (Signed)
Pt took shower and brushed teeth. Given extra blanket.

## 2021-09-30 NOTE — ED Notes (Signed)
Pt agreeable to take new medications. Explained that we are trying to get patient downstairs but no word yet. Pt calm, cooperative, willing to try meds.

## 2021-09-30 NOTE — ED Notes (Signed)
Pt given coloring pages and books to look through

## 2021-09-30 NOTE — ED Notes (Signed)
Pt did not want gabapentin at this time. Told to let this RN know if he changes his mind. NP OK if he doesn't take it.

## 2021-10-01 DIAGNOSIS — F3164 Bipolar disorder, current episode mixed, severe, with psychotic features: Secondary | ICD-10-CM | POA: Diagnosis not present

## 2021-10-01 DIAGNOSIS — F1914 Other psychoactive substance abuse with psychoactive substance-induced mood disorder: Secondary | ICD-10-CM | POA: Diagnosis not present

## 2021-10-01 DIAGNOSIS — Z20822 Contact with and (suspected) exposure to covid-19: Secondary | ICD-10-CM | POA: Diagnosis not present

## 2021-10-01 DIAGNOSIS — F1210 Cannabis abuse, uncomplicated: Secondary | ICD-10-CM | POA: Diagnosis not present

## 2021-10-01 DIAGNOSIS — Z7951 Long term (current) use of inhaled steroids: Secondary | ICD-10-CM | POA: Diagnosis not present

## 2021-10-01 MED ORDER — VITAMIN D 25 MCG (1000 UNIT) PO TABS
500.0000 [IU] | ORAL_TABLET | Freq: Every day | ORAL | Status: DC
  Administered 2021-10-02: 500 [IU] via ORAL
  Filled 2021-10-01: qty 1

## 2021-10-01 NOTE — ED Notes (Signed)
Report to include Situation, Background, Assessment, and Recommendations received from Sonjia RN. Patient alert and oriented, warm and dry, in no acute distress. Patient denies SI, HI, AVH and pain. Patient made aware of Q15 minute rounds and security cameras for their safety. Patient instructed to come to me with needs or concerns.  

## 2021-10-01 NOTE — ED Notes (Signed)
Pt. Got dinner tray with a drink.

## 2021-10-01 NOTE — ED Notes (Signed)
Report given to Hewan RN 

## 2021-10-01 NOTE — ED Notes (Signed)
Pt. Returned all of shower supplies, pt. Is done showering.

## 2021-10-01 NOTE — ED Notes (Signed)
Hourly rounding reveals patient in room. No complaints, stable, in no acute distress. Q15 minute rounds and monitoring via Security Cameras to continue. 

## 2021-10-01 NOTE — ED Provider Notes (Signed)
Emergency Medicine Observation Re-evaluation Note  Christian Bond is a 20 y.o. male, seen on rounds today.  Pt initially presented to the ED for complaints of Psychiatric Evaluation   Physical Exam  BP 92/65 (BP Location: Right Arm)   Pulse 61   Temp 99.4 F (37.4 C) (Oral)   Resp 18   Ht 5\' 10"  (1.778 m)   Wt 54.4 kg   SpO2 98%   BMI 17.22 kg/m  Physical Exam General: standing in his room, no acute distress  Lungs: resps are unlabored Psych: calm, no agitation   ED Course / MDM  EKG:   I have reviewed the labs performed to date as well as medications administered while in observation.  Recent changes in the last 24 hours include no change  Plan  Current plan is for psych admission Christian Bond is under involuntary commitment.      [name], MD 10/01/21 1719

## 2021-10-01 NOTE — ED Notes (Signed)
Father left a new book with the patient which was allowed per NP.

## 2021-10-01 NOTE — ED Notes (Signed)
Pt. Alert and oriented, warm and dry, in no distress. Pt. Denies SI, HI, and AVH. Patient states PRN medication that was given earlier seems to have helped a lot. Pt. Encouraged to let nursing staff know of any concerns or needs.   ENVIRONMENTAL ASSESSMENT Potentially harmful objects out of patient reach: Yes.   Personal belongings secured: Yes.   Patient dressed in hospital provided attire only: Yes.   Plastic bags out of patient reach: Yes.   Patient care equipment (cords, cables, call bells, lines, and drains) shortened, removed, or accounted for: Yes.   Equipment and supplies removed from bottom of stretcher: Yes.   Potentially toxic materials out of patient reach: Yes.   Sharps container removed or out of patient reach: Yes.

## 2021-10-01 NOTE — ED Notes (Signed)
Pt. Was given shower supplies, pt. Is currently showering. °

## 2021-10-01 NOTE — ED Notes (Signed)
Pt. Father brought bible from home, this tech scrammed through bible for safety check, bible is in pt. Room.

## 2021-10-01 NOTE — ED Notes (Signed)
IVC pending placement 

## 2021-10-01 NOTE — ED Notes (Signed)
ED Provider at bedside. 

## 2021-10-01 NOTE — ED Notes (Signed)
Patient's father is with patient.

## 2021-10-01 NOTE — ED Notes (Signed)
Meal tray given. Patient is interacting through door with patient in locked area. Everyone is calm and cooperative at this time.

## 2021-10-01 NOTE — ED Notes (Signed)
Pt received snack and drink 

## 2021-10-01 NOTE — ED Notes (Signed)
Pt. Got breakfast tray and this tech was able to collect AM vitals.  

## 2021-10-02 ENCOUNTER — Encounter: Payer: Self-pay | Admitting: Psychiatry

## 2021-10-02 ENCOUNTER — Other Ambulatory Visit: Payer: Self-pay

## 2021-10-02 ENCOUNTER — Inpatient Hospital Stay
Admission: AD | Admit: 2021-10-02 | Discharge: 2021-10-07 | DRG: 885 | Disposition: A | Discharge status: Home / Self Care | Payer: 59 | Source: Intra-hospital | Attending: Behavioral Health | Admitting: Behavioral Health

## 2021-10-02 DIAGNOSIS — Z818 Family history of other mental and behavioral disorders: Secondary | ICD-10-CM | POA: Diagnosis not present

## 2021-10-02 DIAGNOSIS — F3164 Bipolar disorder, current episode mixed, severe, with psychotic features: Secondary | ICD-10-CM | POA: Diagnosis present

## 2021-10-02 DIAGNOSIS — E559 Vitamin D deficiency, unspecified: Secondary | ICD-10-CM | POA: Diagnosis present

## 2021-10-02 DIAGNOSIS — F1010 Alcohol abuse, uncomplicated: Secondary | ICD-10-CM | POA: Diagnosis present

## 2021-10-02 DIAGNOSIS — J309 Allergic rhinitis, unspecified: Secondary | ICD-10-CM | POA: Diagnosis present

## 2021-10-02 DIAGNOSIS — Z79899 Other long term (current) drug therapy: Secondary | ICD-10-CM

## 2021-10-02 DIAGNOSIS — R45851 Suicidal ideations: Secondary | ICD-10-CM | POA: Diagnosis present

## 2021-10-02 DIAGNOSIS — F429 Obsessive-compulsive disorder, unspecified: Secondary | ICD-10-CM | POA: Diagnosis present

## 2021-10-02 DIAGNOSIS — F3163 Bipolar disorder, current episode mixed, severe, without psychotic features: Secondary | ICD-10-CM | POA: Diagnosis not present

## 2021-10-02 DIAGNOSIS — F902 Attention-deficit hyperactivity disorder, combined type: Secondary | ICD-10-CM | POA: Diagnosis present

## 2021-10-02 DIAGNOSIS — Z20822 Contact with and (suspected) exposure to covid-19: Secondary | ICD-10-CM | POA: Diagnosis present

## 2021-10-02 DIAGNOSIS — F419 Anxiety disorder, unspecified: Secondary | ICD-10-CM | POA: Diagnosis present

## 2021-10-02 DIAGNOSIS — F1210 Cannabis abuse, uncomplicated: Secondary | ICD-10-CM | POA: Diagnosis not present

## 2021-10-02 DIAGNOSIS — F1914 Other psychoactive substance abuse with psychoactive substance-induced mood disorder: Secondary | ICD-10-CM | POA: Diagnosis not present

## 2021-10-02 DIAGNOSIS — Z881 Allergy status to other antibiotic agents status: Secondary | ICD-10-CM

## 2021-10-02 DIAGNOSIS — Z9151 Personal history of suicidal behavior: Secondary | ICD-10-CM | POA: Diagnosis not present

## 2021-10-02 DIAGNOSIS — F12959 Cannabis use, unspecified with psychotic disorder, unspecified: Secondary | ICD-10-CM | POA: Diagnosis present

## 2021-10-02 DIAGNOSIS — Z880 Allergy status to penicillin: Secondary | ICD-10-CM

## 2021-10-02 DIAGNOSIS — Z7951 Long term (current) use of inhaled steroids: Secondary | ICD-10-CM | POA: Diagnosis not present

## 2021-10-02 DIAGNOSIS — G4700 Insomnia, unspecified: Secondary | ICD-10-CM | POA: Diagnosis present

## 2021-10-02 LAB — RESP PANEL BY RT-PCR (FLU A&B, COVID) ARPGX2
Influenza A by PCR: NEGATIVE
Influenza B by PCR: NEGATIVE
SARS Coronavirus 2 by RT PCR: NEGATIVE

## 2021-10-02 MED ORDER — QUETIAPINE FUMARATE 25 MG PO TABS
50.0000 mg | ORAL_TABLET | Freq: Every day | ORAL | Status: DC
  Administered 2021-10-02: 50 mg via ORAL
  Filled 2021-10-02: qty 2

## 2021-10-02 MED ORDER — GABAPENTIN 100 MG PO CAPS
100.0000 mg | ORAL_CAPSULE | Freq: Three times a day (TID) | ORAL | Status: DC
  Administered 2021-10-02 – 2021-10-07 (×14): 100 mg via ORAL
  Filled 2021-10-02 (×14): qty 1

## 2021-10-02 MED ORDER — VITAMIN D3 25 MCG (1000 UNIT) PO TABS
500.0000 [IU] | ORAL_TABLET | Freq: Every day | ORAL | Status: DC
  Administered 2021-10-03 – 2021-10-07 (×5): 500 [IU] via ORAL
  Filled 2021-10-02 (×7): qty 0.5

## 2021-10-02 MED ORDER — TRAZODONE HCL 100 MG PO TABS
100.0000 mg | ORAL_TABLET | Freq: Every evening | ORAL | Status: DC | PRN
  Administered 2021-10-02 – 2021-10-06 (×5): 100 mg via ORAL
  Filled 2021-10-02 (×5): qty 1

## 2021-10-02 MED ORDER — ALUM & MAG HYDROXIDE-SIMETH 200-200-20 MG/5ML PO SUSP: 30.0000 mL | ORAL | Status: DC | PRN

## 2021-10-02 MED ORDER — NICOTINE POLACRILEX 2 MG MT GUM
2.0000 mg | CHEWING_GUM | OROMUCOSAL | Status: DC | PRN
  Administered 2021-10-02 – 2021-10-07 (×14): 2 mg via ORAL
  Filled 2021-10-02 (×16): qty 1

## 2021-10-02 MED ORDER — MAGNESIUM HYDROXIDE 400 MG/5ML PO SUSP: 30.0000 mL | Freq: Every day | ORAL | Status: DC | PRN

## 2021-10-02 MED ORDER — ACETAMINOPHEN 325 MG PO TABS
650.0000 mg | ORAL_TABLET | Freq: Four times a day (QID) | ORAL | Status: DC | PRN
  Administered 2021-10-07: 650 mg via ORAL
  Filled 2021-10-02: qty 2

## 2021-10-02 MED ORDER — DIVALPROEX SODIUM 500 MG PO DR TAB
500.0000 mg | DELAYED_RELEASE_TABLET | Freq: Two times a day (BID) | ORAL | Status: DC
  Administered 2021-10-02 – 2021-10-04 (×4): 500 mg via ORAL
  Filled 2021-10-02 (×4): qty 1

## 2021-10-02 NOTE — ED Notes (Signed)
Hourly rounding reveals patient in room. No complaints, stable, in no acute distress. Q15 minute rounds and monitoring via Security Cameras to continue. 

## 2021-10-02 NOTE — BH Assessment (Addendum)
Patient is to be admitted to [facility] BMU today 10/02/21 after 2:30pm pending negative Covid results by Dr. Toni Amend.  Attending Physician will be Dr. Neale Burly.   Patient has been assigned to room 305, by [facility] Charge Nurse, Ivonne Andrew.    ER staff is aware of the admission: Amy, ER Secretary   Dr. Larinda Buttery, ER MD  Carlisle Beers, Patient's Nurse  Sue Lush, Patient Access.

## 2021-10-02 NOTE — ED Notes (Signed)
VS not taken, patient asleep 

## 2021-10-02 NOTE — Progress Notes (Signed)
20 years old male patient admitted from ED with Bipolar affective disorder.Patient calm and cooperative on admission assessment. Patient stated that he does not know why he is here.Patient states " I was not in my right mind. I think I was reckless and dangerous to myself and to people." Patient denies SI,HI and AVH at this time.Patient uses marijuana daily and not compliant with his medications. Skin assessment and body search done.No contraband found. Patient oriented to unit. Dinner provided. Support and encouragement given.

## 2021-10-02 NOTE — ED Notes (Signed)
Pt discharged under IVC to BMU.  Pt cooperative.  Belongings sent with patient. 

## 2021-10-02 NOTE — ED Notes (Signed)
Meal tray given by NT 

## 2021-10-02 NOTE — BH Assessment (Signed)
Patient has been accepted to [facility] BMU pending Covid results. Awaiting accepting details.

## 2021-10-02 NOTE — Tx Team (Signed)
Initial Treatment Plan 10/02/2021 5:05 PM Christian Bond [id number]:[id number]    PATIENT STRESSORS: Medication change or noncompliance   Substance abuse     PATIENT STRENGTHS: Motivation for treatment/growth  Physical Health    PATIENT IDENTIFIED PROBLEMS: Ineffective coping skills  Noncompliance with medications  Substance abuse                 DISCHARGE CRITERIA:  Improved stabilization in mood, thinking, and/or behavior Motivation to continue treatment in a less acute level of care  PRELIMINARY DISCHARGE PLAN: Outpatient therapy Return to previous living arrangement  PATIENT/FAMILY INVOLVEMENT: This treatment plan has been presented to and reviewed with the patient, Christian Bond, and/or family member.  The patient and family have been given the opportunity to ask questions and make suggestions.  Sharin Mons, RN 10/02/2021, 5:05 PM

## 2021-10-03 DIAGNOSIS — F3163 Bipolar disorder, current episode mixed, severe, without psychotic features: Secondary | ICD-10-CM

## 2021-10-03 MED ORDER — QUETIAPINE FUMARATE 100 MG PO TABS
100.0000 mg | ORAL_TABLET | Freq: Every day | ORAL | Status: DC
  Administered 2021-10-03 – 2021-10-04 (×2): 100 mg via ORAL
  Filled 2021-10-03 (×2): qty 1

## 2021-10-03 NOTE — H&P (Signed)
Psychiatric Admission Assessment Adult  Patient Identification: Christian Bond MRN:  [id number] Date of Evaluation:  10/03/2021 Chief Complaint:  Bipolar affective disorder, mixed, severe (HCC) [F31.63] Principal Diagnosis: Bipolar affective disorder, mixed, severe (HCC) Diagnosis:  Principal Problem:   Bipolar affective disorder, mixed, severe (HCC) Active Problems:   Obsessive compulsive disorder   Attention deficit hyperactivity disorder (ADHD), combined type   Vitamin D deficiency   Cannabis-induced psychotic disorder (HCC)  CC "I feel fine now."  History of Present Illness: 20 year old male presenting under IVC for increased aggression, psychosis, threatening to kill his younger brother, and expressing suicidal ideations. No acute events overnight, medication complaints, ADLs are intact. Patient seen one-on-one today. He expressed "feeling fine" today. He feels the medications started in the ED have helped him feel "normal" and have helped his racing thoughts. Prior to admission he is able to recall poor sleep, racing thoughts, intrusive thoughts, impulsively leaving his job, and going to the police, but will not provide details. He is agreeable to the diagnosis of bipolar disorder, and does feel he was having manic episodes and severe anger outbursts. Cannot identify any triggers. He denied SI/HI/AH/Vh. However, patient appears to be responding to internal stimuli during interview, and is very guarded with his responses.   Per chart review, kicking garage door, threatening to kill his brother, not sleeping, driving erratically, going to police in his pajamas without food, breaking glass, etc.   Associated Signs/Symptoms: Depression Symptoms:  depressed mood, insomnia, feelings of worthlessness/guilt, suicidal thoughts with specific plan, Duration of Depression Symptoms: Greater than two weeks  (Hypo) Manic Symptoms:   Delusions, Distractibility, Hallucinations, Impulsivity, Irritable Mood, Anxiety Symptoms:  Excessive Worry, Panic Symptoms, Obsessive Compulsive Symptoms:   intrusive thoughts, Psychotic Symptoms:  Delusions, Hallucinations: Auditory PTSD Symptoms: Negative Total Time spent with patient: 1 hour  Past Psychiatric History:  Dx: h/o OCD since middle school, MDD for 5 years.  One suicide attempt via ingestion of 5 tablets of Lexapro.  One psych admission to [facility] in 11/2020. Past psych med trials: Lexapro, Buspar, Wellbutrin, Abilify.  No current psych meds. No current outpatient psych.  Is the patient at risk to self? Yes.    Has the patient been a risk to self in the past 6 months? No.  Has the patient been a risk to self within the distant past? Yes.    Is the patient a risk to others? Yes.    Has the patient been a risk to others in the past 6 months? No.  Has the patient been a risk to others within the distant past? No.   Prior Inpatient Therapy:   Prior Outpatient Therapy:    Alcohol Screening: 1. How often do you have a drink containing alcohol?: 2 to 4 times a month 2. How many drinks containing alcohol do you have on a typical day when you are drinking?: 1 or 2 3. How often do you have six or more drinks on one occasion?: Never AUDIT-C Score: 2 4. How often during the last year have you found that you were not able to stop drinking once you had started?: Never 5. How often during the last year have you failed to do what was normally expected from you because of drinking?: Never 6. How often during the last year have you needed a first drink in the morning to get yourself going after a heavy drinking session?: Never 7. How often during the last year have you had a feeling of guilt of remorse  after drinking?: Never 8. How often during the last year have you been unable to remember what happened the night before because you had been drinking?: Never 9. Have you or someone  else been injured as a result of your drinking?: No 10. Has a relative or friend or a doctor or another health worker been concerned about your drinking or suggested you cut down?: No Alcohol Use Disorder Identification Test Final Score (AUDIT): 2 Substance Abuse History in the last 12 months:  Yes.   Consequences of Substance Abuse: Worsening mental health, tremors after alcohol use Previous Psychotropic Medications: Yes  Psychological Evaluations: Yes  Past Medical History:  Past Medical History:  Diagnosis Date   ADHD (attention deficit hyperactivity disorder)    Allergy     Past Surgical History:  Procedure Laterality Date   TONSILLECTOMY AND ADENOIDECTOMY     Family History:  Family History  Problem Relation Age of Onset   Colon polyps Mother    Stroke Maternal Uncle    Heart disease Maternal Grandfather    Hyperlipidemia Maternal Grandfather    Stroke Maternal Grandfather    Family Psychiatric  History: Maternal great grandmothers and great aunts with schizophrenia, maternal aunt with bipolar disorder; paternal history unknown, adopted. Grandfather with alcohol use disorder.  Tobacco Screening:   Social History:  Social History   Substance and Sexual Activity  Alcohol Use No     Social History   Substance and Sexual Activity  Drug Use Yes   Types: Marijuana    Additional Social History:                           Allergies:   Allergies  Allergen Reactions   Amoxicillin Rash    Erythema multiforma   Penicillins Other (See Comments)    Blisters.   Lab Results:  Results for orders placed or performed during the hospital encounter of 09/29/21 (from the past 48 hour(s))  Resp Panel by RT-PCR (Flu A&B, Covid) Nasopharyngeal Swab     Status: None   Collection Time: 10/02/21  2:25 PM   Specimen: Nasopharyngeal Swab; Nasopharyngeal(NP) swabs in vial transport medium  Result Value Ref Range   SARS Coronavirus 2 by RT PCR NEGATIVE NEGATIVE    Comment:  (NOTE) SARS-CoV-2 target nucleic acids are NOT DETECTED.  The SARS-CoV-2 RNA is generally detectable in upper respiratory specimens during the acute phase of infection. The lowest concentration of SARS-CoV-2 viral copies this assay can detect is 138 copies/mL. A negative result does not preclude SARS-Cov-2 infection and should not be used as the sole basis for treatment or other patient management decisions. A negative result may occur with  improper specimen collection/handling, submission of specimen other than nasopharyngeal swab, presence of viral mutation(s) within the areas targeted by this assay, and inadequate number of viral copies(<138 copies/mL). A negative result must be combined with clinical observations, patient history, and epidemiological information. The expected result is Negative.  Fact Sheet for Patients:  BloggerCourse.com  Fact Sheet for Healthcare Providers:  SeriousBroker.it  This test is no t yet approved or cleared by the [place] FDA and  has been authorized for detection and/or diagnosis of SARS-CoV-2 by FDA under an Emergency Use Authorization (EUA). This EUA will remain  in effect (meaning this test can be used) for the duration of the COVID-19 declaration under Section 564(b)(1) of the Act, 21 U.S.C.section 360bbb-3(b)(1), unless the authorization is terminated  or revoked sooner.  Influenza A by PCR NEGATIVE NEGATIVE   Influenza B by PCR NEGATIVE NEGATIVE    Comment: (NOTE) The Xpert Xpress SARS-CoV-2/FLU/RSV plus assay is intended as an aid in the diagnosis of influenza from Nasopharyngeal swab specimens and should not be used as a sole basis for treatment. Nasal washings and aspirates are unacceptable for Xpert Xpress SARS-CoV-2/FLU/RSV testing.  Fact Sheet for Patients: BloggerCourse.com  Fact Sheet for Healthcare  Providers: SeriousBroker.it  This test is not yet approved or cleared by the [place] FDA and has been authorized for detection and/or diagnosis of SARS-CoV-2 by FDA under an Emergency Use Authorization (EUA). This EUA will remain in effect (meaning this test can be used) for the duration of the COVID-19 declaration under Section 564(b)(1) of the Act, 21 U.S.C. section 360bbb-3(b)(1), unless the authorization is terminated or revoked.  Performed at [facility], [address] [place], [place] [address]     Blood Alcohol level:  Lab Results  Component Value Date   [facility] <10 09/29/2021   ETH <10 12/20/2020    Metabolic Disorder Labs:  Lab Results  Component Value Date   HGBA1C 5.0 12/22/2020   MPG 96.8 12/22/2020   No results found for: PROLACTIN Lab Results  Component Value Date   CHOL 218 (H) 12/22/2020   TRIG 60 12/22/2020   HDL 46 12/22/2020   CHOLHDL 4.7 12/22/2020   VLDL 12 12/22/2020   LDLCALC 160 (H) 12/22/2020    Current Medications: Current Facility-Administered Medications  Medication Dose Route Frequency Provider Last Rate Last Admin   acetaminophen (TYLENOL) tablet 650 mg  650 mg Oral Q6H PRN Charm Rings, NP       alum & mag hydroxide-simeth (MAALOX/MYLANTA) 200-200-20 MG/5ML suspension 30 mL  30 mL Oral Q4H PRN Charm Rings, NP       divalproex (DEPAKOTE) DR tablet 500 mg  500 mg Oral Q12H Charm Rings, NP   500 mg at 10/03/21 0815   gabapentin (NEURONTIN) capsule 100 mg  100 mg Oral TID Charm Rings, NP   100 mg at 10/03/21 0815   magnesium hydroxide (MILK OF MAGNESIA) suspension 30 mL  30 mL Oral Daily PRN Charm Rings, NP       nicotine polacrilex (NICORETTE) gum 2 mg  2 mg Oral PRN Charm Rings, NP   2 mg at 10/02/21 1747   QUEtiapine (SEROQUEL) tablet 100 mg  100 mg Oral QHS Jesse Sans, MD       traZODone (DESYREL) tablet 100 mg  100 mg Oral QHS PRN Gillermo Murdoch, NP   100 mg at  10/02/21 2233   Vitamin D3 (Vitamin D) tablet 500 Units  500 Units Oral Daily Charm Rings, NP       PTA Medications: Medications Prior to Admission  Medication Sig Dispense Refill Last Dose   fluticasone (FLONASE) 50 MCG/ACT nasal spray Place 2 sprays into both nostrils daily. 16 g 6     Musculoskeletal: Strength & Muscle Tone: within normal limits Gait & Station: normal Patient leans: N/A            Psychiatric Specialty Exam:  Presentation  General Appearance: Fairly Groomed  Eye Contact:Fleeting  Speech:Blocked  Speech Volume:Normal  Handedness:Right   Mood and Affect  Mood:Anxious  Affect:Congruent   Thought Process  Thought Processes:Disorganized  Duration of Psychotic Symptoms: Greater than six months  Past Diagnosis of Schizophrenia or Psychoactive disorder: No  Descriptions of Associations:Intact  Orientation:Full (Time, Place and  Person)  Thought Content:Paranoid Ideation  Hallucinations:Hallucinations: Auditory  Ideas of Reference:Paranoia  Suicidal Thoughts:Suicidal Thoughts: Yes, Passive  Homicidal Thoughts:Homicidal Thoughts: No   Sensorium  Memory:Immediate Fair; Recent Fair; Remote Fair  Judgment:Impaired  Insight:Lacking   Executive Functions  Concentration:Fair  Attention Span:Fair  Recall:Fair  Fund of Knowledge:Fair  Language:Fair   Psychomotor Activity  Psychomotor Activity:Psychomotor Activity: Restlessness   Assets  Assets:Desire for Improvement; Financial Resources/Insurance; Housing; Social Support; Talents/Skills; Vocational/Educational; Transportation; Physical Health   Sleep  Sleep:Sleep: Poor    Physical Exam: Physical Exam ROS Blood pressure 93/61, pulse 73, temperature 98.3 F (36.8 C), temperature source Oral, resp. rate 18, height 5\' 10"  (1.778 m), weight 57.2 kg, SpO2 99 %. Body mass index is 18.08 kg/m.  Treatment Plan Summary: Daily contact with patient to assess and  evaluate symptoms and progress in treatment and Medication management 20 year old male presenting under IVC for increased aggression, psychosis, threatening to kill his younger brother, and expressing suicidal ideations. Diagnosis consistent with Bipolar I disorder, mixed episode, severe with psychotic features. Continue Depakote 500 mg BID, check VPA tomorrow morning. Increase Seroquel 100 mg QHS for mood and insomnia. Lipid panel, hemoglobin a1c   Observation Level/Precautions:  15 minute checks  Laboratory:   lipid panel, hemoglobin a1c  Psychotherapy:    Medications:    Consultations:    Discharge Concerns:    Estimated LOS:  Other:     Physician Treatment Plan for Primary Diagnosis: Bipolar affective disorder, mixed, severe (HCC) Long Term Goal(s): Improvement in symptoms so as ready for discharge  Short Term Goals: Ability to identify changes in lifestyle to reduce recurrence of condition will improve, Ability to verbalize feelings will improve, Ability to disclose and discuss suicidal ideas, Ability to demonstrate self-control will improve, Ability to identify and develop effective coping behaviors will improve, Ability to maintain clinical measurements within normal limits will improve, Compliance with prescribed medications will improve, and Ability to identify triggers associated with substance abuse/mental health issues will improve  Physician Treatment Plan for Secondary Diagnosis: Principal Problem:   Bipolar affective disorder, mixed, severe (HCC) Active Problems:   Obsessive compulsive disorder   Attention deficit hyperactivity disorder (ADHD), combined type   Vitamin D deficiency   Cannabis-induced psychotic disorder (HCC)  Long Term Goal(s): Improvement in symptoms so as ready for discharge  Short Term Goals: Ability to identify changes in lifestyle to reduce recurrence of condition will improve, Ability to verbalize feelings will improve, Ability to disclose and discuss  suicidal ideas, Ability to demonstrate self-control will improve, Ability to identify and develop effective coping behaviors will improve, Ability to maintain clinical measurements within normal limits will improve, Compliance with prescribed medications will improve, and Ability to identify triggers associated with substance abuse/mental health issues will improve  I certify that inpatient services furnished can reasonably be expected to improve the patient's condition.    26, MD 11/8/20229:54 AM

## 2021-10-03 NOTE — BHH Suicide Risk Assessment (Signed)
[facility] Admission Suicide Risk Assessment   Nursing information obtained from:  Patient Demographic factors:  Male, Unemployed, Caucasian Current Mental Status:  NA Loss Factors:  NA Historical Factors:  NA Risk Reduction Factors:  Living with another person, especially a relative  Total Time spent with patient: 1 hour Principal Problem: Bipolar affective disorder, mixed, severe (HCC) Diagnosis:  Principal Problem:   Bipolar affective disorder, mixed, severe (HCC) Active Problems:   Obsessive compulsive disorder   Attention deficit hyperactivity disorder (ADHD), combined type   Vitamin D deficiency   Cannabis-induced psychotic disorder (HCC)  Subjective Data: 20 year old male presenting under IVC for increased aggression, psychosis, threatening to kill his younger brother, and expressing suicidal ideations. No acute events overnight, medication complaints, ADLs are intact. Patient seen one-on-one today. He expressed "feeling fine" today. He feels the medications started in the ED have helped him feel "normal" and have helped his racing thoughts. Prior to admission he is able to recall poor sleep, racing thoughts, intrusive thoughts, impulsively leaving his job, and going to the police, but will not provide details. He is agreeable to the diagnosis of bipolar disorder, and does feel he was having manic episodes and severe anger outbursts. Cannot identify any triggers. He denied SI/HI/AH/Vh. However, patient appears to be responding to internal stimuli during interview, and is very guarded with his responses.    Per chart review, kicking garage door, threatening to kill his brother, not sleeping, driving erratically, going to police in his pajamas without food, breaking glass, etc.   Continued Clinical Symptoms:  Alcohol Use Disorder Identification Test Final Score (AUDIT): 2 The "Alcohol Use Disorders Identification Test", Guidelines for Use in Primary Care, Second Edition.  World Environmental consultant [facility]). Score between 0-7:  no or low risk or alcohol related problems. Score between 8-15:  moderate risk of alcohol related problems. Score between 16-19:  high risk of alcohol related problems. Score 20 or above:  warrants further diagnostic evaluation for alcohol dependence and treatment.   CLINICAL FACTORS:   Severe Anxiety and/or Agitation Bipolar Disorder:   Mixed State Obsessive-Compulsive Disorder Unstable or Poor Therapeutic Relationship Previous Psychiatric Diagnoses and Treatments   Musculoskeletal: Strength & Muscle Tone: within normal limits Gait & Station: normal Patient leans: N/A  Psychiatric Specialty Exam:  Presentation  General Appearance: Fairly Groomed  Eye Contact:Fleeting  Speech:Blocked  Speech Volume:Normal  Handedness:Right   Mood and Affect  Mood:Anxious  Affect:Congruent   Thought Process  Thought Processes:Disorganized  Descriptions of Associations:Intact  Orientation:Full (Time, Place and Person)  Thought Content:Paranoid Ideation  History of Schizophrenia/Schizoaffective disorder:No  Duration of Psychotic Symptoms:Greater than six months  Hallucinations:Hallucinations: Auditory  Ideas of Reference:Paranoia  Suicidal Thoughts:Suicidal Thoughts: Yes, Passive  Homicidal Thoughts:Homicidal Thoughts: No   Sensorium  Memory:Immediate Fair; Recent Fair; Remote Fair  Judgment:Impaired  Insight:Lacking   Executive Functions  Concentration:Fair  Attention Span:Fair  Recall:Fair  Fund of Knowledge:Fair  Language:Fair   Psychomotor Activity  Psychomotor Activity:Psychomotor Activity: Restlessness   Assets  Assets:Desire for Improvement; Financial Resources/Insurance; Housing; Social Support; Talents/Skills; Vocational/Educational; Transportation; Physical Health   Sleep  Sleep:Sleep: Poor    Physical Exam: Physical Exam ROS Blood pressure 93/61, pulse 73, temperature 98.3 F (36.8 C),  temperature source Oral, resp. rate 18, height 5\' 10"  (1.778 m), weight 57.2 kg, SpO2 99 %. Body mass index is 18.08 kg/m.   COGNITIVE FEATURES THAT CONTRIBUTE TO RISK:  Loss of executive function and Polarized thinking    SUICIDE RISK:   Mild:  Suicidal ideation  of limited frequency, intensity, duration, and specificity.  There are no identifiable plans, no associated intent, mild dysphoria and related symptoms, good self-control (both objective and subjective assessment), few other risk factors, and identifiable protective factors, including available and accessible social support.  PLAN OF CARE: Continue inpatient admission, see H&P for details.   I certify that inpatient services furnished can reasonably be expected to improve the patient's condition.   Jesse Sans, MD 10/03/2021, 10:05 AM

## 2021-10-03 NOTE — Progress Notes (Signed)
Recreation Therapy Notes  Date: 10/03/2021  Time: 10:00 am   Location: Craft room    Behavioral response: Appropriate  Intervention Topic: Self-care    Discussion/Intervention:  Group content today was focused on Self-Care. The group defined self-care and some positive ways they care for themselves. Individuals expressed ways and reasons why they neglected any self-care in the past. Patients described ways to improve self-care in the future. The group explained what could happen if they did not do any self-care activities at all. The group participated in the intervention "self-care assessment" where they had a chance to discover some of their weaknesses and strengths in self- care. Patient came up with a self-care plan to improve themselves in the future.  Clinical Observations/Feedback: Patient came to group and defined self care as doing activities you like. Individual was social with peers and staff while participating in the intervention. Layan Zalenski LRT/CTRS             Marlee Armenteros 10/03/2021 11:46 AM

## 2021-10-03 NOTE — Progress Notes (Signed)
Pt has attended all groups today. Pt has received PRNs and been med compliant. Pt has a hopeful affect. Torrie Mayers RN

## 2021-10-03 NOTE — Plan of Care (Signed)
  Problem: Education: Goal: Knowledge of [facility] Education information/materials will improve Outcome: Progressing Goal: Emotional status will improve Outcome: Progressing Goal: Mental status will improve Outcome: Progressing Goal: Verbalization of understanding the information provided will improve Outcome: Progressing   Problem: Activity: Goal: Interest or engagement in activities will improve Outcome: Progressing Goal: Sleeping patterns will improve Outcome: Progressing   Problem: Coping: Goal: Ability to verbalize frustrations and anger appropriately will improve Outcome: Progressing Goal: Ability to demonstrate self-control will improve Outcome: Progressing   Problem: Health Behavior/Discharge Planning: Goal: Identification of resources available to assist in meeting health care needs will improve Outcome: Progressing Goal: Compliance with treatment plan for underlying cause of condition will improve Outcome: Progressing   Problem: Physical Regulation: Goal: Ability to maintain clinical measurements within normal limits will improve Outcome: Progressing   Problem: Safety: Goal: Periods of time without injury will increase Outcome: Progressing   Problem: Education: Goal: Ability to state activities that reduce stress will improve Outcome: Progressing   Problem: Coping: Goal: Ability to identify and develop effective coping behavior will improve Outcome: Progressing   Problem: Self-Concept: Goal: Ability to identify factors that promote anxiety will improve Outcome: Progressing Goal: Level of anxiety will decrease Outcome: Progressing Goal: Ability to modify response to factors that promote anxiety will improve Outcome: Progressing   

## 2021-10-03 NOTE — Group Note (Signed)
[facility] LCSW Group Therapy Note   Group Date: 10/03/2021 Start Time: 1300 End Time: 1400  Type of Therapy/Topic:  Group Therapy:  Feelings about Diagnosis  Participation Level:  Active   Mood: n/a    Description of Group:    This group will allow patients to explore their thoughts and feelings about diagnoses they have received. Patients will be guided to explore their level of understanding and acceptance of these diagnoses. Facilitator will encourage patients to process their thoughts and feelings about the reactions of others to their diagnosis, and will guide patients in identifying ways to discuss their diagnosis with significant others in their lives. This group will be process-oriented, with patients participating in exploration of their own experiences as well as giving and receiving support and challenge from other group members.   Therapeutic Goals: 1. Patient will demonstrate understanding of diagnosis as evidence by identifying two or more symptoms of the disorder:  2. Patient will be able to express two feelings regarding the diagnosis 3. Patient will demonstrate ability to communicate their needs through discussion and/or role plays  Summary of Patient Progress: Patient was present for the entirety of the group session. Patient was an active listener and participated in the topic of discussion, provided helpful advice to others, and added nuance to topic of conversation. Patient shared that he was bothered by stigma related to mental health.     Therapeutic Modalities:   Cognitive Behavioral Therapy Brief Therapy Feelings Identification    Corky Crafts, [place]

## 2021-10-03 NOTE — Progress Notes (Signed)
Patient has been pleasant and cooperative. Denies SI, HI and AVH. Has been somewhat restless and pacing. Unable to sleep. PRN order for Trazodone obtained and given. No other complaints

## 2021-10-03 NOTE — Plan of Care (Signed)
Pt denies depression, SI, HI and AVH. Pt rates anxiety 5/10. Pt was educated on care plan and verbalizes understanding. Torrie Mayers RN Problem: Education: Goal: Knowledge of [facility] Education information/materials will improve Outcome: Progressing Goal: Emotional status will improve Outcome: Progressing Goal: Mental status will improve Outcome: Progressing Goal: Verbalization of understanding the information provided will improve Outcome: Progressing   Problem: Activity: Goal: Interest or engagement in activities will improve Outcome: Progressing Goal: Sleeping patterns will improve Outcome: Progressing   Problem: Coping: Goal: Ability to verbalize frustrations and anger appropriately will improve Outcome: Progressing Goal: Ability to demonstrate self-control will improve Outcome: Progressing   Problem: Health Behavior/Discharge Planning: Goal: Identification of resources available to assist in meeting health care needs will improve Outcome: Progressing Goal: Compliance with treatment plan for underlying cause of condition will improve Outcome: Progressing   Problem: Physical Regulation: Goal: Ability to maintain clinical measurements within normal limits will improve Outcome: Progressing   Problem: Safety: Goal: Periods of time without injury will increase Outcome: Progressing   Problem: Education: Goal: Ability to state activities that reduce stress will improve Outcome: Progressing   Problem: Coping: Goal: Ability to identify and develop effective coping behavior will improve Outcome: Progressing   Problem: Self-Concept: Goal: Ability to identify factors that promote anxiety will improve Outcome: Progressing Goal: Level of anxiety will decrease Outcome: Progressing Goal: Ability to modify response to factors that promote anxiety will improve Outcome: Progressing

## 2021-10-03 NOTE — Progress Notes (Signed)
Recreation Therapy Notes  INPATIENT RECREATION THERAPY ASSESSMENT  Patient Details Name: Christian Bond MRN: [id number] DOB: Sep 29, 2001 Today's Date: 10/03/2021       Information Obtained From: Patient  Able to Participate in Assessment/Interview: Yes  Patient Presentation: Responsive  Reason for Admission (Per Patient): Active Symptoms, Impulsive Behavior  Patient Stressors:    Coping Skills:   Substance Abuse, Meditate, Talk, Other (Comment) (Medication)  Leisure Interests (2+):  Exercise - Walking, Exercise - Running, [employer], Art - Paint, Art - Coloring, Music - Listen  Frequency of Recreation/Participation: Weekly  Awareness of Community Resources:  Yes  Community Resources:  Research scientist (physical sciences), Engineering geologist, [employer], [employer], [employer], [place], [place]  Current Use: No  If no, Barriers?: Attitudinal  Expressed Interest in [employer] Information: No  [employer] of Residence:  Film/video editor  Patient Main Form of Transportation: Set designer  Patient Strengths:  Compassionate, Smart  Patient Identified Areas of Improvement:  Impulse control, Follwing through with things I say I am going to do.  Patient Goal for Hospitalization:  Reset self and get back on track.  Current SI (including self-harm):  No  Current HI:  No  Current AVH: No  Staff Intervention Plan: Group Attendance, Collaborate with Interdisciplinary Treatment Team  Consent to Intern Participation: N/A  Christian Bond 10/03/2021, 12:13 PM

## 2021-10-03 NOTE — Progress Notes (Signed)
Recreation Therapy Notes  INPATIENT RECREATION TR PLAN  Patient Details Name: GARRET TEALE MRN: [id number] DOB: [birth year] Today's Date: 10/03/2021  Rec Therapy Plan Is patient appropriate for Therapeutic Recreation?: Yes Treatment times per week: at least 3 Estimated Length of Stay: 5-7 days TR Treatment/Interventions: Group participation (Comment)  Discharge Criteria Pt will be discharged from therapy if:: Discharged Treatment plan/goals/alternatives discussed and agreed upon by:: Patient/family  Discharge Summary     Gisel Vipond 10/03/2021, 12:14 PM
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
## 2021-10-04 DIAGNOSIS — F3163 Bipolar disorder, current episode mixed, severe, without psychotic features: Secondary | ICD-10-CM | POA: Diagnosis not present

## 2021-10-04 LAB — LIPID PANEL
Cholesterol: 146 mg/dL (ref 0–200)
HDL: 49 mg/dL (ref >40)
LDL Cholesterol: 81 mg/dL (ref 0–99)
Total CHOL/HDL Ratio: 3 RATIO
Triglycerides: 78 mg/dL (ref <150)
VLDL: 16 mg/dL (ref 0–40)

## 2021-10-04 LAB — HEMOGLOBIN A1C
Hgb A1c MFr Bld: 5.3 % (ref 4.8–5.6)
Mean Plasma Glucose: 105.41 mg/dL

## 2021-10-04 LAB — VALPROIC ACID LEVEL: Valproic Acid Lvl: 127 ug/mL — ABNORMAL HIGH (ref 50.0–100.0)

## 2021-10-04 MED ORDER — DIVALPROEX SODIUM 250 MG PO DR TAB
250.0000 mg | DELAYED_RELEASE_TABLET | Freq: Every morning | ORAL | Status: DC
  Administered 2021-10-04 – 2021-10-07 (×4): 250 mg via ORAL
  Filled 2021-10-04 (×4): qty 1

## 2021-10-04 MED ORDER — DIVALPROEX SODIUM 500 MG PO DR TAB
500.0000 mg | DELAYED_RELEASE_TABLET | Freq: Every day | ORAL | Status: DC
  Administered 2021-10-05 – 2021-10-06 (×2): 500 mg via ORAL
  Filled 2021-10-04 (×2): qty 1

## 2021-10-04 NOTE — Progress Notes (Signed)
Patient has been visible in Milieu this shift interacting well with [facility] and staff compliant with medication. Stated his goal is to try and work on his addiction to [facility] stated that all his friends uses [facility] and its hard for him to stop in that environment. Prn Trazodone given and effective. No adverse drug noted. Patient remains safe. Denies SI/HI/A/VH/

## 2021-10-04 NOTE — BH IP Treatment Plan (Signed)
Interdisciplinary Treatment and Diagnostic Plan Update  10/04/2021 Time of Session: 9:00 AM Christian Bond MRN: [id number]  Principal Diagnosis: Bipolar affective disorder, mixed, severe (HCC)  Secondary Diagnoses: Principal Problem:   Bipolar affective disorder, mixed, severe (HCC) Active Problems:   Obsessive compulsive disorder   Attention deficit hyperactivity disorder (ADHD), combined type   Vitamin D deficiency   Cannabis-induced psychotic disorder (HCC)   Current Medications:  Current Facility-Administered Medications  Medication Dose Route Frequency Provider Last Rate Last Admin   acetaminophen (TYLENOL) tablet 650 mg  650 mg Oral Q6H PRN Charm Rings, NP       alum & mag hydroxide-simeth (MAALOX/MYLANTA) 200-200-20 MG/5ML suspension 30 mL  30 mL Oral Q4H PRN Charm Rings, NP       divalproex (DEPAKOTE) DR tablet 500 mg  500 mg Oral Q12H Charm Rings, NP   500 mg at 10/04/21 4098   gabapentin (NEURONTIN) capsule 100 mg  100 mg Oral TID Charm Rings, NP   100 mg at 10/04/21 1191   magnesium hydroxide (MILK OF MAGNESIA) suspension 30 mL  30 mL Oral Daily PRN Charm Rings, NP       nicotine polacrilex (NICORETTE) gum 2 mg  2 mg Oral PRN Charm Rings, NP   2 mg at 10/03/21 2122   QUEtiapine (SEROQUEL) tablet 100 mg  100 mg Oral QHS Jesse Sans, MD   100 mg at 10/03/21 2121   traZODone (DESYREL) tablet 100 mg  100 mg Oral QHS PRN Gillermo Murdoch, NP   100 mg at 10/03/21 2235   Vitamin D3 (Vitamin D) tablet 500 Units  500 Units Oral Daily Charm Rings, NP   500 Units at 10/04/21 4782   PTA Medications: Medications Prior to Admission  Medication Sig Dispense Refill Last Dose   fluticasone (FLONASE) 50 MCG/ACT nasal spray Place 2 sprays into both nostrils daily. 16 g 6     Patient Stressors: Medication change or noncompliance   Substance abuse    Patient Strengths: Motivation for treatment/growth  Physical Health   Treatment Modalities:  Medication Management, Group therapy, Case management,  1 to 1 session with clinician, Psychoeducation, Recreational therapy.   Physician Treatment Plan for Primary Diagnosis: Bipolar affective disorder, mixed, severe (HCC) Long Term Goal(s): Improvement in symptoms so as ready for discharge   Short Term Goals: Ability to identify changes in lifestyle to reduce recurrence of condition will improve Ability to verbalize feelings will improve Ability to disclose and discuss suicidal ideas Ability to demonstrate self-control will improve Ability to identify and develop effective coping behaviors will improve Ability to maintain clinical measurements within normal limits will improve Compliance with prescribed medications will improve Ability to identify triggers associated with substance abuse/mental health issues will improve  Medication Management: Evaluate patient's response, side effects, and tolerance of medication regimen.  Therapeutic Interventions: 1 to 1 sessions, Unit Group sessions and Medication administration.  Evaluation of Outcomes: Progressing  Physician Treatment Plan for Secondary Diagnosis: Principal Problem:   Bipolar affective disorder, mixed, severe (HCC) Active Problems:   Obsessive compulsive disorder   Attention deficit hyperactivity disorder (ADHD), combined type   Vitamin D deficiency   Cannabis-induced psychotic disorder (HCC)  Long Term Goal(s): Improvement in symptoms so as ready for discharge   Short Term Goals: Ability to identify changes in lifestyle to reduce recurrence of condition will improve Ability to verbalize feelings will improve Ability to disclose and discuss suicidal ideas Ability to demonstrate self-control will  improve Ability to identify and develop effective coping behaviors will improve Ability to maintain clinical measurements within normal limits will improve Compliance with prescribed medications will improve Ability to identify  triggers associated with substance abuse/mental health issues will improve     Medication Management: Evaluate patient's response, side effects, and tolerance of medication regimen.  Therapeutic Interventions: 1 to 1 sessions, Unit Group sessions and Medication administration.  Evaluation of Outcomes: Progressing   RN Treatment Plan for Primary Diagnosis: Bipolar affective disorder, mixed, severe (HCC) Long Term Goal(s): Knowledge of disease and therapeutic regimen to maintain health will improve  Short Term Goals: Ability to remain free from injury will improve, Ability to verbalize frustration and anger appropriately will improve, Ability to demonstrate self-control, Ability to participate in decision making will improve, Ability to verbalize feelings will improve, Ability to disclose and discuss suicidal ideas, Ability to identify and develop effective coping behaviors will improve, and Compliance with prescribed medications will improve  Medication Management: RN will administer medications as ordered by provider, will assess and evaluate patient's response and provide education to patient for prescribed medication. RN will report any adverse and/or side effects to prescribing provider.  Therapeutic Interventions: 1 on 1 counseling sessions, Psychoeducation, Medication administration, Evaluate responses to treatment, Monitor vital signs and CBGs as ordered, Perform/monitor CIWA, COWS, AIMS and Fall Risk screenings as ordered, Perform wound care treatments as ordered.  Evaluation of Outcomes: Progressing   LCSW Treatment Plan for Primary Diagnosis: Bipolar affective disorder, mixed, severe (HCC) Long Term Goal(s): Safe transition to appropriate next level of care at discharge, Engage patient in therapeutic group addressing interpersonal concerns.  Short Term Goals: Engage patient in aftercare planning with referrals and resources, Increase social support, Increase emotional regulation,  Facilitate acceptance of mental health diagnosis and concerns, Facilitate patient progression through stages of change regarding substance use diagnoses and concerns, Identify triggers associated with mental health/substance abuse issues, and Increase skills for wellness and recovery  Therapeutic Interventions: Assess for all discharge needs, 1 to 1 time with Social worker, Explore available resources and support systems, Assess for adequacy in community support network, Educate family and significant other(s) on suicide prevention, Complete Psychosocial Assessment, Interpersonal group therapy.  Evaluation of Outcomes: Progressing   Progress in Treatment: Attending groups: Yes. Participating in groups: Yes. Taking medication as prescribed: Yes. Toleration medication: Yes. Family/Significant other contact made: No, will contact:  mother, Leonce Bale. Patient understands diagnosis: No. Discussing patient identified problems/goals with staff: Yes. Medical problems stabilized or resolved: Yes. Denies suicidal/homicidal ideation: Yes. Issues/concerns per patient self-inventory: No. Other: none.  New problem(s) identified: No, Describe:  none.  New Short Term/Long Term Goal(s): elimination of symptoms of psychosis, medication management for mood stabilization; elimination of SI thoughts; development of comprehensive mental wellness/sobriety plan.  Patient Goals: "Maybe I can get some more self control."   Discharge Plan or Barriers: CSW will assist pt with development of an appropriate aftercare/discharge plan.   Reason for Continuation of Hospitalization: Aggression Medication stabilization Suicidal ideation  Estimated Length of Stay: 1-7 days   Scribe for Treatment Team: [name], LCSW 10/04/2021 9:22 AM

## 2021-10-04 NOTE — BHH Counselor (Signed)
CSW contacted [facility] [phone]). CSW spoke with [employer] regarding their admission process. She stated that pt would need a PCR Covid test and to have information sent over including H&P, progress notes, MAR, BPA/PSA, and labs. Lesly Rubenstein was also given a brief synopsis of what was going on with the pt. She collected CSW contact information to follow up and gave him number to send information (fax [phone]). CSW informed Lesly Rubenstein that mother had already spoken with Alaina. CSW was informed that his contact information would be forwarded to Alaina and that the fax should be made to her attention. No other concerns expressed. Contact ended without incident.   CSW spoke with pt to confirm that it was ok to send over his information. He agreed. CSW also informed him that he had spoken with pt's mother and that his parents were coming to visit him this evening. No other concerns expressed. Contact ended without incident.   CSW faxed requested information over to attn: Alaina.  CSW was contacted by [facility]. She confirmed that CSW was aware of need for COVID testing and medication. Screening arranged for 10 tomorrow morning. No other concerns expressed. Contact ended without incident.   CSW updated pt regarding screening.   Vilma Meckel. Algis Greenhouse, MSW, LCSW, LCAS 10/04/2021 3:55 PM

## 2021-10-04 NOTE — Progress Notes (Signed)
[facility] MD Progress Note  10/04/2021 10:14 AM Christian Bond  MRN:  [id number]  CC "I feel fine."  Subjective:  20 year old male presenting under IVC for increased aggression, psychosis, threatening to kill his younger brother, and expressing suicidal ideations. No acute events overnight, medication complaints, ADLs are intact. Patient seen during treatment team and again one-on-one today. He continues to have some thought blocking and confusion on exam. He denies SI/HI/AH/VH, but is clearly responding to internal stimuli, and often loses track of what he is saying mid sentence. He seems to fear he will be unable to return home at discharge despite parental support. He also feels embarrassed about his actions. Education about diagnosis, plan of care, and expectant management provided. He is expressing interest in substance abuse treatment once stabilized.   Principal Problem: Bipolar affective disorder, mixed, severe (HCC) Diagnosis: Principal Problem:   Bipolar affective disorder, mixed, severe (HCC) Active Problems:   Obsessive compulsive disorder   Attention deficit hyperactivity disorder (ADHD), combined type   Vitamin D deficiency   Cannabis-induced psychotic disorder (HCC)  Total Time spent with patient: 30 minutes  Past Psychiatric History: See H&P  Past Medical History:  Past Medical History:  Diagnosis Date   ADHD (attention deficit hyperactivity disorder)    Allergy     Past Surgical History:  Procedure Laterality Date   TONSILLECTOMY AND ADENOIDECTOMY     Family History:  Family History  Problem Relation Age of Onset   Colon polyps Mother    Stroke Maternal Uncle    Heart disease Maternal Grandfather    Hyperlipidemia Maternal Grandfather    Stroke Maternal Grandfather    Family Psychiatric  History: See H&P Social History:  Social History   Substance and Sexual Activity  Alcohol Use No     Social History   Substance and Sexual Activity  Drug Use Yes    Types: Marijuana    Social History   Socioeconomic History   Marital status: Single    Spouse name: Not on file   Number of children: Not on file   Years of education: Not on file   Highest education level: Not on file  Occupational History   Not on file  Tobacco Use   Smoking status: Never   Smokeless tobacco: Never  Vaping Use   Vaping Use: Some days  Substance and Sexual Activity   Alcohol use: No   Drug use: Yes    Types: Marijuana   Sexual activity: Not on file  Other Topics Concern   Not on file  Social History Narrative   Not on file   Social Determinants of Health   Financial Resource Strain: Not on file  Food Insecurity: Not on file  Transportation Needs: Not on file  Physical Activity: Not on file  Stress: Not on file  Social Connections: Not on file   Additional Social History:                         Sleep: Fair  Appetite:  Fair  Current Medications: Current Facility-Administered Medications  Medication Dose Route Frequency Provider Last Rate Last Admin   acetaminophen (TYLENOL) tablet 650 mg  650 mg Oral Q6H PRN Charm Rings, NP       alum & mag hydroxide-simeth (MAALOX/MYLANTA) 200-200-20 MG/5ML suspension 30 mL  30 mL Oral Q4H PRN Charm Rings, NP       divalproex (DEPAKOTE) DR tablet 250 mg  250 mg Oral q  AM Jesse Sans, MD       [START ON 10/05/2021] divalproex (DEPAKOTE) DR tablet 500 mg  500 mg Oral QHS Jesse Sans, MD       gabapentin (NEURONTIN) capsule 100 mg  100 mg Oral TID Charm Rings, NP   100 mg at 10/04/21 3149   magnesium hydroxide (MILK OF MAGNESIA) suspension 30 mL  30 mL Oral Daily PRN Charm Rings, NP       nicotine polacrilex (NICORETTE) gum 2 mg  2 mg Oral PRN Charm Rings, NP   2 mg at 10/03/21 2122   QUEtiapine (SEROQUEL) tablet 100 mg  100 mg Oral QHS Jesse Sans, MD   100 mg at 10/03/21 2121   traZODone (DESYREL) tablet 100 mg  100 mg Oral QHS PRN Gillermo Murdoch, NP   100 mg at  10/03/21 2235   Vitamin D3 (Vitamin D) tablet 500 Units  500 Units Oral Daily Charm Rings, NP   500 Units at 10/04/21 7026    Lab Results:  Results for orders placed or performed during the hospital encounter of 10/02/21 (from the past 48 hour(s))  Lipid panel     Status: None   Collection Time: 10/04/21  6:40 AM  Result Value Ref Range   Cholesterol 146 0 - 200 mg/dL   Triglycerides 78 <378 mg/dL   HDL 49 >58 mg/dL   Total CHOL/HDL Ratio 3.0 RATIO   VLDL 16 0 - 40 mg/dL   LDL Cholesterol 81 0 - 99 mg/dL    Comment:        Total Cholesterol/HDL:CHD Risk Coronary Heart Disease Risk Table                     Men   Women  1/2 Average Risk   3.4   3.3  Average Risk       5.0   4.4  2 X Average Risk   9.6   7.1  3 X Average Risk  23.4   11.0        Use the calculated Patient Ratio above and the CHD Risk Table to determine the patient's CHD Risk.        ATP III CLASSIFICATION (LDL):  <100     mg/dL   Optimal  850-277  mg/dL   Near or Above                    Optimal  130-159  mg/dL   Borderline  412-878  mg/dL   High  >676     mg/dL   Very High Performed at [facility] [address] [place], [place] [address]   Hemoglobin A1c     Status: None   Collection Time: 10/04/21  6:40 AM  Result Value Ref Range   Hgb A1c MFr Bld 5.3 4.8 - 5.6 %    Comment: (NOTE) Pre diabetes:          5.7%-6.4%  Diabetes:              >6.4%  Glycemic control for   <7.0% adults with diabetes    Mean Plasma Glucose 105.41 mg/dL    Comment: Performed at [facility], [address] [place], [place] [address]  Valproic acid level     Status: Abnormal   Collection Time: 10/04/21  6:40 AM  Result Value Ref Range   Valproic Acid Lvl 127 (H) 50.0 - 100.0 ug/mL  Comment: Performed at [facility], [address] [place], [place] [address]    Blood Alcohol level:  Lab Results  Component Value Date   [facility] <10 09/29/2021   ETH <10 12/20/2020    Metabolic  Disorder Labs: Lab Results  Component Value Date   HGBA1C 5.3 10/04/2021   MPG 105.41 10/04/2021   MPG 96.8 12/22/2020   No results found for: PROLACTIN Lab Results  Component Value Date   CHOL 146 10/04/2021   TRIG 78 10/04/2021   HDL 49 10/04/2021   CHOLHDL 3.0 10/04/2021   VLDL 16 10/04/2021   LDLCALC 81 10/04/2021   LDLCALC 160 (H) 12/22/2020    Physical Findings: AIMS:  , ,  ,  ,    CIWA:    COWS:     Musculoskeletal: Strength & Muscle Tone: within normal limits Gait & Station: normal Patient leans: N/A  Psychiatric Specialty Exam:  Presentation  General Appearance: Fairly Groomed  Eye Contact:Fleeting  Speech:Blocked  Speech Volume:Normal  Handedness:Right   Mood and Affect  Mood:Anxious  Affect:Congruent   Thought Process  Thought Processes:Disorganized  Descriptions of Associations:Intact  Orientation:Full (Time, Place and Person)  Thought Content:Paranoid Ideation  History of Schizophrenia/Schizoaffective disorder:No  Duration of Psychotic Symptoms:Greater than six months  Hallucinations:Hallucinations: Auditory  Ideas of Reference:Paranoia  Suicidal Thoughts:Suicidal Thoughts: Yes, Passive  Homicidal Thoughts:Homicidal Thoughts: No   Sensorium  Memory:Immediate Fair; Recent Fair; Remote Fair  Judgment:Impaired  Insight:Lacking   Executive Functions  Concentration:Fair  Attention Span:Fair  Recall:Fair  Fund of Knowledge:Fair  Language:Fair   Psychomotor Activity  Psychomotor Activity:Psychomotor Activity: Restlessness   Assets  Assets:Desire for Improvement; Financial Resources/Insurance; Housing; Social Support; Talents/Skills; Vocational/Educational; Transportation; Physical Health   Sleep  Sleep:Sleep: Poor    Physical Exam: Physical Exam ROS Blood pressure (!) 106/58, pulse (!) 58, temperature 97.7 F (36.5 C), temperature source Oral, resp. rate 18, height 5\' 10"  (1.778 m), weight 57.2 kg, SpO2  100 %. Body mass index is 18.08 kg/m.   Treatment Plan Summary: Daily contact with patient to assess and evaluate symptoms and progress in treatment and Medication management 20 year old male presenting under IVC for increased aggression, psychosis, threatening to kill his younger brother, and expressing suicidal ideations. Diagnosis consistent with Bipolar I disorder, mixed episode, severe with psychotic features. VPA level slightly elevated at 127. Decrease Depakote to 250 mg QAM, 500 mg QHS. Continue Seroquel 100 mg QHS for mood and insomnia and titrate to effect.  Lipid panel WNL, hemoglobin a1c pending.   26, MD 10/04/2021, 10:14 AM

## 2021-10-04 NOTE — Progress Notes (Signed)
Patient calm and pleasant during assessment denying SI/HI/AVH. Pt observed interacting appropriately with staff and peers on the unit. Pt complaint with medication administration per MD orders. Pt given education, support, and encouragement to be active in his treatment plan. Pt being monitored Q 15 minutes for safety per unit protocol. Pt remains safe on the unit.

## 2021-10-04 NOTE — [facility] Suicide Risk Assessment (Signed)
[facility] INPATIENT:  Family/Significant Other Suicide Prevention Education  Suicide Prevention Education:  Education Completed; Tabithe Braid/mother [phone]), has been identified by the patient as the family member/significant other with whom the patient will be residing, and identified as the person(s) who will aid the patient in the event of a mental health crisis (suicidal ideations/suicide attempt).  With written consent from the patient, the family member/significant other has been provided the following suicide prevention education, prior to the and/or following the discharge of the patient.  The suicide prevention education provided includes the following: Suicide risk factors Suicide prevention and interventions National Suicide Hotline telephone number [facility] assessment telephone number [facility] Emergency Assistance [address] [facility] and/or Residential Mobile Crisis Unit telephone number  Request made of family/significant other to: Remove weapons (e.g., guns, rifles, knives), all items previously/currently identified as safety concern.   Remove drugs/medications (over-the-counter, prescriptions, illicit drugs), all items previously/currently identified as a safety concern.  The family member/significant other verbalizes understanding of the suicide prevention education information provided.  The family member/significant other agrees to remove the items of safety concern listed above.  Ledford and CSW discussed aftercare plans. She stated that they would like pt to go to another facility from this one. She shared that after leaving here last time he had a wait before he was accepted in [facility], and during this time he stopped taking his medication. Deyoe stated that they would like him to go directly to a facility and that they had spoken with the ED provider over the weekend about [facility] in [place]. She shared that they had already contacted  the place and there is bed availability and that they accept their insurance. CSW explained that the team would work with pt to get him connected with them. She inquired regarding visitation hours and was updated about these. No other concerns expressed. Contact ended without incident.   [name] 10/04/2021, 2:23 PM

## 2021-10-04 NOTE — Plan of Care (Signed)
Pt rtaes depression and hoplessness both 3/10 and anxiety 1-3. Pt denies SI, HI and AVH. Pt was educated on care plan and verbalizes understanding. Torrie Mayers RN Problem: Education: Goal: Knowledge of [facility] Education information/materials will improve Outcome: Progressing Goal: Emotional status will improve Outcome: Progressing Goal: Mental status will improve Outcome: Progressing Goal: Verbalization of understanding the information provided will improve Outcome: Progressing   Problem: Activity: Goal: Interest or engagement in activities will improve Outcome: Progressing Goal: Sleeping patterns will improve Outcome: Progressing   Problem: Coping: Goal: Ability to verbalize frustrations and anger appropriately will improve Outcome: Progressing Goal: Ability to demonstrate self-control will improve Outcome: Progressing   Problem: Health Behavior/Discharge Planning: Goal: Identification of resources available to assist in meeting health care needs will improve Outcome: Progressing Goal: Compliance with treatment plan for underlying cause of condition will improve Outcome: Progressing   Problem: Physical Regulation: Goal: Ability to maintain clinical measurements within normal limits will improve Outcome: Progressing   Problem: Safety: Goal: Periods of time without injury will increase Outcome: Progressing   Problem: Education: Goal: Ability to state activities that reduce stress will improve Outcome: Progressing   Problem: Coping: Goal: Ability to identify and develop effective coping behavior will improve Outcome: Progressing   Problem: Coping: Goal: Ability to identify and develop effective coping behavior will improve Outcome: Progressing   Problem: Self-Concept: Goal: Ability to identify factors that promote anxiety will improve Outcome: Progressing Goal: Level of anxiety will decrease Outcome: Progressing Goal: Ability to modify response to  factors that promote anxiety will improve Outcome: Progressing

## 2021-10-04 NOTE — Progress Notes (Signed)
Recreation Therapy Notes   Date: 10/04/2021  Time: 10:15 am   Location: Craft room  Behavioral response: Appropriate  Intervention Topic: Leisure    Discussion/Intervention:  Group content today was focused on leisure. The group defined what leisure is and some positive leisure activities they participate in. Individuals identified the difference between good and bad leisure. Participants expressed how they feel after participating in the leisure of their choice. The group discussed how they go about picking a leisure activity and if others are involved in their leisure activities. The patient stated how many leisure activities they have to choose from and reasons why it is important to have leisure time. Individuals participated in the intervention "Exploration of Leisure" where they had a chance to identify new leisure activities as well as benefits of leisure. Clinical Observations/Feedback: Patient came to group and defined leisure as something you participate in that has no productive value but you enjoy it. He stated that he likes to participate in kayaking for leisure. Individual was social with staff while participating in the intervention.  Huck Ashworth LRT/CTRS         Fran Mcree 10/04/2021 12:29 PM

## 2021-10-04 NOTE — Group Note (Signed)
[facility] LCSW Group Therapy Note   Group Date: 10/04/2021 Start Time: 1300 End Time: 1350   Type of Therapy/Topic:  Group Therapy:  Emotion Regulation  Participation Level:  Active   Mood:  Description of Group:    The purpose of this group is to assist patients in learning to regulate negative emotions and experience positive emotions. Patients will be guided to discuss ways in which they have been vulnerable to their negative emotions. These vulnerabilities will be juxtaposed with experiences of positive emotions or situations, and patients challenged to use positive emotions to combat negative ones. Special emphasis will be placed on coping with negative emotions in conflict situations, and patients will process healthy conflict resolution skills.  Therapeutic Goals: Patient will identify two positive emotions or experiences to reflect on in order to balance out negative emotions:  Patient will label two or more emotions that they find the most difficult to experience:  Patient will be able to demonstrate positive conflict resolution skills through discussion or role plays:   Summary of Patient Progress: Patient was present for the entirety of group. He reported feeling empty prior to admission and acknowledged that it can be dangerous for him because he does not care about anything at that point. He stated that watching tv is a positive activity that helps him manage negative emotions. Pt appeared receptive to information and feedback presented.  Therapeutic Modalities:   Cognitive Behavioral Therapy Feelings Identification Dialectical Behavioral Therapy   [name], LCSW

## 2021-10-05 DIAGNOSIS — F3163 Bipolar disorder, current episode mixed, severe, without psychotic features: Secondary | ICD-10-CM | POA: Diagnosis not present

## 2021-10-05 MED ORDER — QUETIAPINE FUMARATE 200 MG PO TABS
200.0000 mg | ORAL_TABLET | Freq: Every day | ORAL | Status: DC
  Administered 2021-10-05 – 2021-10-06 (×2): 200 mg via ORAL
  Filled 2021-10-05 (×2): qty 1

## 2021-10-05 NOTE — Progress Notes (Signed)
Recreation Therapy Notes  Date: 10/05/2021  Time: 10:30 am    Location: Craft room  Behavioral response: Appropriate  Intervention Topic: Communication   Discussion/Intervention:  Group content today was focused on communication. The group defined communication and ways to communicate with others. Individuals stated reason why communication is important and some reasons to communicate with others. Patients expressed if they thought they were good at communicating with others and ways they could improve their communication skills. The group identified important parts of communication and some experiences they have had in the past with communication. The group participated in the intervention "What is that?", where they had a chance to test out their communication skills and identify ways to improve their communication techniques.  Clinical Observations/Feedback: Patient came to group late and stated that he sometimes gets in his own head and over thinks while communicating with others. Individual was social with staff while participating in the intervention.  Jovon Streetman LRT/CTRS         Syann Cupples 10/05/2021 12:18 PM

## 2021-10-05 NOTE — Progress Notes (Signed)
D: Pt alert and oriented. Pt rates depression 5/10, hopelessness 0/10, and anxiety 5/10. Pt goal: "Find something to do." Pt reports energy level as high and concentration as being good. Pt reports sleep last night as being good. Pt did receive medications for sleep and did find them helpful. Pt denies experiencing any pain at this time. Pt denies experiencing any SI/HI, or AVH at this time.   Pt calm and cooperative. Pt shares readiness and anxiety in regards to going to new treatment facility once discharged. Pt share hope to get back on track with taking medications regularly.   A: Scheduled medications administered to pt, per MD orders. Support and encouragement provided. Frequent verbal contact made. Routine safety checks conducted q15 minutes.   R: No adverse drug reactions noted. Pt verbally contracts for safety at this time. Pt complaint with medications and treatment plan. Pt interacts well with others on the unit. Pt remains safe at this time. Will continue to monitor.

## 2021-10-05 NOTE — [facility] Counselor (Signed)
CSW assist pt with phone screening with [facility] [phone]). Screening appeared to go well. After it ended, pt stated that he just has a lot going on in his head. CSW inquired about how pt would like to proceed. He expressed interest in the program and stated that CSW could follow up about next steps. No other concerns expressed. Contact ended without incident.   CSW called [facility] to follow up. She stated that everything would be submitted for review and hopefully we would have an answer today. Alaina stated that, in the off chance that the decision is not made, CSW would be connected with her coworker to continue the process tomorrow. CSW agreed. Alaina informed CSW that she would update him regarding any developments. No other concerns expressed. Contact ended without incident.   Christian Bond. Christian Bond, MSW, LCSW, LCAS 10/05/2021 11:04 AM

## 2021-10-05 NOTE — Group Note (Signed)
[facility] LCSW Group Therapy Note   Group Date: 10/05/2021 Start Time: 1300 End Time: 1400   Type of Therapy/Topic:  Group Therapy:  Balance in Life  Participation Level:  Active   Description of Group:    This group will address the concept of balance and how it feels and looks when one is unbalanced. Patients will be encouraged to process areas in their lives that are out of balance, and identify reasons for remaining unbalanced. Facilitators will guide patients utilizing problem- solving interventions to address and correct the stressor making their life unbalanced. Understanding and applying boundaries will be explored and addressed for obtaining  and maintaining a balanced life. Patients will be encouraged to explore ways to assertively make their unbalanced needs known to significant others in their lives, using other group members and facilitator for support and feedback.  Therapeutic Goals: Patient will identify two or more emotions or situations they have that consume much of in their lives. Patient will identify signs/triggers that life has become out of balance:  Patient will identify two ways to set boundaries in order to achieve balance in their lives:  Patient will demonstrate ability to communicate their needs through discussion and/or role plays  Summary of Patient Progress: Patient was present in group. Paitent was an active participant.  Patient shared that when he hears "balance" he thinks "don't hyperfixate on things".  He reports that people can sometimes be a trigger to him when he feels off balance.  He reports that "trauma" is another trigger for feeling off balance.  He reports that he takes hot/cold showers to deescalate when he feels overwhelmed.    Therapeutic Modalities:   Cognitive Behavioral Therapy Solution-Focused Therapy Assertiveness Training   Harden Mo, LCSW

## 2021-10-05 NOTE — [facility] Counselor (Signed)
CSW notified that pt has been accepted at [facility]. Alaina, intake coordinator, states that we just need to administer the PCR Covid test prior to discharge. Alaina and CSW discussed discharge. She shared that after speaking with the parents they have agreed to provide transportation there if pt is discharged this weekend. No other concerns expressed. Contact ended without incident.   CSW notified provider.   CSW called back to verify timeframe that the test needed to take place. Alaina states that it could be given at any time. Contact ended without incident.   Vilma Meckel. Algis Greenhouse, MSW, LCSW, LCAS 10/05/2021 4:20 PM

## 2021-10-05 NOTE — Progress Notes (Signed)
[facility] MD Progress Note  10/05/2021 2:12 PM Christian Bond  MRN:  [id number] Subjective: Follow-up for 20 year old man with a diagnosis of bipolar disorder who presented with agitation and mixed mood symptoms.  Patient has shown improvement since admission.  Tolerating medicine well.  On interview today the patient reports that he is feeling better but still has some kind of abnormal symptoms.  He struggles to describe them.  At one point calls them intrusive thoughts but then says that is not exactly right.  Also admits to sometimes still seeing vague shadows or possible visual hallucinations.  During the interview he glances around the room constantly has very limited attention and concentration and looks nervous in a way that to me suggests ongoing symptoms.  Nevertheless shows good insight.  Taking medication and tolerating it well.  I have been told that he has been [place]. Principal Problem: Bipolar affective disorder, mixed, severe (HCC) Diagnosis: Principal Problem:   Bipolar affective disorder, mixed, severe (HCC) Active Problems:   Obsessive compulsive disorder   Attention deficit hyperactivity disorder (ADHD), combined type   Vitamin D deficiency   Cannabis-induced psychotic disorder (HCC)  Total Time spent with patient: 30 minutes  Past Psychiatric History: See previous notes.  Some history of mood symptoms in the past.  Past history also of ADHD when younger  Past Medical History:  Past Medical History:  Diagnosis Date   ADHD (attention deficit hyperactivity disorder)    Allergy     Past Surgical History:  Procedure Laterality Date   TONSILLECTOMY AND ADENOIDECTOMY     Family History:  Family History  Problem Relation Age of Onset   Colon polyps Mother    Stroke Maternal Uncle    Heart disease Maternal Grandfather    Hyperlipidemia Maternal Grandfather    Stroke Maternal Grandfather     Family Psychiatric  History: None reported Social History:  Social History   Substance and Sexual Activity  Alcohol Use No     Social History   Substance and Sexual Activity  Drug Use Yes   Types: Marijuana    Social History   Socioeconomic History   Marital status: Single    Spouse name: Not on file   Number of children: Not on file   Years of education: Not on file   Highest education level: Not on file  Occupational History   Not on file  Tobacco Use   Smoking status: Never   Smokeless tobacco: Never  Vaping Use   Vaping Use: Some days  Substance and Sexual Activity   Alcohol use: No   Drug use: Yes    Types: Marijuana   Sexual activity: Not on file  Other Topics Concern   Not on file  Social History Narrative   Not on file   Social Determinants of Health   Financial Resource Strain: Not on file  Food Insecurity: Not on file  Transportation Needs: Not on file  Physical Activity: Not on file  Stress: Not on file  Social Connections: Not on file   Additional Social History:                         Sleep: Fair  Appetite:  Fair  Current Medications: Current Facility-Administered Medications  Medication Dose Route Frequency Provider Last Rate Last Admin   acetaminophen (TYLENOL) tablet 650 mg  650 mg Oral Q6H PRN Charm Rings,  NP       alum & mag hydroxide-simeth (MAALOX/MYLANTA) 200-200-20 MG/5ML suspension 30 mL  30 mL Oral Q4H PRN Charm Rings, NP       cholecalciferol (VITAMIN D) tablet 500 Units  500 Units Oral Daily Charm Rings, NP   500 Units at 10/05/21 0459   divalproex (DEPAKOTE) DR tablet 250 mg  250 mg Oral q AM Jesse Sans, MD   250 mg at 10/05/21 9774   divalproex (DEPAKOTE) DR tablet 500 mg  500 mg Oral QHS Jesse Sans, MD       gabapentin (NEURONTIN) capsule 100 mg  100 mg Oral TID Charm Rings, NP   100 mg at 10/05/21 1210   magnesium hydroxide (MILK OF MAGNESIA) suspension 30 mL  30 mL Oral Daily PRN  Charm Rings, NP       nicotine polacrilex (NICORETTE) gum 2 mg  2 mg Oral PRN Charm Rings, NP   2 mg at 10/05/21 1210   QUEtiapine (SEROQUEL) tablet 200 mg  200 mg Oral QHS Teleah Villamar, Jackquline Denmark, MD       traZODone (DESYREL) tablet 100 mg  100 mg Oral QHS PRN Gillermo Murdoch, NP   100 mg at 10/04/21 2200    Lab Results:  Results for orders placed or performed during the hospital encounter of 10/02/21 (from the past 48 hour(s))  Lipid panel     Status: None   Collection Time: 10/04/21  6:40 AM  Result Value Ref Range   Cholesterol 146 0 - 200 mg/dL   Triglycerides 78 <142 mg/dL   HDL 49 >39 mg/dL   Total CHOL/HDL Ratio 3.0 RATIO   VLDL 16 0 - 40 mg/dL   LDL Cholesterol 81 0 - 99 mg/dL    Comment:        Total Cholesterol/HDL:CHD Risk Coronary Heart Disease Risk Table                     Men   Women  1/2 Average Risk   3.4   3.3  Average Risk       5.0   4.4  2 X Average Risk   9.6   7.1  3 X Average Risk  23.4   11.0        Use the calculated Patient Ratio above and the CHD Risk Table to determine the patient's CHD Risk.        ATP III CLASSIFICATION (LDL):  <100     mg/dL   Optimal  532-023  mg/dL   Near or Above                    Optimal  130-159  mg/dL   Borderline  343-568  mg/dL   High  >616     mg/dL   Very High Performed at [facility], [address] [place], [place] [address]   Hemoglobin A1c     Status: None   Collection Time: 10/04/21  6:40 AM  Result Value Ref Range   Hgb A1c MFr Bld 5.3 4.8 - 5.6 %    Comment: (NOTE) Pre diabetes:          5.7%-6.4%  Diabetes:              >6.4%  Glycemic control for   <7.0% adults with diabetes    Mean Plasma Glucose 105.41 mg/dL    Comment: Performed at [facility], [address], [place], [place] [address]  Valproic acid level     Status: Abnormal   Collection Time: 10/04/21  6:40 AM  Result Value Ref Range   Valproic Acid Lvl 127 (H) 50.0 - 100.0 ug/mL    Comment: Performed at  [facility], [address] [place], [place] [address]    Blood Alcohol level:  Lab Results  Component Value Date   [facility] <10 09/29/2021   ETH <10 12/20/2020    Metabolic Disorder Labs: Lab Results  Component Value Date   HGBA1C 5.3 10/04/2021   MPG 105.41 10/04/2021   MPG 96.8 12/22/2020   No results found for: PROLACTIN Lab Results  Component Value Date   CHOL 146 10/04/2021   TRIG 78 10/04/2021   HDL 49 10/04/2021   CHOLHDL 3.0 10/04/2021   VLDL 16 10/04/2021   LDLCALC 81 10/04/2021   LDLCALC 160 (H) 12/22/2020    Physical Findings: AIMS:  , ,  ,  ,    CIWA:    COWS:     Musculoskeletal: Strength & Muscle Tone: within normal limits Gait & Station: normal Patient leans: N/A  Psychiatric Specialty Exam:  Presentation  General Appearance: Fairly Groomed  Eye Contact:Fleeting  Speech:Blocked  Speech Volume:Normal  Handedness:Right   Mood and Affect  Mood:Anxious  Affect:Congruent   Thought Process  Thought Processes:Disorganized  Descriptions of Associations:Intact  Orientation:Full (Time, Place and Person)  Thought Content:Paranoid Ideation  History of Schizophrenia/Schizoaffective disorder:No  Duration of Psychotic Symptoms:Greater than six months  Hallucinations:No data recorded Ideas of Reference:Paranoia  Suicidal Thoughts:No data recorded Homicidal Thoughts:No data recorded  Sensorium  Memory:Immediate Fair; Recent Fair; Remote Fair  Judgment:Impaired  Insight:Lacking   Executive Functions  Concentration:Fair  Attention Span:Fair  Recall:Fair  Fund of Knowledge:Fair  Language:Fair   Psychomotor Activity  Psychomotor Activity:No data recorded  Assets  Assets:Desire for Improvement; Financial Resources/Insurance; Housing; Social Support; Talents/Skills; Vocational/Educational; Transportation; Physical Health   Sleep  Sleep:No data recorded   Physical Exam: Physical Exam Constitutional:       Appearance: Normal appearance.  HENT:     Head: Normocephalic and atraumatic.     Mouth/Throat:     Pharynx: Oropharynx is clear.  Eyes:     Pupils: Pupils are equal, round, and reactive to light.  Cardiovascular:     Rate and Rhythm: Normal rate and regular rhythm.  Pulmonary:     Effort: Pulmonary effort is normal.     Breath sounds: Normal breath sounds.  Abdominal:     General: Abdomen is flat.     Palpations: Abdomen is soft.  Musculoskeletal:        General: Normal range of motion.  Skin:    General: Skin is warm and dry.  Neurological:     General: No focal deficit present.     Mental Status: He is alert. Mental status is at baseline.  Psychiatric:        Attention and Perception: He is inattentive.        Mood and Affect: Mood normal.        Speech: Speech is delayed.        Behavior: Behavior is slowed.        Thought Content: Thought content is paranoid.        Cognition and Memory: Memory is impaired.   Review of Systems  Constitutional: Negative.   HENT: Negative.    Eyes: Negative.   Respiratory: Negative.    Cardiovascular: Negative.   Gastrointestinal: Negative.  Musculoskeletal: Negative.   Skin: Negative.   Neurological: Negative.   Psychiatric/Behavioral:  Positive for hallucinations. Negative for depression, memory loss, substance abuse and suicidal ideas. The patient is nervous/anxious. The patient does not have insomnia.   Blood pressure 107/88, pulse 63, temperature 98.2 F (36.8 C), temperature source Oral, resp. rate 17, height 5\' 10"  (1.778 m), weight 57.2 kg, SpO2 98 %. Body mass index is 18.08 kg/m.   Treatment Plan Summary: Medication management and Plan dose of Depakote was slightly decreased yesterday because of a Depakote blood level of 127 and concern about side effects.  Patient is improved but does not seem to be asymptomatic yet.  I agree with the idea that going to a somewhat longer term psychiatric rehabilitation center would be  useful.  Treatment team will work out the details with the parents for discharge probably this weekend.  No change to medicine for today.  , MD 10/05/2021, 2:12 PM

## 2021-10-05 NOTE — Progress Notes (Signed)
Patient calm and pleasant during assessment denying SI/HI/AVH. Pt observed interacting appropriately with staff and peers on the unit. Pt complaint with medication administration per MD orders. Pt given education, support, and encouragement to be active in his treatment plan. Pt being monitored Q 15 minutes for safety per unit protocol. Pt remains safe on the unit.

## 2021-10-06 ENCOUNTER — Ambulatory Visit: Payer: 59 | Admitting: Urology

## 2021-10-06 DIAGNOSIS — F3163 Bipolar disorder, current episode mixed, severe, without psychotic features: Secondary | ICD-10-CM | POA: Diagnosis not present

## 2021-10-06 LAB — RESP PANEL BY RT-PCR (FLU A&B, COVID) ARPGX2
Influenza A by PCR: NEGATIVE
Influenza B by PCR: NEGATIVE
SARS Coronavirus 2 by RT PCR: NEGATIVE

## 2021-10-06 MED ORDER — GABAPENTIN 100 MG PO CAPS: 100.0000 mg | ORAL_CAPSULE | Freq: Three times a day (TID) | ORAL | 1 refills | Status: DC

## 2021-10-06 MED ORDER — DIVALPROEX SODIUM 500 MG PO DR TAB
500.0000 mg | DELAYED_RELEASE_TABLET | Freq: Every morning | ORAL | 0 refills | Status: DC
  Filled 2021-11-08: qty 30, 30d supply, fill #0

## 2021-10-06 MED ORDER — QUETIAPINE FUMARATE 100 MG PO TABS: 100.0000 mg | ORAL_TABLET | Freq: Once | ORAL | Status: DC

## 2021-10-06 MED ORDER — QUETIAPINE FUMARATE 200 MG PO TABS: 200.0000 mg | ORAL_TABLET | Freq: Every day | ORAL | 0 refills | Status: DC

## 2021-10-06 MED ORDER — FLUTICASONE PROPIONATE 50 MCG/ACT NA SUSP
2.0000 | Freq: Every day | NASAL | 1 refills | Status: DC
Start: 2021-10-06 — End: 2022-02-22

## 2021-10-06 MED ORDER — QUETIAPINE FUMARATE 200 MG PO TABS: 200.0000 mg | ORAL_TABLET | Freq: Every day | ORAL | 1 refills | Status: DC

## 2021-10-06 MED ORDER — DIVALPROEX SODIUM 250 MG PO DR TAB
250.0000 mg | DELAYED_RELEASE_TABLET | Freq: Every morning | ORAL | 0 refills | Status: DC
  Filled 2021-10-27 – 2021-11-08 (×2): qty 30, 30d supply, fill #0

## 2021-10-06 NOTE — Progress Notes (Signed)
Recreation Therapy Notes  INPATIENT RECREATION TR PLAN  Patient Details Name: NICKLOS GAXIOLA MRN: [id number] DOB: March 24, 2001 Today's Date: 10/06/2021  Rec Therapy Plan Is patient appropriate for Therapeutic Recreation?: Yes Treatment times per week: at least 3 Estimated Length of Stay: 5-7 days TR Treatment/Interventions: Group participation (Comment)  Discharge Criteria Pt will be discharged from therapy if:: Discharged Treatment plan/goals/alternatives discussed and agreed upon by:: Patient/family  Discharge Summary Short term goals set: Patient will successfully identify 2 ways of making healthy decisions post d/c within 5 recreation therapy group sessions Short term goals met: Complete Progress toward goals comments: Groups attended Which groups?: Other (Comment), Communication, Leisure education (Time Management, Self-care) Reason goals not met: N/A Therapeutic equipment acquired: N/A Reason patient discharged from therapy: Discharge from hospital Pt/family agrees with progress & goals achieved: Yes Date patient discharged from therapy: 10/06/21   Jahmel Flannagan 10/06/2021, 4:35 PM

## 2021-10-06 NOTE — Group Note (Signed)
[facility] LCSW Group Therapy Note   Group Date: 10/06/2021 Start Time: 1300 End Time: 1340  Type of Therapy and Topic:  Group Therapy:  Feelings around Relapse and Recovery  Participation Level:  Active   Mood:  Description of Group:    Patients in this group will discuss emotions they experience before and after a relapse. They will process how experiencing these feelings, or avoidance of experiencing them, relates to having a relapse. Facilitator will guide patients to explore emotions they have related to recovery. Patients will be encouraged to process which emotions are more powerful. They will be guided to discuss the emotional reaction significant others in their lives may have to patients' relapse or recovery. Patients will be assisted in exploring ways to respond to the emotions of others without this contributing to a relapse.  Therapeutic Goals: Patient will identify two or more emotions that lead to relapse for them:  Patient will identify two emotions that result when they relapse:  Patient will identify two emotions related to recovery:  Patient will demonstrate ability to communicate their needs through discussion and/or role plays.   Summary of Patient Progress: Patient was present for the entirety of group. He was involved in the discussion. He identified depression as something that he is trying to stop/avoid relapsing into along with destroying things. Family identified as triggering for this behavior. Medication and utilizing a therapist were identified as some of the things which may help him avoid relapse. Staying positive and connecting with positive people was something that he wanted to focus on post discharge to help with recovery.  Therapeutic Modalities:   Cognitive Behavioral Therapy Solution-Focused Therapy Assertiveness Training Relapse Prevention Therapy   [name], LCSW

## 2021-10-06 NOTE — Progress Notes (Signed)
[facility] MD Progress Note  10/06/2021 2:56 PM Christian Bond  MRN:  [id number] Subjective: Follow-up for this 20 year old man with recent onset of psychotic symptoms current diagnosis bipolar disorder.  Patient seen.  He continues to report that he is doing better.  He denies any current hallucinations.  Denies suicidal or homicidal ideation.  Nevertheless the patient seems to be preoccupied.  Eye contact is diminished and he still has some thought blocking.  He does get out of his room and is able to be around other people.  Hygiene is fine.  He has been cooperative with medicine.  I spoke with his mother today with his consent.  Family has made an arrangement to get him into a presumably longer term psychiatric rehab facility in [place] with a bed available tomorrow.  Patient stated he felt comfortable with going to this facility. Principal Problem: Bipolar affective disorder, mixed, severe (HCC) Diagnosis: Principal Problem:   Bipolar affective disorder, mixed, severe (HCC) Active Problems:   Obsessive compulsive disorder   Attention deficit hyperactivity disorder (ADHD), combined type   Vitamin D deficiency   Cannabis-induced psychotic disorder (HCC)  Total Time spent with patient: 30 minutes  Past Psychiatric History: Patient has a history of obsessive-compulsive disorder anxiety and mood symptoms but recently seems to have manifested more with psychotic mood disorder.  Past Medical History:  Past Medical History:  Diagnosis Date   ADHD (attention deficit hyperactivity disorder)    Allergy     Past Surgical History:  Procedure Laterality Date   TONSILLECTOMY AND ADENOIDECTOMY     Family History:  Family History  Problem Relation Age of Onset   Colon polyps Mother    Stroke Maternal Uncle    Heart disease Maternal Grandfather    Hyperlipidemia Maternal Grandfather    Stroke Maternal Grandfather    Family Psychiatric  History: See previous Social History:  Social  History   Substance and Sexual Activity  Alcohol Use No     Social History   Substance and Sexual Activity  Drug Use Yes   Types: Marijuana    Social History   Socioeconomic History   Marital status: Single    Spouse name: Not on file   Number of children: Not on file   Years of education: Not on file   Highest education level: Not on file  Occupational History   Not on file  Tobacco Use   Smoking status: Never   Smokeless tobacco: Never  Vaping Use   Vaping Use: Some days  Substance and Sexual Activity   Alcohol use: No   Drug use: Yes    Types: Marijuana   Sexual activity: Not on file  Other Topics Concern   Not on file  Social History Narrative   Not on file   Social Determinants of Health   Financial Resource Strain: Not on file  Food Insecurity: Not on file  Transportation Needs: Not on file  Physical Activity: Not on file  Stress: Not on file  Social Connections: Not on file   Additional Social History:                         Sleep: Fair  Appetite:  Fair  Current Medications: Current Facility-Administered Medications  Medication Dose Route Frequency Provider Last Rate Last Admin   acetaminophen (TYLENOL) tablet 650 mg  650 mg Oral Q6H PRN Charm Rings, NP       alum & mag hydroxide-simeth (MAALOX/MYLANTA)  200-200-20 MG/5ML suspension 30 mL  30 mL Oral Q4H PRN Charm Rings, NP       cholecalciferol (VITAMIN D) tablet 500 Units  500 Units Oral Daily Charm Rings, NP   500 Units at 10/06/21 0820   divalproex (DEPAKOTE) DR tablet 250 mg  250 mg Oral q AM Jesse Sans, MD   250 mg at 10/06/21 0819   divalproex (DEPAKOTE) DR tablet 500 mg  500 mg Oral QHS Jesse Sans, MD   500 mg at 10/05/21 2106   gabapentin (NEURONTIN) capsule 100 mg  100 mg Oral TID Charm Rings, NP   100 mg at 10/06/21 1233   magnesium hydroxide (MILK OF MAGNESIA) suspension 30 mL  30 mL Oral Daily PRN Charm Rings, NP       nicotine polacrilex  (NICORETTE) gum 2 mg  2 mg Oral PRN Charm Rings, NP   2 mg at 10/06/21 1355   QUEtiapine (SEROQUEL) tablet 200 mg  200 mg Oral QHS Klayton Monie T, MD   200 mg at 10/05/21 2106   traZODone (DESYREL) tablet 100 mg  100 mg Oral QHS PRN Gillermo Murdoch, NP   100 mg at 10/05/21 2106    Lab Results: No results found for this or any previous visit (from the past 48 hour(s)).  Blood Alcohol level:  Lab Results  Component Value Date   ETH <10 09/29/2021   ETH <10 12/20/2020    Metabolic Disorder Labs: Lab Results  Component Value Date   HGBA1C 5.3 10/04/2021   MPG 105.41 10/04/2021   MPG 96.8 12/22/2020   No results found for: PROLACTIN Lab Results  Component Value Date   CHOL 146 10/04/2021   TRIG 78 10/04/2021   HDL 49 10/04/2021   CHOLHDL 3.0 10/04/2021   VLDL 16 10/04/2021   LDLCALC 81 10/04/2021   LDLCALC 160 (H) 12/22/2020    Physical Findings: AIMS:  , ,  ,  ,    CIWA:    COWS:     Musculoskeletal: Strength & Muscle Tone: within normal limits Gait & Station: normal Patient leans: N/A  Psychiatric Specialty Exam:  Presentation  General Appearance: Fairly Groomed  Eye Contact:Fleeting  Speech:Blocked  Speech Volume:Normal  Handedness:Right   Mood and Affect  Mood:Anxious  Affect:Congruent   Thought Process  Thought Processes:Disorganized  Descriptions of Associations:Intact  Orientation:Full (Time, Place and Person)  Thought Content:Paranoid Ideation  History of Schizophrenia/Schizoaffective disorder:No  Duration of Psychotic Symptoms:Greater than six months  Hallucinations:No data recorded Ideas of Reference:Paranoia  Suicidal Thoughts:No data recorded Homicidal Thoughts:No data recorded  Sensorium  Memory:Immediate Fair; Recent Fair; Remote Fair  Judgment:Impaired  Insight:Lacking   Executive Functions  Concentration:Fair  Attention Span:Fair  Recall:Fair  Fund of Knowledge:Fair  Language:Fair   Psychomotor  Activity  Psychomotor Activity:No data recorded  Assets  Assets:Desire for Improvement; Financial Resources/Insurance; Housing; Social Support; Talents/Skills; Vocational/Educational; Transportation; Physical Health   Sleep  Sleep:No data recorded   Physical Exam: Physical Exam Constitutional:      Appearance: Normal appearance.  HENT:     Head: Normocephalic and atraumatic.     Mouth/Throat:     Pharynx: Oropharynx is clear.  Eyes:     Pupils: Pupils are equal, round, and reactive to light.  Cardiovascular:     Rate and Rhythm: Normal rate and regular rhythm.  Pulmonary:     Effort: Pulmonary effort is normal.     Breath sounds: Normal breath sounds.  Abdominal:  General: Abdomen is flat.     Palpations: Abdomen is soft.  Musculoskeletal:        General: Normal range of motion.  Skin:    General: Skin is warm and dry.  Neurological:     General: No focal deficit present.     Mental Status: He is alert. Mental status is at baseline.  Psychiatric:        Attention and Perception: Attention normal.        Mood and Affect: Affect is blunt.        Speech: Speech is delayed.        Behavior: Behavior is cooperative.        Thought Content: Thought content normal.        Cognition and Memory: Cognition is impaired.   Review of Systems  Constitutional: Negative.   HENT: Negative.    Eyes: Negative.   Respiratory: Negative.    Cardiovascular: Negative.   Gastrointestinal: Negative.   Musculoskeletal: Negative.   Skin: Negative.   Neurological: Negative.   Psychiatric/Behavioral: Negative.    Blood pressure 125/60, pulse 73, temperature 98.3 F (36.8 C), temperature source Oral, resp. rate 17, height 5\' 10"  (1.778 m), weight 57.2 kg, SpO2 97 %. Body mass index is 18.08 kg/m.   Treatment Plan Summary: Medication management and Plan mother had some reservations about the transfer plan given that the last time they tried this the patient decompensated on the way to  [place].  It sounds like possibly the difference that time is that he had already been home for a little bit and had perhaps been off his medicine.  The plan this time it is for the family to pick him up tomorrow and go directly to [facility].  Spoke with patient about it.  He says he feels comfortable with it.  Does not express a lot of anxiety around it.  I told both the patient and his mother that it would definitely be good for him to get as much treatment and stability as possible early on in the course of illness.  We will go ahead and make arrangements so that everything is ready for him to be discharged tomorrow when his parents pick him up.  [facility], MD 10/06/2021, 2:56 PM

## 2021-10-06 NOTE — BHH Counselor (Signed)
CSW received message from Antonietta Barcelona (admissions coordinator at [phone]) requesting copy of COVID test results. CSW faxed copy of results to secure fax line [phone]). Another copy was also attached to discharge paperwork.   Vilma Meckel. Algis Greenhouse, MSW, LCSW, LCAS 10/06/2021 3:58 PM

## 2021-10-06 NOTE — BHH Counselor (Signed)
CSW spoke with mother, Zenith Lamphier [phone]). Mrs. Signer voiced some anxiety around pt going to [place] due to it being five hours away. She stated that she has been unable to speak with pt about whether he would like to go to [place] or [place]. A[place]ley stated that [place] was so far away. She then shared that maybe he could return home to wait for placement at [facility]. She also stated that she had not heard from the psychiatrist regarding whether pt was ready for discharge. CSW stated that the provider would be notified regarding her concerns and given her information to contact her. She agreed. CSW also explained that pt had not mentioned [facility] as an option when they spoke and that he made it very clear that he would like to go directly from the hospital to another facility. She voiced understanding. No other concerns expressed. Contact ended without incident.   CSW attempted contact with [facility] ([phone]) regarding admission. Unable to make contact but voicemail left with contact information for follow through.   Vilma Meckel. Algis Greenhouse, MSW, LCSW, LCAS 10/06/2021 2:59 PM

## 2021-10-06 NOTE — Plan of Care (Signed)
  Problem: Decision Making Goal: STG - Patient will successfully identify 2 ways of making healthy decisions post d/c within 5 recreation therapy group sessions Description: STG - Patient will successfully identify 2 ways of making healthy decisions post d/c within 5 recreation therapy group sessions Outcome: Completed/Met

## 2021-10-06 NOTE — Plan of Care (Signed)
See progress note Problem: Education: Goal: Knowledge of [facility] Education information/materials will improve Outcome: Progressing Goal: Emotional status will improve Outcome: Progressing Goal: Mental status will improve Outcome: Progressing Goal: Verbalization of understanding the information provided will improve Outcome: Progressing   Problem: Activity: Goal: Interest or engagement in activities will improve Outcome: Progressing Goal: Sleeping patterns will improve Outcome: Progressing   Problem: Coping: Goal: Ability to verbalize frustrations and anger appropriately will improve Outcome: Progressing Goal: Ability to demonstrate self-control will improve Outcome: Progressing   Problem: Health Behavior/Discharge Planning: Goal: Identification of resources available to assist in meeting health care needs will improve Outcome: Progressing Goal: Compliance with treatment plan for underlying cause of condition will improve Outcome: Progressing   Problem: Safety: Goal: Periods of time without injury will increase Outcome: Progressing   Problem: Education: Goal: Ability to state activities that reduce stress will improve Outcome: Progressing   Problem: Coping: Goal: Ability to identify and develop effective coping behavior will improve Outcome: Progressing   Problem: Self-Concept: Goal: Ability to identify factors that promote anxiety will improve Outcome: Progressing Goal: Level of anxiety will decrease Outcome: Progressing Goal: Ability to modify response to factors that promote anxiety will improve Outcome: Progressing

## 2021-10-06 NOTE — Discharge Summary (Signed)
Physician Discharge Summary Note  Patient:  Christian Bond is an 20 y.o., male MRN:  [id number] DOB:  05/12/2001 Patient phone:  [phone] (home)  Patient address:   [address] [place] [address],  Total Time spent with patient: 45 minutes  Date of Admission:  10/02/2021 Date of Discharge: 10/07/2021  Reason for Admission: Patient was admitted through the emergency room where he presented with an episode of agitation and aggression and threatening violence with mood and thought instability.  Principal Problem: Bipolar affective disorder, mixed, severe (HCC) Discharge Diagnoses: Principal Problem:   Bipolar affective disorder, mixed, severe (HCC) Active Problems:   Obsessive compulsive disorder   Attention deficit hyperactivity disorder (ADHD), combined type   Vitamin D deficiency   Cannabis-induced psychotic disorder (HCC)   Past Psychiatric History: Patient has a past history of treatment for anxiety disorders and depression  Past Medical History:  Past Medical History:  Diagnosis Date   ADHD (attention deficit hyperactivity disorder)    Allergy     Past Surgical History:  Procedure Laterality Date   TONSILLECTOMY AND ADENOIDECTOMY     Family History:  Family History  Problem Relation Age of Onset   Colon polyps Mother    Stroke Maternal Uncle    Heart disease Maternal Grandfather    Hyperlipidemia Maternal Grandfather    Stroke Maternal Grandfather    Family Psychiatric  History: See previous Social History:  Social History   Substance and Sexual Activity  Alcohol Use No     Social History   Substance and Sexual Activity  Drug Use Yes   Types: Marijuana    Social History   Socioeconomic History   Marital status: Single    Spouse name: Not on file   Number of children: Not on file   Years of education: Not on file   Highest education level: Not on file  Occupational History   Not on file  Tobacco Use   Smoking status: Never   Smokeless tobacco:  Never  Vaping Use   Vaping Use: Some days  Substance and Sexual Activity   Alcohol use: No   Drug use: Yes    Types: Marijuana   Sexual activity: Not on file  Other Topics Concern   Not on file  Social History Narrative   Not on file   Social Determinants of Health   Financial Resource Strain: Not on file  Food Insecurity: Not on file  Transportation Needs: Not on file  Physical Activity: Not on file  Stress: Not on file  Social Connections: Not on file    Hospital Course: Patient was admitted to the psychiatric unit and maintained on 15-minute checks.  Patient did not display any violent or dangerous behavior in the hospital.  He was cooperative with recommended treatment plan.  Medications were managed with a focus on mood stabilizing medicines.  Patient gradually showed improvement.  At one point he was having a tremor and the Depakote level was checked and was a little high at 127.  The dose was cut back slightly and he is currently not expressing any side effects.  Patient's family have stated a preference for transferring him to a longer term mental health rehabilitation Center the can offer treatment modalities and an experience that is not available at the hospital such as ours.  This would require discharging the patient from the hospital to his parents who are then going to transport him to the facility which she is in [place].  Patient  is aware of the plan and says he feels safe and comfortable with it.  Spoke with his mother today who is comfortable with it as well.  Patient will be given prescriptions for all of his medicines and we will have available some as needed medication for anxiety that he can use prior to discharge.  Physical Findings: AIMS:  , ,  ,  ,    CIWA:    COWS:     Musculoskeletal: Strength & Muscle Tone: within normal limits Gait & Station: normal Patient leans: N/A   Psychiatric Specialty Exam:  Presentation  General Appearance: Fairly  Groomed  Eye Contact:Fleeting  Speech:Blocked  Speech Volume:Normal  Handedness:Right   Mood and Affect  Mood:Anxious  Affect:Congruent   Thought Process  Thought Processes:Disorganized  Descriptions of Associations:Intact  Orientation:Full (Time, Place and Person)  Thought Content:Paranoid Ideation  History of Schizophrenia/Schizoaffective disorder:No  Duration of Psychotic Symptoms:Greater than six months  Hallucinations:No data recorded Ideas of Reference:Paranoia  Suicidal Thoughts:No data recorded Homicidal Thoughts:No data recorded  Sensorium  Memory:Immediate Fair; Recent Fair; Remote Fair  Judgment:Impaired  Insight:Lacking   Executive Functions  Concentration:Fair  Attention Span:Fair  Recall:Fair  Fund of Knowledge:Fair  Language:Fair   Psychomotor Activity  Psychomotor Activity:No data recorded  Assets  Assets:Desire for Improvement; Financial Resources/Insurance; Housing; Social Support; Talents/Skills; Vocational/Educational; Transportation; Physical Health   Sleep  Sleep:No data recorded   Physical Exam: Physical Exam Vitals and nursing note reviewed.  Constitutional:      Appearance: Normal appearance.  HENT:     Head: Normocephalic and atraumatic.     Mouth/Throat:     Pharynx: Oropharynx is clear.  Eyes:     Pupils: Pupils are equal, round, and reactive to light.  Cardiovascular:     Rate and Rhythm: Normal rate and regular rhythm.  Pulmonary:     Effort: Pulmonary effort is normal.     Breath sounds: Normal breath sounds.  Abdominal:     General: Abdomen is flat.     Palpations: Abdomen is soft.  Musculoskeletal:        General: Normal range of motion.  Skin:    General: Skin is warm and dry.  Neurological:     General: No focal deficit present.     Mental Status: He is alert. Mental status is at baseline.  Psychiatric:        Attention and Perception: Attention normal.        Mood and Affect: Mood  normal. Affect is blunt.        Speech: Speech is delayed.        Behavior: Behavior is slowed.        Thought Content: Thought content normal.        Cognition and Memory: Cognition is impaired.   Review of Systems  Constitutional: Negative.   HENT: Negative.    Eyes: Negative.   Respiratory: Negative.    Cardiovascular: Negative.   Gastrointestinal: Negative.   Musculoskeletal: Negative.   Skin: Negative.   Neurological: Negative.   Psychiatric/Behavioral: Negative.    Blood pressure 125/60, pulse 73, temperature 98.3 F (36.8 C), temperature source Oral, resp. rate 17, height 5\' 10"  (1.778 m), weight 57.2 kg, SpO2 97 %. Body mass index is 18.08 kg/m.   Social History   Tobacco Use  Smoking Status Never  Smokeless Tobacco Never   Tobacco Cessation:  A prescription for an FDA-approved tobacco cessation medication was offered at discharge and the patient refused   Blood Alcohol level:  Lab Results  Component Value Date   ETH <10 09/29/2021   ETH <10 12/20/2020    Metabolic Disorder Labs:  Lab Results  Component Value Date   HGBA1C 5.3 10/04/2021   MPG 105.41 10/04/2021   MPG 96.8 12/22/2020   No results found for: PROLACTIN Lab Results  Component Value Date   CHOL 146 10/04/2021   TRIG 78 10/04/2021   HDL 49 10/04/2021   CHOLHDL 3.0 10/04/2021   VLDL 16 10/04/2021   LDLCALC 81 10/04/2021   LDLCALC 160 (H) 12/22/2020    See Psychiatric Specialty Exam and Suicide Risk Assessment completed by Attending Physician prior to discharge.  Discharge destination:  Home  Is patient on multiple antipsychotic therapies at discharge:  No   Has Patient had three or more failed trials of antipsychotic monotherapy by history:  No  Recommended Plan for Multiple Antipsychotic Therapies: NA  Discharge Instructions     Diet - low sodium heart healthy   Complete by: As directed    Increase activity slowly   Complete by: As directed       Allergies as of 10/06/2021        Reactions   Amoxicillin Rash   Erythema multiforma   Penicillins Other (See Comments)   Blisters.        Medication List     TAKE these medications      Indication  divalproex 500 MG DR tablet Commonly known as: DEPAKOTE Take 1 tablet (500 mg total) by mouth at bedtime.  Indication: Manic Phase of Manic-Depression   divalproex 250 MG DR tablet Commonly known as: DEPAKOTE Take 1 tablet (250 mg total) by mouth in the morning. Start taking on: October 07, 2021  Indication: Manic Phase of Manic-Depression   fluticasone 50 MCG/ACT nasal spray Commonly known as: FLONASE Place 2 sprays into both nostrils daily.  Indication: Nonallergic Rhinitis   gabapentin 100 MG capsule Commonly known as: NEURONTIN Take 1 capsule (100 mg total) by mouth 3 (three) times daily.  Indication: anxiety   QUEtiapine 200 MG tablet Commonly known as: SEROQUEL Take 1 tablet (200 mg total) by mouth at bedtime.  Indication: Manic Phase of Manic-Depression        Follow-up Information     [facility] Follow up on 10/07/2021.   Why: Your parents will provide transportation to [facility] on 10/07/21. Contact information: [address] [place], [place] [address] phone: [phone]                Follow-up recommendations: Follow-up with [facility] in [place] followed by follow-up with psychiatric treatment locally in the longer run.  Family and patient aware.  Comments: Prescriptions provided at discharge  Signed: Mordecai Rasmussen, MD 10/06/2021, 3:07 PM

## 2021-10-06 NOTE — Progress Notes (Signed)
Recreation Therapy Notes   Date: 10/06/2021  Time: 10:00 am   Location: Craft room    Behavioral response: Appropriate  Intervention Topic: Time Management   Discussion/Intervention:  Group content today was focused on time management. The group defined time management and identified healthy ways to manage time. Individuals expressed how much of the 24 hours they use in a day. Patients expressed how much time they use just for themselves personally. The group expressed how they have managed their time in the past. Individuals participated in the intervention "Managing Life" where they had a chance to see how much of the 24 hours they use and where it goes. Clinical Observations/Feedback: Patient came to group and stated that sometimes other people can help you manage your time properly by holding you accountable. He stated that he uses 6 out of the 24 hours in a day in a productive manner. Participant expressed that his ADHD normally stop him from managing his time properly. Individual was social with peers and staff while participating in the intervention. Jerrett Baldinger LRT/CTRS     Netra Postlethwait 10/06/2021 10:57 AM

## 2021-10-06 NOTE — BHH Suicide Risk Assessment (Signed)
[facility] Discharge Suicide Risk Assessment   Principal Problem: Bipolar affective disorder, mixed, severe (HCC) Discharge Diagnoses: Principal Problem:   Bipolar affective disorder, mixed, severe (HCC) Active Problems:   Obsessive compulsive disorder   Attention deficit hyperactivity disorder (ADHD), combined type   Vitamin D deficiency   Cannabis-induced psychotic disorder (HCC)   Total Time spent with patient: 30 minutes  Musculoskeletal: Strength & Muscle Tone: within normal limits Gait & Station: normal Patient leans: N/A  Psychiatric Specialty Exam  Presentation  General Appearance: Fairly Groomed  Eye Contact:Fleeting  Speech:Blocked  Speech Volume:Normal  Handedness:Right   Mood and Affect  Mood:Anxious  Duration of Depression Symptoms: Greater than two weeks  Affect:Congruent   Thought Process  Thought Processes:Disorganized  Descriptions of Associations:Intact  Orientation:Full (Time, Place and Person)  Thought Content:Paranoid Ideation  History of Schizophrenia/Schizoaffective disorder:No  Duration of Psychotic Symptoms:Greater than six months  Hallucinations:No data recorded Ideas of Reference:Paranoia  Suicidal Thoughts:No data recorded Homicidal Thoughts:No data recorded  Sensorium  Memory:Immediate Fair; Recent Fair; Remote Fair  Judgment:Impaired  Insight:Lacking   Executive Functions  Concentration:Fair  Attention Span:Fair  Recall:Fair  Fund of Knowledge:Fair  Language:Fair   Psychomotor Activity  Psychomotor Activity:No data recorded  Assets  Assets:Desire for Improvement; Financial Resources/Insurance; Housing; Social Support; Talents/Skills; Vocational/Educational; Transportation; Physical Health   Sleep  Sleep:No data recorded  Physical Exam: Physical Exam Vitals and nursing note reviewed.  Constitutional:      Appearance: Normal appearance.  HENT:     Head: Normocephalic and atraumatic.      Mouth/Throat:     Pharynx: Oropharynx is clear.  Eyes:     Pupils: Pupils are equal, round, and reactive to light.  Cardiovascular:     Rate and Rhythm: Normal rate and regular rhythm.  Pulmonary:     Effort: Pulmonary effort is normal.     Breath sounds: Normal breath sounds.  Abdominal:     General: Abdomen is flat.     Palpations: Abdomen is soft.  Musculoskeletal:        General: Normal range of motion.  Skin:    General: Skin is warm and dry.  Neurological:     General: No focal deficit present.     Mental Status: He is alert. Mental status is at baseline.  Psychiatric:        Attention and Perception: Attention normal. He is attentive.        Mood and Affect: Mood normal. Affect is blunt.        Speech: Speech is delayed.        Behavior: Behavior is slowed.        Thought Content: Thought content normal.        Cognition and Memory: Cognition is impaired.   Review of Systems  Constitutional: Negative.   HENT: Negative.    Eyes: Negative.   Respiratory: Negative.    Cardiovascular: Negative.   Gastrointestinal: Negative.   Musculoskeletal: Negative.   Skin: Negative.   Neurological: Negative.   Psychiatric/Behavioral: Negative.    Blood pressure 125/60, pulse 73, temperature 98.3 F (36.8 C), temperature source Oral, resp. rate 17, height 5\' 10"  (1.778 m), weight 57.2 kg, SpO2 97 %. Body mass index is 18.08 kg/m.  Mental Status Per Nursing Assessment::   On Admission:  NA  Demographic Factors:  Male, Adolescent or young adult, and Caucasian  Loss Factors: NA  Historical Factors: Impulsivity  Risk Reduction Factors:   Sense of responsibility to family, Positive social support, Positive therapeutic relationship,  and Positive coping skills or problem solving skills  Continued Clinical Symptoms:  Bipolar Disorder:   Mixed State  Cognitive Features That Contribute To Risk:  Loss of executive function    Suicide Risk:  Minimal: No identifiable suicidal  ideation.  Patients presenting with no risk factors but with morbid ruminations; may be classified as minimal risk based on the severity of the depressive symptoms   Follow-up Information     [facility] Follow up on 10/07/2021.   Why: Your parents will provide transportation to [facility] on 10/07/21. Contact information: [address] [place], [place] [address] phone: [phone]                Plan Of Care/Follow-up recommendations:  Plan is for patient to be picked up by his parents who will be transporting him to a longer term rehabilitation facility.  Patient is denying any suicidal ideation and has not displayed any dangerous or self harming behavior.  He expresses positive plans for the future.  Prescriptions and medications will be arranged for prior to discharge  Mordecai Rasmussen, MD 10/06/2021, 3:02 PM

## 2021-10-06 NOTE — Progress Notes (Addendum)
Pt visible on the unit, interacting with peers and staff. Attending unit activities and no complaints. He denies SI/HI thoughts. He denies auditory hallucinations, he reports seeing shadows/shapes in his room and moving objections that he can not identify.

## 2021-10-07 NOTE — BHH Counselor (Signed)
  [facility] Adult Case Management Discharge Plan :  Will you be returning to the same living situation after discharge:  Yes,  patient's parents to transport patient to long-term inpatient psychiatric treatment at [facility]. . At discharge, do you have transportation home?: Yes,  patient's parents to provide transportation  Do you have the ability to pay for your medications: Yes,  patient's mother to assist. Patient is insured.  Release of information consent forms completed and in the chart;  Patient's signature needed at discharge.  Patient to Follow up at:  Follow-up Information     [facility] Follow up on 10/07/2021.   Why: Your parents will provide transportation to [facility] on 10/07/21. Contact information: [address] [place], [place] [address] phone: [phone]                Next level of care provider has access to [facility] Link:no  Safety Planning and Suicide Prevention discussed: Yes,  SPE completed with patient's mother.   Has patient been referred to the Quitline?: Patient refused referral  Patient has been referred for addiction treatment: No  Norberto Sorenson, LCSWA 10/07/2021, 9:05 AM

## 2021-10-07 NOTE — Progress Notes (Signed)
D: Pt alert and oriented. Pt rates depression 0/10 and anxiety 4/10. Pt reports experiencing 5/10 anterior headache, prn meds given. Pt denies experiencing any SI/HI, or AVH at this time.   Pt voices hopefulness in regard to discharging to another facility. Pt also voices anxiety as well. Pt is anxious yet calm and cooperative.   A: Scheduled medications administered to pt, per MD orders. Support and encouragement provided. Frequent verbal contact made. Routine safety checks conducted q15 minutes.   R: No adverse drug reactions noted. Pt verbally contracts for safety at this time. Pt complaint with medications and treatment plan. Pt interacts well with others on the unit. Pt remains safe at this time. Will continue to monitor.

## 2021-10-07 NOTE — BHH Suicide Risk Assessment (Signed)
Suicide Risk Assessment  Discharge Assessment    [facility] Discharge Suicide Risk Assessment   Principal Problem: Bipolar affective disorder, mixed, severe (HCC) Discharge Diagnoses: Principal Problem:   Bipolar affective disorder, mixed, severe (HCC) Active Problems:   Obsessive compulsive disorder   Attention deficit hyperactivity disorder (ADHD), combined type   Cannabis-induced psychotic disorder (HCC)   Total Time spent with patient: 20 minutes  Musculoskeletal: Strength & Muscle Tone: within normal limits Gait & Station: normal Patient leans: N/A  Psychiatric Specialty Exam  Presentation  General Appearance: Fairly Groomed  Eye Contact:Fleeting  Speech:Blocked  Speech Volume:Normal  Handedness:Right   Mood and Affect  Mood:Anxious  Duration of Depression Symptoms: Greater than two weeks  Affect:Congruent   Thought Process  Thought Processes:Disorganized  Descriptions of Associations:Intact  Orientation:Full (Time, Place and Person)  Thought Content:Paranoid Ideation  History of Schizophrenia/Schizoaffective disorder:No  Duration of Psychotic Symptoms:Greater than six months  Hallucinations:No data recorded Ideas of Reference:Paranoia  Suicidal Thoughts:No data recorded Homicidal Thoughts:No data recorded  Sensorium  Memory:Immediate Fair; Recent Fair; Remote Fair  Judgment:Impaired  Insight:Lacking   Executive Functions  Concentration:Fair  Attention Span:Fair  Recall:Fair  Fund of Knowledge:Fair  Language:Fair   Psychomotor Activity  Psychomotor Activity:No data recorded  Assets  Assets:Desire for Improvement; Financial Resources/Insurance; Housing; Social Support; Talents/Skills; Vocational/Educational; Transportation; Physical Health   Sleep  Sleep:No data recorded  Physical Exam: Physical Exam ROS Blood pressure 128/66, pulse 73, temperature 97.7 F (36.5 C), temperature source Oral, resp. rate 17, height 5\' 10"   (1.778 m), weight 57.2 kg, SpO2 100 %. Body mass index is 18.08 kg/m.  Mental Status Per Nursing Assessment::   On Admission:  NA  Demographic Factors:  Male, Adolescent or young adult, and Caucasian  Loss Factors: Decrease in vocational status and NA  Historical Factors: Family history of mental illness or substance abuse and Impulsivity  Risk Reduction Factors:   Sense of responsibility to family, Living with another person, especially a relative, Positive social support, Positive therapeutic relationship, and Positive coping skills or problem solving skills  Continued Clinical Symptoms:  Bipolar Disorder:   Mixed State Previous Psychiatric Diagnoses and Treatments  Cognitive Features That Contribute To Risk:  None    Suicide Risk:  Mild:  Suicidal ideation of limited frequency, intensity, duration, and specificity.  There are no identifiable plans, no associated intent, mild dysphoria and related symptoms, good self-control (both objective and subjective assessment), few other risk factors, and identifiable protective factors, including available and accessible social support.   Follow-up Information     [facility] Follow up on 10/07/2021.   Why: Your parents will provide transportation to [facility] on 10/07/21. Contact information: [address] [place], [place] phone: [phone]                Plan Of Care/Follow-up recommendations:  Activity:  Normal physical activity, exercise Diet:  regular Other:  residential treatment  [phone] 10/07/2021, 9:40 AM

## 2021-10-07 NOTE — Progress Notes (Signed)
BH MD/PA/NP OP Progress Note  10/07/2021 8:46 AM [name]  MRN:  [id number]  Chief Complaint: " I am feeling much better today".  HPI: Patient has been calm, compliant with his medicines and has been taking care of his ADLs. He slept well last night, has good appetite and has been socializing on the floor. He is ready for discharge today and will be going to a Residential Treatment facility in [place]. He reports feeling slightly sad, mainly about the events that lead to his admission here and some things from his past. He is denying current SI/HI or any AVH. He is tolerating his medicines well and plans to continue them. He is looking forwards to DC and going to the facility today.  Visit Diagnosis:    ICD-10-CM   1. Allergic rhinitis, unspecified seasonality, unspecified trigger  J30.9 fluticasone (FLONASE) 50 MCG/ACT nasal spray      Past Psychiatric History: Bipolar D/O ADHD  Past Medical History:  Past Medical History:  Diagnosis Date   ADHD (attention deficit hyperactivity disorder)    Allergy     Past Surgical History:  Procedure Laterality Date   TONSILLECTOMY AND ADENOIDECTOMY      Family Psychiatric History: An aunt has Bipolar D/O  Family History:  Family History  Problem Relation Age of Onset   Colon polyps Mother    Stroke Maternal Uncle    Heart disease Maternal Grandfather    Hyperlipidemia Maternal Grandfather    Stroke Maternal Grandfather     Social History:  Social History   Socioeconomic History   Marital status: Single    Spouse name: Not on file   Number of children: Not on file   Years of education: Not on file   Highest education level: Not on file  Occupational History   Not on file  Tobacco Use   Smoking status: Never   Smokeless tobacco: Never  Vaping Use   Vaping Use: Some days  Substance and Sexual Activity   Alcohol use: No   Drug use: Yes    Types: Marijuana   Sexual activity: Not on file  Other Topics Concern    Not on file  Social History Narrative   Not on file   Social Determinants of Health   Financial Resource Strain: Not on file  Food Insecurity: Not on file  Transportation Needs: Not on file  Physical Activity: Not on file  Stress: Not on file  Social Connections: Not on file    Allergies:  Allergies  Allergen Reactions   Amoxicillin Rash    Erythema multiforma   Penicillins Other (See Comments)    Blisters.    Metabolic Disorder Labs: Lab Results  Component Value Date   HGBA1C 5.3 10/04/2021   MPG 105.41 10/04/2021   MPG 96.8 12/22/2020   No results found for: PROLACTIN Lab Results  Component Value Date   CHOL 146 10/04/2021   TRIG 78 10/04/2021   HDL 49 10/04/2021   CHOLHDL 3.0 10/04/2021   VLDL 16 10/04/2021   LDLCALC 81 10/04/2021   LDLCALC 160 (H) 12/22/2020   Lab Results  Component Value Date   TSH 0.70 11/24/2020    Therapeutic Level Labs: No results found for: LITHIUM Lab Results  Component Value Date   VALPROATE 127 (H) 10/04/2021   No components found for:  CBMZ  Current Medications: Current Facility-Administered Medications  Medication Dose Route Frequency Provider Last Rate Last Admin   acetaminophen (TYLENOL) tablet 650 mg  650 mg  Oral Q6H PRN Charm Rings, NP   650 mg at 10/07/21 0746   alum & mag hydroxide-simeth (MAALOX/MYLANTA) 200-200-20 MG/5ML suspension 30 mL  30 mL Oral Q4H PRN Charm Rings, NP       cholecalciferol (VITAMIN D) tablet 500 Units  500 Units Oral Daily Charm Rings, NP   500 Units at 10/07/21 0746   divalproex (DEPAKOTE) DR tablet 250 mg  250 mg Oral q AM Jesse Sans, MD   250 mg at 10/07/21 0746   divalproex (DEPAKOTE) DR tablet 500 mg  500 mg Oral QHS Jesse Sans, MD   500 mg at 10/06/21 2118   gabapentin (NEURONTIN) capsule 100 mg  100 mg Oral TID Charm Rings, NP   100 mg at 10/07/21 0746   magnesium hydroxide (MILK OF MAGNESIA) suspension 30 mL  30 mL Oral Daily PRN Charm Rings, NP        nicotine polacrilex (NICORETTE) gum 2 mg  2 mg Oral PRN Charm Rings, NP   2 mg at 10/07/21 0746   QUEtiapine (SEROQUEL) tablet 100 mg  100 mg Oral Once Clapacs, John T, MD       QUEtiapine (SEROQUEL) tablet 200 mg  200 mg Oral QHS Clapacs, John T, MD   200 mg at 10/06/21 2117   traZODone (DESYREL) tablet 100 mg  100 mg Oral QHS PRN Gillermo Murdoch, NP   100 mg at 10/06/21 2119     Musculoskeletal: Strength & Muscle Tone: within normal limits Gait & Station: normal Patient leans: N/A  Psychiatric Specialty Exam: Review of Systems  Blood pressure 128/66, pulse 73, temperature 97.7 F (36.5 C), temperature source Oral, resp. rate 17, height 5\' 10"  (1.778 m), weight 57.2 kg, SpO2 100 %.Body mass index is 18.08 kg/m.  General Appearance: Neat and Well Groomed  Eye Contact:  Fair  Speech:  Normal Rate  Volume:  Decreased  Mood:  Anxious  Affect:  Appropriate  Thought Process:  Coherent  Orientation:  Full (Time, Place, and Person)  Thought Content: Logical   Suicidal Thoughts:  No  Homicidal Thoughts:  No  Memory:  Immediate;   Good Recent;   Good Remote;   Good  Judgement:  Fair  Insight:  Fair  Psychomotor Activity:  Normal  Concentration:  Concentration: Good and Attention Span: Fair  Recall:  Good  Fund of Knowledge: Good  Language: Good  Akathisia:  Negative  Handed:  Right  AIMS (if indicated): not done  Assets:  Communication Skills Desire for Improvement Financial Resources/Insurance Housing Physical Health Social Support Transportation  ADL's:  Intact  Cognition: WNL  Sleep:  Good   Screenings: AIMS    Flowsheet Row Admission (Discharged) from 12/21/2020 in [facility] INPATIENT BEHAVIORAL MEDICINE  AIMS Total Score 0      AUDIT    Flowsheet Row Admission (Current) from 10/02/2021 in [facility]PATIENT BEHAVIORAL MEDICINE Admission (Discharged) from 12/21/2020 in [facility] INPATIENT BEHAVIORAL MEDICINE  Alcohol Use Disorder Identification Test Final Score  (AUDIT) 2 0      PHQ2-9    Flowsheet Row Office Visit from 08/29/2021 in [facility] Office Visit from 07/22/2020 in [facility] Office Visit from 06/22/2019 in [facility] [place]  PHQ-2 Total Score 0 0 0      Flowsheet Row Admission (Current) from 10/02/2021 in [facility] INPATIENT BEHAVIORAL MEDICINE ED from 09/29/2021 in [facility] Admission (Discharged) from 12/21/2020 in [facility] INPATIENT BEHAVIORAL  MEDICINE  C-SSRS RISK CATEGORY No Risk No Risk Error: Q3, 4, or 5 should not be populated when Q2 is No        Assessment and Plan: 20 Y/O SCM with Bipolar D/O, most recent episode mixed.  He is currently on Depakote, Seroquel and Gabapentin and is stable mostly. He is denying current SI/HI or any AVH. He is going to a residential treatment facility and is looking forwards to it. He is being discharged today.    Christian Bond 10/07/2021, 8:46 AM

## 2021-10-07 NOTE — Progress Notes (Signed)
Patient alert and oriented x 4 affect is blunted, thoughts are organized and coherent, no distress noted, he denies SI/HI/AVH interacting appropriately with peers and staff. 15 minutes safety checks maintained will continue to monitor.

## 2021-10-07 NOTE — Progress Notes (Signed)
D: Pt alert and oriented. Pt denies experiencing any pain, SI/HI, or AVH at this time. Pt reports he will be able to keep himself safe when he returns home.   A: Pt received discharge and medication education/information. Pt belongings were returned and signed for at this time.   R: Pt verbalized understanding of discharge and medication education/information.  Pt escorted by staff to medical mall front lobby where pt's parent picked him up.

## 2021-10-09 DIAGNOSIS — F319 Bipolar disorder, unspecified: Secondary | ICD-10-CM | POA: Diagnosis not present

## 2021-10-12 DIAGNOSIS — F39 Unspecified mood [affective] disorder: Secondary | ICD-10-CM | POA: Diagnosis not present

## 2021-10-26 ENCOUNTER — Other Ambulatory Visit: Payer: Self-pay

## 2021-10-26 ENCOUNTER — Other Ambulatory Visit: Payer: Self-pay | Admitting: Family Medicine

## 2021-10-26 MED ORDER — GABAPENTIN 100 MG PO CAPS
100.0000 mg | ORAL_CAPSULE | Freq: Three times a day (TID) | ORAL | 2 refills | Status: DC
Start: 2021-10-26 — End: 2022-01-17
  Filled 2021-10-26: qty 90, 30d supply, fill #0
  Filled 2021-11-08: qty 90, 30d supply, fill #1

## 2021-10-26 MED ORDER — GABAPENTIN 100 MG PO CAPS
100.0000 mg | ORAL_CAPSULE | Freq: Three times a day (TID) | ORAL | 1 refills | Status: DC
  Filled 2021-10-26 (×2): qty 90, 30d supply, fill #0
  Filled ????-??-??: fill #0

## 2021-10-27 ENCOUNTER — Other Ambulatory Visit: Payer: Self-pay

## 2021-11-01 ENCOUNTER — Other Ambulatory Visit: Payer: Self-pay

## 2021-11-01 DIAGNOSIS — F411 Generalized anxiety disorder: Secondary | ICD-10-CM | POA: Diagnosis not present

## 2021-11-01 MED ORDER — ESCITALOPRAM OXALATE 20 MG PO TABS
ORAL_TABLET | ORAL | 2 refills | Status: DC
  Filled 2021-11-01 – 2021-11-08 (×2): qty 30, 30d supply, fill #0

## 2021-11-02 DIAGNOSIS — F411 Generalized anxiety disorder: Secondary | ICD-10-CM | POA: Diagnosis not present

## 2021-11-03 DIAGNOSIS — F411 Generalized anxiety disorder: Secondary | ICD-10-CM | POA: Diagnosis not present

## 2021-11-06 DIAGNOSIS — F411 Generalized anxiety disorder: Secondary | ICD-10-CM | POA: Diagnosis not present

## 2021-11-07 DIAGNOSIS — F411 Generalized anxiety disorder: Secondary | ICD-10-CM | POA: Diagnosis not present

## 2021-11-08 ENCOUNTER — Other Ambulatory Visit: Payer: Self-pay

## 2021-11-08 DIAGNOSIS — F411 Generalized anxiety disorder: Secondary | ICD-10-CM | POA: Diagnosis not present

## 2021-11-09 DIAGNOSIS — F411 Generalized anxiety disorder: Secondary | ICD-10-CM | POA: Diagnosis not present

## 2021-11-10 ENCOUNTER — Other Ambulatory Visit: Payer: Self-pay

## 2021-11-10 DIAGNOSIS — F411 Generalized anxiety disorder: Secondary | ICD-10-CM | POA: Diagnosis not present

## 2021-11-13 DIAGNOSIS — F411 Generalized anxiety disorder: Secondary | ICD-10-CM | POA: Diagnosis not present

## 2021-11-14 DIAGNOSIS — F411 Generalized anxiety disorder: Secondary | ICD-10-CM | POA: Diagnosis not present

## 2021-11-15 ENCOUNTER — Other Ambulatory Visit: Payer: Self-pay

## 2021-11-15 DIAGNOSIS — F411 Generalized anxiety disorder: Secondary | ICD-10-CM | POA: Diagnosis not present

## 2021-11-15 MED ORDER — QUETIAPINE FUMARATE ER 300 MG PO TB24
ORAL_TABLET | ORAL | 2 refills | Status: DC
  Filled 2021-11-15: qty 30, 30d supply, fill #0

## 2021-11-16 DIAGNOSIS — F411 Generalized anxiety disorder: Secondary | ICD-10-CM | POA: Diagnosis not present

## 2021-11-17 DIAGNOSIS — F411 Generalized anxiety disorder: Secondary | ICD-10-CM | POA: Diagnosis not present

## 2021-11-22 DIAGNOSIS — F411 Generalized anxiety disorder: Secondary | ICD-10-CM | POA: Diagnosis not present

## 2021-11-23 DIAGNOSIS — F411 Generalized anxiety disorder: Secondary | ICD-10-CM | POA: Diagnosis not present

## 2021-11-24 DIAGNOSIS — F411 Generalized anxiety disorder: Secondary | ICD-10-CM | POA: Diagnosis not present

## 2021-11-28 DIAGNOSIS — F411 Generalized anxiety disorder: Secondary | ICD-10-CM | POA: Diagnosis not present

## 2021-11-29 DIAGNOSIS — F411 Generalized anxiety disorder: Secondary | ICD-10-CM | POA: Diagnosis not present

## 2021-11-30 ENCOUNTER — Other Ambulatory Visit: Payer: Self-pay

## 2021-11-30 DIAGNOSIS — F411 Generalized anxiety disorder: Secondary | ICD-10-CM | POA: Diagnosis not present

## 2021-11-30 MED ORDER — HYDROXYZINE HCL 25 MG PO TABS
25.0000 mg | ORAL_TABLET | Freq: Three times a day (TID) | ORAL | 3 refills | Status: DC | PRN
  Filled 2021-11-30: qty 90, 30d supply, fill #0

## 2021-11-30 MED ORDER — CYPROHEPTADINE HCL 4 MG PO TABS
4.0000 mg | ORAL_TABLET | Freq: Three times a day (TID) | ORAL | 3 refills | Status: DC
  Filled 2021-11-30: qty 90, 30d supply, fill #0

## 2021-11-30 MED ORDER — GABAPENTIN 100 MG PO CAPS
100.0000 mg | ORAL_CAPSULE | Freq: Three times a day (TID) | ORAL | 3 refills | Status: DC
  Filled 2021-11-30: qty 90, 30d supply, fill #0

## 2021-11-30 MED ORDER — QUETIAPINE FUMARATE ER 300 MG PO TB24
300.0000 mg | ORAL_TABLET | Freq: Every day | ORAL | 3 refills | Status: DC
  Filled 2021-11-30: qty 30, 30d supply, fill #0

## 2021-11-30 MED ORDER — DULOXETINE HCL 20 MG PO CPEP
20.0000 mg | ORAL_CAPSULE | Freq: Every day | ORAL | 3 refills | Status: DC
  Filled 2021-11-30: qty 30, 30d supply, fill #0

## 2021-11-30 MED ORDER — DIVALPROEX SODIUM 250 MG PO DR TAB
DELAYED_RELEASE_TABLET | ORAL | 3 refills | Status: DC
  Filled 2021-11-30: qty 90, 30d supply, fill #0

## 2021-12-01 ENCOUNTER — Other Ambulatory Visit: Payer: Self-pay

## 2021-12-01 DIAGNOSIS — F411 Generalized anxiety disorder: Secondary | ICD-10-CM | POA: Diagnosis not present

## 2021-12-05 DIAGNOSIS — F411 Generalized anxiety disorder: Secondary | ICD-10-CM | POA: Diagnosis not present

## 2021-12-06 DIAGNOSIS — F411 Generalized anxiety disorder: Secondary | ICD-10-CM | POA: Diagnosis not present

## 2021-12-07 DIAGNOSIS — F411 Generalized anxiety disorder: Secondary | ICD-10-CM | POA: Diagnosis not present

## 2021-12-08 DIAGNOSIS — F411 Generalized anxiety disorder: Secondary | ICD-10-CM | POA: Diagnosis not present

## 2021-12-11 DIAGNOSIS — F411 Generalized anxiety disorder: Secondary | ICD-10-CM | POA: Diagnosis not present

## 2021-12-12 ENCOUNTER — Other Ambulatory Visit: Payer: Self-pay

## 2021-12-12 DIAGNOSIS — F411 Generalized anxiety disorder: Secondary | ICD-10-CM | POA: Diagnosis not present

## 2021-12-13 DIAGNOSIS — F411 Generalized anxiety disorder: Secondary | ICD-10-CM | POA: Diagnosis not present

## 2021-12-14 DIAGNOSIS — F411 Generalized anxiety disorder: Secondary | ICD-10-CM | POA: Diagnosis not present

## 2021-12-15 DIAGNOSIS — F411 Generalized anxiety disorder: Secondary | ICD-10-CM | POA: Diagnosis not present

## 2022-01-17 ENCOUNTER — Other Ambulatory Visit: Payer: Self-pay

## 2022-01-17 ENCOUNTER — Ambulatory Visit (INDEPENDENT_AMBULATORY_CARE_PROVIDER_SITE_OTHER): Payer: BC Managed Care – PPO | Admitting: Urology

## 2022-01-17 ENCOUNTER — Encounter: Payer: Self-pay | Admitting: Urology

## 2022-01-17 VITALS — BP 99/55 | HR 59 | Ht 70.0 in | Wt 135.0 lb

## 2022-01-17 DIAGNOSIS — N489 Disorder of penis, unspecified: Secondary | ICD-10-CM

## 2022-01-17 DIAGNOSIS — N4889 Other specified disorders of penis: Secondary | ICD-10-CM

## 2022-01-17 MED ORDER — DIAZEPAM 5 MG PO TABS
5.0000 mg | ORAL_TABLET | Freq: Once | ORAL | 0 refills | Status: DC | PRN
  Filled 2022-01-17: qty 1, 1d supply, fill #0

## 2022-01-17 NOTE — Progress Notes (Signed)
01/17/22 2:48 PM   Christian Bond Jul 01, 2001 [id number]  CC: Dysuria, penile skin bridge, penile cyst  HPI: 21 year old male with extensive psych history and multiple recent hospitalizations who presents for evaluation of the above issues.  He reports at least a few months of some intermittent dysuria, with negative urinalysis and STD testing.  This has improved over the last few weeks he thinks, and seems to be associated with diet drinks and high caffeine beverages.  His primary concern today is a penile skin bridge on the dorsal aspect of the penis that causes some upward curvature and pain with erections, as well as a small 5 mm cyst on the dorsal aspect of the mid shaft of the penis.  He denies any gross hematuria or other urinary complaints.  Urinalysis today is completely benign  PMH: Past Medical History:  Diagnosis Date   ADHD (attention deficit hyperactivity disorder)    Allergy     Surgical History: Past Surgical History:  Procedure Laterality Date   TONSILLECTOMY AND ADENOIDECTOMY       Family History: Family History  Problem Relation Age of Onset   Colon polyps Mother    Stroke Maternal Uncle    Heart disease Maternal Grandfather    Hyperlipidemia Maternal Grandfather    Stroke Maternal Grandfather     Social History:  reports that he has never smoked. He has never been exposed to tobacco smoke. He has never used smokeless tobacco. He reports current drug use. Drug: Marijuana. He reports that he does not drink alcohol.  Physical Exam: BP (!) 99/55    Pulse (!) 59    Ht 5\' 10"  (1.778 m)    Wt 135 lb (61.2 kg)    BMI 19.37 kg/m    Constitutional:  Alert and oriented, No acute distress. Cardiovascular: No clubbing, cyanosis, or edema. Respiratory: Normal respiratory effort, no increased work of breathing. GI: Abdomen is soft, nontender, nondistended, no abdominal masses GU: Circumcised phallus with patent meatus, 5 mm dorsal skin bridge, 5 mm benign and  mobile nontender epidermal cyst on the dorsal aspect of the penis  Laboratory Data: Reviewed  Assessment & Plan:   21 year old male with some mild intermittent dysuria and multiple benign urinalyses, as well as a skin bridge on the dorsal aspect of the penis as well as a benign-appearing epidermal cyst.  Regarding his urinary symptoms, these seem to be associated with high caffeine in diet beverage intake, and I recommended avoiding those and sticking to more water.  We also discussed considering cystoscopy if he had worsening of his dysuria, but he would like to hold off at this time.  With his benign urinalysis and mild symptoms, urethral stricture would be very unlikely.  In terms of his skin bridging and epidermal cyst, we discussed options including observation, repair of the bridging and removal of the cyst in clinic with local anesthetic, versus in the OR with sedation.  He would like to avoid anesthesia if possible, and is amenable to trying this in clinic.  I do think he would tolerate this well with some local anesthetic.  Risk and benefits discussed at length including bleeding, infection, recurrence.  We discussed again that the cyst on the dorsal aspect of the penis is almost certainly benign, and he is leaning toward having this removed at the same time.  Schedule clinic repair of penile skin bridging and excision of small epidermal cyst under local anesthetic Valium prior   Nickolas Madrid, MD 01/17/2022  [facility] [address] [place], [place] [address] [phone]

## 2022-01-17 NOTE — Patient Instructions (Signed)
Certain drinks with high caffeine and diet drinks can cause irritation of the bladder and some burning with urination.  Would recommend trying 1 to 2 weeks of more water to see if this improves some of the urinary symptoms.  You can also take anti-inflammatories like Aleve or ibuprofen to help with any burning.  If you have worsening symptoms, let us know, and we could consider looking in the bladder with a camera in the future if symptoms recur.

## 2022-01-18 LAB — URINALYSIS, COMPLETE
Bilirubin, UA: NEGATIVE
Glucose, UA: NEGATIVE
Leukocytes,UA: NEGATIVE
Nitrite, UA: NEGATIVE
RBC, UA: NEGATIVE
Specific Gravity, UA: 1.02 (ref 1.005–1.030)
Urobilinogen, Ur: 2 mg/dL — ABNORMAL HIGH (ref 0.2–1.0)
pH, UA: 7.5 (ref 5.0–7.5)

## 2022-01-18 LAB — MICROSCOPIC EXAMINATION
Bacteria, UA: NONE SEEN
RBC, Urine: NONE SEEN /hpf (ref 0–2)

## 2022-01-26 ENCOUNTER — Other Ambulatory Visit: Payer: Self-pay

## 2022-01-31 ENCOUNTER — Ambulatory Visit: Payer: BC Managed Care – PPO | Admitting: Urology

## 2022-02-11 ENCOUNTER — Emergency Department (EMERGENCY_DEPARTMENT_HOSPITAL)
Admission: EM | Admit: 2022-02-11 | Discharge: 2022-02-12 | Disposition: A | Discharge status: Other Acute Inpatient Hospital | Payer: BC Managed Care – PPO | Source: Home / Self Care | Attending: Emergency Medicine | Admitting: Emergency Medicine

## 2022-02-11 ENCOUNTER — Other Ambulatory Visit: Payer: Self-pay

## 2022-02-11 DIAGNOSIS — Z20822 Contact with and (suspected) exposure to covid-19: Secondary | ICD-10-CM | POA: Insufficient documentation

## 2022-02-11 DIAGNOSIS — Z79899 Other long term (current) drug therapy: Secondary | ICD-10-CM | POA: Insufficient documentation

## 2022-02-11 DIAGNOSIS — F22 Delusional disorders: Secondary | ICD-10-CM

## 2022-02-11 DIAGNOSIS — F200 Paranoid schizophrenia: Secondary | ICD-10-CM | POA: Insufficient documentation

## 2022-02-11 LAB — COMPREHENSIVE METABOLIC PANEL
ALT: 18 U/L (ref 0–44)
AST: 21 U/L (ref 15–41)
Albumin: 4.8 g/dL (ref 3.5–5.0)
Alkaline Phosphatase: 60 U/L (ref 38–126)
Anion gap: 8 (ref 5–15)
BUN: 7 mg/dL (ref 6–20)
CO2: 25 mmol/L (ref 22–32)
Calcium: 9.4 mg/dL (ref 8.9–10.3)
Chloride: 105 mmol/L (ref 98–111)
Creatinine, Ser: 0.81 mg/dL (ref 0.61–1.24)
GFR, Estimated: >60 mL/min (ref >60)
Glucose, Bld: 104 mg/dL — ABNORMAL HIGH (ref 70–99)
Potassium: 3.8 mmol/L (ref 3.5–5.1)
Sodium: 138 mmol/L (ref 135–145)
Total Bilirubin: 0.6 mg/dL (ref 0.3–1.2)
Total Protein: 7.7 g/dL (ref 6.5–8.1)

## 2022-02-11 LAB — CBC
HCT: 38.8 % — ABNORMAL LOW (ref 39.0–52.0)
Hemoglobin: 13.3 g/dL (ref 13.0–17.0)
MCH: 31.3 pg (ref 26.0–34.0)
MCHC: 34.3 g/dL (ref 30.0–36.0)
MCV: 91.3 fL (ref 80.0–100.0)
Platelets: 349 10*3/uL (ref 150–400)
RBC: 4.25 MIL/uL (ref 4.22–5.81)
RDW: 12.4 % (ref 11.5–15.5)
WBC: 9.5 10*3/uL (ref 4.0–10.5)
nRBC: 0 % (ref 0.0–0.2)

## 2022-02-11 LAB — RESP PANEL BY RT-PCR (FLU A&B, COVID) ARPGX2
Influenza A by PCR: NEGATIVE
Influenza B by PCR: NEGATIVE
SARS Coronavirus 2 by RT PCR: NEGATIVE

## 2022-02-11 LAB — ACETAMINOPHEN LEVEL: Acetaminophen (Tylenol), Serum: <10 ug/mL — ABNORMAL LOW (ref 10–30)

## 2022-02-11 LAB — SALICYLATE LEVEL: Salicylate Lvl: <7 mg/dL — ABNORMAL LOW (ref 7.0–30.0)

## 2022-02-11 LAB — ETHANOL: Alcohol, Ethyl (B): <10 mg/dL (ref <10)

## 2022-02-11 MED ORDER — TRAZODONE HCL 100 MG PO TABS: 50.0000 mg | ORAL_TABLET | Freq: Every evening | ORAL | Status: DC | PRN

## 2022-02-11 MED ORDER — GABAPENTIN 100 MG PO CAPS: 200.0000 mg | ORAL_CAPSULE | Freq: Three times a day (TID) | ORAL | Status: DC | PRN

## 2022-02-11 MED ORDER — PALIPERIDONE ER 3 MG PO TB24
3.0000 mg | ORAL_TABLET | Freq: Every day | ORAL | Status: AC
  Administered 2022-02-11: 3 mg via ORAL
  Filled 2022-02-11: qty 1

## 2022-02-11 MED ORDER — PALIPERIDONE ER 3 MG PO TB24
6.0000 mg | ORAL_TABLET | Freq: Every day | ORAL | Status: DC
  Administered 2022-02-12: 6 mg via ORAL
  Filled 2022-02-11: qty 2

## 2022-02-11 MED ORDER — HYDROXYZINE HCL 25 MG PO TABS: 25.0000 mg | ORAL_TABLET | Freq: Three times a day (TID) | ORAL | Status: DC | PRN

## 2022-02-11 MED ORDER — PALIPERIDONE ER 3 MG PO TB24
3.0000 mg | ORAL_TABLET | Freq: Every day | ORAL | Status: DC
Start: 2022-02-11 — End: 2022-02-11

## 2022-02-11 NOTE — ED Notes (Signed)
Pt went to bathroom but states he forgot to give sample. ?

## 2022-02-11 NOTE — Consult Note (Signed)
[facility] Face-to-Face Psychiatry Consult  ? ?Reason for Consult:  psychosis  ?Referring Physician:  EDP ?Patient Identification: Christian Bond ?MRN:  [id number] ?Principal Diagnosis: Paranoid schizophrenia ([place]) ?Diagnosis:  Principal Problem: ?  Paranoid schizophrenia ([place]) ? ? ?Total Time spent with patient: 1 hour ? ?Subjective:   ?Christian Bond is a 21 y.o. male patient admitted with psychosis. ? ?HPI:  21 yo male presents with psychosis.  When assessed the client and the reason he is here, he responded with "the devil is real.  My eyes are open to the world now."  When asked about the voices he is responding to, her reports they are telling him to run and hide.  He is frequently paranoid.  His first break was in college after his girlfriend broke up with him.  Since then, his breaks have become more severe.  His working diagnosis evolved from anxiety/OCD to bipolar to schizoaffective disorder.  Now, after many assessments and having him in PHP & IOP, he has more symptoms in the paranoid schizophrenia diagnostic criteria.  Family history of maternal great grandmother and great aunt with schizophrenia, his father was adopted with history unknown.  His history of noncompliance is a big issue and prevents him from functioning.  Started Invega 3 mg with the intent to move to [place] prior to discharging from inpatient.  He did agree to take this medication during the assessment and did not deny the voices. ? ?He is having delusions about the devil and people in pursuit of him.  There was a welfare check to his home yesterday as his friend was concerned.  He stopped taking his medications and returned to smoking cannabis.  The residential place he graduated from notified his mother that he did not seem well on their last safety check as he was talking about going to live under a bridge.  Christian Bond does not like his parents to be involved in his care and wants to manage his medications independently.   Unfortunately, this does not work out well as he stops his medications and decompensates.  Prior to stopping his medications recently, he was working at [employer] and functioning well.  This past week, he has been driving constantly and not sleeping, bathing, or eating with paranoia that people are after him.  He threw out his cell phone as he felt he was being tracked.  His hair is unkept with red finger nail polish, identifies as a male, often wears black polish.  Over the past few weeks, he has been playing dark music on the piano, irritable, especially towards his family who try to help him.   ? ?Per mother, Christian Bond, the client has not slept in a week and driving constantly.  He returned one night to their home with a switch blade as he was paranoid people were after him.  Christian Bond has not bathed or eaten for the past week.   He stopped taking his medications a few weeks ago and spiraled downward.  Family tries to intervene with minimal success as he does not allow them to help. ? ?Past Psychiatric History: psychosis ? ?Risk to Self:  yes ?Risk to Others:  none ?Prior Inpatient Therapy:  a few ?Prior Outpatient Therapy:  unsure of his newest place ? ?Past Medical History:  ?Past Medical History:  ?Diagnosis Date  ? ADHD (attention deficit hyperactivity disorder)   ? Allergy   ?  ?Past Surgical History:  ?Procedure Laterality Date  ? TONSILLECTOMY AND ADENOIDECTOMY    ? ?  Family History:  ?Family History  ?Problem Relation Age of Onset  ? Colon polyps Mother   ? Stroke Maternal Uncle   ? Heart disease Maternal Grandfather   ? Hyperlipidemia Maternal Grandfather   ? Stroke Maternal Grandfather   ? ?Family Psychiatric  History: maternal great grandmother and great aunt with schizophrenia ? ?Social History:  ?Social History  ? ?Substance and Sexual Activity  ?Alcohol Use No  ?   ?Social History  ? ?Substance and Sexual Activity  ?Drug Use Yes  ? Types: Marijuana  ?  ?Social History  ? ?Socioeconomic History  ?  Marital status: Single  ?  Spouse name: Not on file  ? Number of children: Not on file  ? Years of education: Not on file  ? Highest education level: Not on file  ?Occupational History  ? Not on file  ?Tobacco Use  ? Smoking status: Never  ?  Passive exposure: Never  ? Smokeless tobacco: Never  ?Vaping Use  ? Vaping Use: Some days  ?Substance and Sexual Activity  ? Alcohol use: No  ? Drug use: Yes  ?  Types: Marijuana  ? Sexual activity: Not on file  ?Other Topics Concern  ? Not on file  ?Social History Narrative  ? Not on file  ? ?Social Determinants of Health  ? ?Financial Resource Strain: Not on file  ?Food Insecurity: Not on file  ?Transportation Needs: Not on file  ?Physical Activity: Not on file  ?Stress: Not on file  ?Social Connections: Not on file  ? ?Additional Social History: ?  ? ?Allergies:   ?Allergies  ?Allergen Reactions  ? Amoxicillin Rash  ?  Erythema multiforma  ? Penicillins Other (See Comments)  ?  Blisters.  ? ? ?Labs:  ?Results for orders placed or performed during the hospital encounter of 02/11/22 (from the past 48 hour(s))  ?Comprehensive metabolic panel     Status: Abnormal  ? Collection Time: 02/11/22 11:15 AM  ?Result Value Ref Range  ? Sodium 138 135 - 145 mmol/L  ? Potassium 3.8 3.5 - 5.1 mmol/L  ? Chloride 105 98 - 111 mmol/L  ? CO2 25 22 - 32 mmol/L  ? Glucose, Bld 104 (H) 70 - 99 mg/dL  ?  Comment: Glucose reference range applies only to samples taken after fasting for at least 8 hours.  ? BUN 7 6 - 20 mg/dL  ? Creatinine, Ser 0.81 0.61 - 1.24 mg/dL  ? Calcium 9.4 8.9 - 10.3 mg/dL  ? Total Protein 7.7 6.5 - 8.1 g/dL  ? Albumin 4.8 3.5 - 5.0 g/dL  ? AST 21 15 - 41 U/L  ? ALT 18 0 - 44 U/L  ? Alkaline Phosphatase 60 38 - 126 U/L  ? Total Bilirubin 0.6 0.3 - 1.2 mg/dL  ? GFR, Estimated >60 >60 mL/min  ?  Comment: (NOTE) ?Calculated using the CKD-EPI Creatinine Equation (2021) ?  ? Anion gap 8 5 - 15  ?  Comment: Performed at [facility], [address] [place], [place]  [address]  ?Ethanol     Status: None  ? Collection Time: 02/11/22 11:15 AM  ?Result Value Ref Range  ? Alcohol, Ethyl (B) <10 <10 mg/dL  ?  Comment: (NOTE) ?Lowest detectable limit for serum alcohol is 10 mg/dL. ? ?For medical purposes only. ?Performed at [facility], [place], ?[place] [address] ?  ?Salicylate level     Status: Abnormal  ? Collection Time: 02/11/22 11:15 AM  ?  Result Value Ref Range  ? Salicylate Lvl [id number] (L) 7.0 - 30.0 mg/dL  ?  Comment: Performed at [facility], [address] [place], [place] [address]  ?Acetaminophen level     Status: Abnormal  ? Collection Time: 02/11/22 11:15 AM  ?Result Value Ref Range  ? Acetaminophen (Tylenol), Serum <10 (L) 10 - 30 ug/mL  ?  Comment: (NOTE) ?Therapeutic concentrations vary significantly. A range of 10-30 ug/mL  ?may be an effective concentration for many patients. However, some  ?are best treated at concentrations outside of this range. ?Acetaminophen concentrations >150 ug/mL at 4 hours after ingestion  ?and >50 ug/mL at 12 hours after ingestion are often associated with  ?toxic reactions. ? ?Performed at [facility], [place], ?[place] [address] ?  ?cbc     Status: Abnormal  ? Collection Time: 02/11/22 11:15 AM  ?Result Value Ref Range  ? WBC 9.5 4.0 - 10.5 K/uL  ? RBC 4.25 4.22 - 5.81 MIL/uL  ? Hemoglobin 13.3 13.0 - 17.0 g/dL  ? HCT 38.8 (L) 39.0 - 52.0 %  ? MCV 91.3 80.0 - 100.0 fL  ? MCH 31.3 26.0 - 34.0 pg  ? MCHC 34.3 30.0 - 36.0 g/dL  ? RDW 12.4 11.5 - 15.5 %  ? Platelets 349 150 - 400 K/uL  ? nRBC 0.0 0.0 - 0.2 %  ?  Comment: Performed at [facility], [address], [place], [place] [address]  ?Resp Panel by RT-PCR (Flu A&B, Covid) Nasopharyngeal Swab     Status: None  ? Collection Time: 02/11/22 11:21 AM  ? Specimen: Nasopharyngeal Swab; Nasopharyngeal(NP) swabs in vial transport medium  ?Result Value Ref Range  ? SARS Coronavirus 2 by RT PCR NEGATIVE NEGATIVE  ?  Comment:  (NOTE) ?SARS-CoV-2 target nucleic acids are NOT DETECTED. ? ?The SARS-CoV-2 RNA is generally detectable in upper respiratory ?specimens during the acute phase of infection. The lowest ?concentration of SARS-CoV-2 v

## 2022-02-11 NOTE — ED Notes (Addendum)
Black tshirt,  ?Black jeans ?Black underwear ?Black socks ?Keychain ?Necklace ?Loose change ?Manson Passey hoodie ?Pt dressed out with 2 staff in room, pt given glasses to wear.  ?Belongings placed in bag and labeled with pt's name ?

## 2022-02-11 NOTE — ED Notes (Signed)
Patient refusing to provide urine collection. This is second time patient has stated "he forgot to use urine cup" when provided specimen cup.  ?

## 2022-02-11 NOTE — ED Notes (Signed)
Pt will be admitted to [facility] tomorrow (3/20).   ?

## 2022-02-11 NOTE — ED Provider Notes (Signed)
? ?[facility] ?Provider Note ? ? Event Date/Time  ? First MD Initiated Contact with Patient 02/11/22 1117   ?  (approximate) ?History  ?Paranoid ? ?HPI ?Christian Bond is a 21 y.o. male with a past medical history of OCD and bipolar disorder with psychotic behavior who presents under involuntary commitment due to paranoid behavior.  Per IVC, they know the patient has schizophrenia and has not been taking his medications.  Patient has thrown his phone away as he believes somebody is following him.  Patient is not sleeping and has been carrying a knife for "protection".  Patient denies that people are following him but is not easily redirectable for an interview at this time. ?Physical Exam  ?Triage Vital Signs: ?ED Triage Vitals  ?Enc Vitals Group  ?   BP 02/11/22 1107 129/90  ?   Pulse Rate 02/11/22 1107 68  ?   Resp 02/11/22 1107 18  ?   Temp 02/11/22 1107 98.4 ?F (36.9 ?C)  ?   Temp Source 02/11/22 1107 Oral  ?   SpO2 02/11/22 1107 96 %  ?   Weight 02/11/22 1108 130 lb (59 kg)  ?   Height 02/11/22 1108 5\' 10"  (1.778 m)  ?   Head Circumference --   ?   Peak Flow --   ?   Pain Score 02/11/22 1107 0  ?   Pain Loc --   ?   Pain Edu? --   ?   Excl. in [place]? --   ? ?Most recent vital signs: ?Vitals:  ? 02/11/22 1107  ?BP: 129/90  ?Pulse: 68  ?Resp: 18  ?Temp: 98.4 ?F (36.9 ?C)  ?SpO2: 96%  ? ?General: Awake, oriented x4. ?CV:  Good peripheral perfusion.  ?Resp:  Normal effort.  ?Abd:  No distention.  ?Other:  Young Caucasian male laying in bed in no distress ?ED Results / Procedures / Treatments  ?Labs ?(all labs ordered are listed, but only abnormal results are displayed) ?Labs Reviewed  ?COMPREHENSIVE METABOLIC PANEL - Abnormal; Notable for the following components:  ?    Result Value  ? Glucose, Bld 104 (*)   ? All other components within normal limits  ?SALICYLATE LEVEL - Abnormal; Notable for the following components:  ? Salicylate Lvl [id number] (*)   ? All other components within normal limits   ?ACETAMINOPHEN LEVEL - Abnormal; Notable for the following components:  ? Acetaminophen (Tylenol), Serum <10 (*)   ? All other components within normal limits  ?CBC - Abnormal; Notable for the following components:  ? HCT 38.8 (*)   ? All other components within normal limits  ?RESP PANEL BY RT-PCR (FLU A&B, COVID) [id number]  ?ETHANOL  ?URINE DRUG SCREEN, QUALITATIVE ([facility] ONLY)  ? ?PROCEDURES: ?Critical Care performed: No ?Procedures ?MEDICATIONS ORDERED IN ED: ?Medications - No data to display ?IMPRESSION / MDM / ASSESSMENT AND PLAN / ED COURSE  ?I reviewed the triage vital signs and the nursing notes. ?             ?               ?The patient is on the cardiac monitor to evaluate for evidence of arrhythmia and/or significant heart rate changes. ?Patient presents under IVC for hallucinations/delusions. Thoughts are disorganized. ?No history of prior suicide attempt, and no SI or HI at this time. ?Clinically w/ no overt toxidrome, low suspicion for ingestion given hx and exam Thoughts unlikely 2/2 anemia, hypothyroidism, infection, or ICH. ?  Patient?s decision making capacity is compromised and they are unable to perform all ADL?s (additionally they are without appropriate caretakers to assist through this deficit). ? ?Consult: Psychiatry to evaluate patient for grave disability ?Disposition: Pending psychiatric evaluation ? ?Care of this patient will be signed out the oncoming physician.  All pertinent patient formation is conveyed and all questions answered.  All further care and disposition decisions will be made by the oncoming physician. ? ?  ?FINAL CLINICAL IMPRESSION(S) / ED DIAGNOSES  ? ?Final diagnoses:  ?Paranoia ([place])  ? ?Rx / DC Orders  ? ?ED Discharge Orders   ? ? None  ? ?  ? ?Note:  This document was prepared using Dragon voice recognition software and may include unintentional dictation errors. ?  ?Naaman Plummer, MD ?02/11/22 1255 ? ?

## 2022-02-11 NOTE — ED Notes (Signed)
Report to include Situation, Background, Assessment, and Recommendations received from Amy RN. Patient alert and oriented, warm and dry, in no acute distress. Patient denies SI, HI. Patient is here for paranoia behaviors and non-compliant with medication . Patient made aware of Q15 minute rounds and security cameras for their safety. Patient instructed to come to me with needs or concerns. ? ?

## 2022-02-11 NOTE — BH Assessment (Signed)
Comprehensive Clinical Assessment (CCA) Note ? ?02/11/2022 ?ROCH DRUIN ?[id number] ? ?Chief Complaint:  ?Chief Complaint  ?Patient presents with  ? Paranoid  ? ?Visit Diagnosis: Paranoid Schizophrenia  ? ?Christian Bond is a 21 year old male who presents to the ER under IVC, due to increase paranoia. Patient states, the devil is real and his eyes are now open. He acknowledged the fact he is hearing voices and they are telling him to run and hide. He has been in the ER for similar presentation and it was believed it was due to substance induced psychosis. However, due to the ongoing presentation, even after he is sober. Alone with other family members who are dx with psychotic d/o, the patient has schizophrenia. ? ?During the interview, the patient was cooperative and pleasant. He would stare off while talking. At one point he laid in his bed with the covers over his head, but continued to engage in the conversation. Patient denies SI/HI. ? ?CCA Screening, Triage and Referral (STR) ? ?Patient Reported Information ?How did you hear about [place]? Family/Friend ? ?What Is the Reason for Your Visit/Call Today? Patient became aggressive at home with family and threatened HI. Also increase of Delta-8 (CBD/THC). ? ?How Long Has This Been Causing You Problems? > than 6 months ? ?What Do You Feel Would Help You the Most Today? Alcohol or Drug Use Treatment; Treatment for Depression or other mood problem ? ? ?Have You Recently Had Any Thoughts About Hurting Yourself? No ? ?Are You Planning to Commit Suicide/Harm Yourself At This time? No ? ? ?Have you Recently Had Thoughts About [place]? No ? ?Are You Planning to Harm Someone at This Time? No ? ?Explanation: No data recorded ? ?Have You Used Any Alcohol or Drugs in the Past 24 Hours? Yes ? ?How Long Ago Did You Use Drugs or Alcohol? No data recorded ?What Did You Use and How Much? THC & CBD ? ? ?Do You Currently Have a Therapist/Psychiatrist? No ? ?Name of  Therapist/Psychiatrist: No data recorded ? ?Have You Been Recently Discharged From Any Office Practice or Programs? No ? ?Explanation of Discharge From Practice/Program: No data recorded ? ?  ?CCA Screening Triage Referral Assessment ?Type of Contact: No data recorded ?Telemedicine Service Delivery:   ?Is this Initial or Reassessment? No data recorded ?Date Telepsych consult ordered in [facility]:  No data recorded ?Time Telepsych consult ordered in CHL:  No data recorded ?Location of Assessment: [facility] ED ? ?Provider Location: [facility] ED ? ? ?Collateral Involvement: No data recorded ? ?Does Patient Have a Stage manager Guardian? No data recorded ?Name and Contact of Legal Guardian: No data recorded ?If Minor and Not Living with Parent(s), Who has Custody? No data recorded ?Is CPS involved or ever been involved? Never ? ?Is APS involved or ever been involved? Never ? ? ?Patient Determined To Be At Risk for Harm To Self or Others Based on Review of Patient Reported Information or Presenting Complaint? No ? ?Method: No data recorded ?Availability of Means: No data recorded ?Intent: No data recorded ?Notification Required: No data recorded ?Additional Information for Danger to Others Potential: No data recorded ?Additional Comments for Danger to Others Potential: No data recorded ?Are There Guns or Other Weapons in [place]? No data recorded ?Types of Guns/Weapons: No data recorded ?Are These Weapons Safely Secured?  No data recorded ?Who Could Verify You Are Able To Have These Secured: No data recorded ?Do You Have any Outstanding Charges, Pending Court Dates, Parole/Probation? No data recorded ?Contacted To Inform of Risk of Harm To Self or Others: No data recorded ? ? ?Does Patient Present under Involuntary Commitment? Yes ? ?IVC Papers Initial File Date: 09/29/21 ? ? ?[place] of Residence: [place] ? ? ?Patient Currently Receiving the Following Services: Not Receiving  Services ? ? ?Determination of Need: Emergent (2 hours) ? ? ?Options For Referral: ED Visit ? ? ? ? ?CCA Biopsychosocial ?Patient Reported Schizophrenia/Schizoaffective Diagnosis in Past: Yes ? ? ?Strengths: Some insight, have a support system and open to treatment. ? ? ?Mental Health Symptoms ?Depression:   ?Difficulty Concentrating; Increase/decrease in appetite; Weight gain/loss; Worthlessness ?  ?Duration of Depressive symptoms:  ?Duration of Depressive Symptoms: Greater than two weeks ?  ?Mania:   ?Change in energy/activity; Increased Energy; Overconfidence; Racing thoughts; Recklessness ?  ?Anxiety:    ?Difficulty concentrating; Fatigue; Restlessness; Tension; Worrying ?  ?Psychosis:   ?Hallucinations ?  ?Duration of Psychotic symptoms:  ?Duration of Psychotic Symptoms: Greater than six months ?  ?Trauma:   ?N/A ?  ?Obsessions:   ?N/A ?  ?Compulsions:   ?N/A ?  ?Inattention:   ?N/A ?  ?Hyperactivity/Impulsivity:   ?N/A ?  ?Oppositional/Defiant Behaviors:   ?N/A ?  ?Emotional Irregularity:   ?N/A ?  ?Other Mood/Personality Symptoms:  No data recorded  ? ?Mental Status Exam ?Appearance and self-care  ?Stature:   ?Average ?  ?Weight:   ?Average weight ?  ?Clothing:   ?Neat/clean; Age-appropriate ?  ?Grooming:   ?Normal ?  ?Cosmetic use:   ?None ?  ?Posture/gait:   ?Normal ?  ?Motor activity:   ?-- (within normal range) ?  ?Sensorium  ?Attention:   ?Normal ?  ?Concentration:   ?Preoccupied; Anxiety interferes; Scattered ?  ?Orientation:   ?X5 ?  ?Recall/memory:   ?Defective in Immediate ?  ?Affect and Mood  ?Affect:   ?Appropriate; Anxious; Constricted ?  ?Mood:   ?Anxious ?  ?Relating  ?Eye contact:   ?Fleeting ?  ?Facial expression:   ?Anxious; Constricted; Responsive ?  ?Attitude toward examiner:   ?Cooperative ?  ?Thought and Language  ?Speech flow:  ?Clear and Coherent ?  ?Thought content:   ?Appropriate to Mood and Circumstances; Suspicious ?  ?Preoccupation:   ?None ?  ?Hallucinations:   ?Auditory ?   ?Organization:  No data recorded  ?Executive Functions  ?Fund of Knowledge:   ?[place] ?  ?Intelligence:   ?Average ?  ?Abstraction:   ?Functional ?  ?Judgement:   ?Impaired ?  ?Reality Testing:   ?Distorted ?  ?Insight:   ?Flashes of insight ?  ?Decision Making:   ?Impulsive ?  ?Social Functioning  ?Social Maturity:   ?Impulsive ?  ?Social Judgement:   ?"Games developer"; Normal ?  ?Stress  ?Stressors:   ?Transitions ?  ?Coping Ability:   ?Exhausted; Overwhelmed ?  ?Skill Deficits:   ?Decision making; Self-control ?  ?Supports:   ?Family; Friends/Service system ?  ? ? ?Religion: ?Religion/Spirituality ?Are You A Religious Person?: No ? ?Leisure/Recreation: ?Leisure / Recreation ?Do You Have Hobbies?: No ? ?Exercise/Diet: ?Exercise/Diet ?Do You Exercise?: No ?Have You Gained or Lost A Significant Amount of Weight in the Past Six Months?: No ?Do You Follow a Special Diet?: No ?Do You Have Any Trouble Sleeping?: Yes ?Explanation of Sleeping Difficulties: Due to the paranoia. ? ? ?CCA Employment/Education ?Employment/Work Situation: ?Employment /  Work Situation ?Employment Situation: Unemployed ?Patient's Job has Been Impacted by Current Illness: No ? ?Education: ?Education ?Is Patient Currently Attending School?: No ?Did You Attend College?: No ?Did You Have An Individualized Education Program (IIEP): No ?Did You Have Any Difficulty At School?: No ?Patient's Education Has Been Impacted by Current Illness: No ? ? ?CCA Family/Childhood History ?Family and Relationship History: ?Family history ?Marital status: Single ?Does patient have children?: No ? ?Childhood History:  ?Childhood History ?By whom was/is the patient raised?: Both parents ?Did patient suffer any verbal/emotional/physical/sexual abuse as a child?: No ?Did patient suffer from severe childhood neglect?: No ?Has patient ever been sexually abused/assaulted/raped as an adolescent or adult?: No ?Was the patient ever a victim of a crime or a disaster?: No ?Witnessed  domestic violence?: No ?Has patient been affected by domestic violence as an adult?: No ? ?Child/Adolescent Assessment: ?  ? ? ?CCA Substance Use ?Alcohol/Drug Use: ?Alcohol / Drug Use ?Pain Medications: See PTA ?Prescriptions: See PTA

## 2022-02-11 NOTE — ED Triage Notes (Signed)
Pt from home, pt has a hx of schizophrenia per mother. Mother states to BPD that he has not been taking his meds, sleeping or eating in the past week. Pt has been carrying knife for protection, pt has been driving even though he has hardly slept per mother. Pt threw away his cell phone because he thinks people are following him per mother. Pt states he does not think people are following him.  ?

## 2022-02-11 NOTE — BH Assessment (Signed)
Per [facility] Assistant Director Lorrie Lemons, RN patient to be admitted to [facility] BMU when bed becomes available, after discharges on Monday 02/12/22.  ?

## 2022-02-11 NOTE — ED Notes (Signed)
PT is sleeping. Lunch left in room on the chair.  ?

## 2022-02-11 NOTE — ED Notes (Signed)
Snack and beverage given. 

## 2022-02-11 NOTE — ED Notes (Signed)
When patient is asked how he is doing he said "he is feeling sick but don't want talk about it."  ?

## 2022-02-12 ENCOUNTER — Inpatient Hospital Stay
Admission: AD | Admit: 2022-02-12 | Discharge: 2022-02-22 | DRG: 885 | Disposition: A | Discharge status: Home / Self Care | Payer: BC Managed Care – PPO | Source: Intra-hospital | Attending: Psychiatry | Admitting: Psychiatry

## 2022-02-12 ENCOUNTER — Other Ambulatory Visit: Payer: Self-pay

## 2022-02-12 ENCOUNTER — Encounter: Payer: Self-pay | Admitting: Psychiatry

## 2022-02-12 DIAGNOSIS — Z8249 Family history of ischemic heart disease and other diseases of the circulatory system: Secondary | ICD-10-CM

## 2022-02-12 DIAGNOSIS — F200 Paranoid schizophrenia: Secondary | ICD-10-CM | POA: Diagnosis not present

## 2022-02-12 DIAGNOSIS — Z79899 Other long term (current) drug therapy: Secondary | ICD-10-CM | POA: Diagnosis not present

## 2022-02-12 DIAGNOSIS — Z8349 Family history of other endocrine, nutritional and metabolic diseases: Secondary | ICD-10-CM | POA: Diagnosis not present

## 2022-02-12 DIAGNOSIS — Z8371 Family history of colonic polyps: Secondary | ICD-10-CM

## 2022-02-12 DIAGNOSIS — F909 Attention-deficit hyperactivity disorder, unspecified type: Secondary | ICD-10-CM | POA: Diagnosis present

## 2022-02-12 DIAGNOSIS — G4700 Insomnia, unspecified: Secondary | ICD-10-CM | POA: Diagnosis present

## 2022-02-12 DIAGNOSIS — Z823 Family history of stroke: Secondary | ICD-10-CM

## 2022-02-12 DIAGNOSIS — Z20822 Contact with and (suspected) exposure to covid-19: Secondary | ICD-10-CM | POA: Diagnosis present

## 2022-02-12 DIAGNOSIS — F29 Unspecified psychosis not due to a substance or known physiological condition: Secondary | ICD-10-CM | POA: Diagnosis present

## 2022-02-12 DIAGNOSIS — F17210 Nicotine dependence, cigarettes, uncomplicated: Secondary | ICD-10-CM | POA: Diagnosis present

## 2022-02-12 DIAGNOSIS — Z880 Allergy status to penicillin: Secondary | ICD-10-CM

## 2022-02-12 LAB — URINE DRUG SCREEN, QUALITATIVE (ARMC ONLY)
Amphetamines, Ur Screen: NOT DETECTED
Barbiturates, Ur Screen: NOT DETECTED
Benzodiazepine, Ur Scrn: NOT DETECTED
Cannabinoid 50 Ng, Ur ~~LOC~~: POSITIVE — AB
Cocaine Metabolite,Ur ~~LOC~~: NOT DETECTED
MDMA (Ecstasy)Ur Screen: NOT DETECTED
Methadone Scn, Ur: NOT DETECTED
Opiate, Ur Screen: NOT DETECTED
Phencyclidine (PCP) Ur S: NOT DETECTED
Tricyclic, Ur Screen: NOT DETECTED

## 2022-02-12 MED ORDER — ACETAMINOPHEN 325 MG PO TABS
650.0000 mg | ORAL_TABLET | Freq: Four times a day (QID) | ORAL | Status: DC | PRN
Start: 2022-02-12 — End: 2022-02-22

## 2022-02-12 MED ORDER — TRAZODONE HCL 50 MG PO TABS
50.0000 mg | ORAL_TABLET | Freq: Every evening | ORAL | Status: DC | PRN
  Administered 2022-02-16 – 2022-02-19 (×3): 50 mg via ORAL
  Filled 2022-02-12 (×5): qty 1

## 2022-02-12 MED ORDER — HYDROXYZINE HCL 25 MG PO TABS
25.0000 mg | ORAL_TABLET | Freq: Three times a day (TID) | ORAL | Status: DC | PRN
  Administered 2022-02-16 – 2022-02-19 (×2): 25 mg via ORAL
  Filled 2022-02-12 (×2): qty 1

## 2022-02-12 MED ORDER — MAGNESIUM HYDROXIDE 400 MG/5ML PO SUSP
30.0000 mL | Freq: Every day | ORAL | Status: DC | PRN
Start: 2022-02-12 — End: 2022-02-22

## 2022-02-12 MED ORDER — GABAPENTIN 100 MG PO CAPS: 200.0000 mg | ORAL_CAPSULE | Freq: Three times a day (TID) | ORAL | Status: DC | PRN

## 2022-02-12 MED ORDER — ALUM & MAG HYDROXIDE-SIMETH 200-200-20 MG/5ML PO SUSP
30.0000 mL | ORAL | Status: DC | PRN
Start: 2022-02-12 — End: 2022-02-22

## 2022-02-12 MED ORDER — PALIPERIDONE ER 3 MG PO TB24
6.0000 mg | ORAL_TABLET | Freq: Every day | ORAL | Status: DC
Start: 2022-02-13 — End: 2022-02-14
  Administered 2022-02-13 – 2022-02-14 (×2): 6 mg via ORAL
  Filled 2022-02-12 (×2): qty 2

## 2022-02-12 NOTE — Tx Team (Signed)
Initial Treatment Plan ?02/12/2022 ?8:50 PM ?Christian Bond ?[id number] ? ? ? ?PATIENT STRESSORS: ?Marital or family conflict   ?Medication change or noncompliance   ?Substance abuse   ? ? ?PATIENT STRENGTHS: ?Ability for insight  ?Motivation for treatment/growth  ?Supportive family/friends  ? ? ?PATIENT IDENTIFIED PROBLEMS: ?Medication non-compliant  ?Anxiety  ?Acute Psychosis  ?  ?  ?  ?  ?  ?  ?  ? ?DISCHARGE CRITERIA:  ?Improved stabilization in mood, thinking, and/or behavior ?Verbal commitment to aftercare and medication compliance ? ?PRELIMINARY DISCHARGE PLAN: ?Outpatient therapy ?Return to previous living arrangement ? ?PATIENT/FAMILY INVOLVEMENT: ?This treatment plan has been presented to and reviewed with the patient, Christian Bond. The patient has been given the opportunity to ask questions and make suggestions. ? ?Elmyra Ricks, RN ?02/12/2022, 8:50 PM ?

## 2022-02-12 NOTE — ED Notes (Signed)
Patient continues to be cooperative and pleasant throughout interactions.  ?

## 2022-02-12 NOTE — ED Provider Notes (Signed)
Emergency Medicine Observation Re-evaluation Note ? ?Christian Bond is a 21 y.o. male, seen on rounds today.  Pt initially presented to the ED for complaints of Paranoid ?Currently, the patient is eating lunch in his room, denies any complaints. ? ?Physical Exam  ?BP 117/65 (BP Location: Left Arm)   Pulse 69   Temp 98 ?F (36.7 ?C) (Oral)   Resp 17   Ht 5\' 10"  (1.778 m)   Wt 59 kg   SpO2 99%   BMI 18.65 kg/m?  ?Physical Exam ? ?Constitutional: Resting comfortably. ?Eyes: Conjunctivae are normal. ?Head: Atraumatic. ?Nose: No congestion/rhinnorhea. ?Mouth/Throat: Mucous membranes are moist. ?Neck: Normal ROM ?Cardiovascular: No cyanosis noted. ?Respiratory: Normal respiratory effort. ?Gastrointestinal: Non-distended. ?Genitourinary: deferred ?Musculoskeletal: No lower extremity tenderness nor edema. ?Neurologic:  Normal speech and language. No gross focal neurologic deficits are appreciated. ?Skin:  Skin is warm, dry and intact. No rash noted. ? ? ?ED Course / MDM  ?EKG:  ? ?I have reviewed the labs performed to date as well as medications administered while in observation.  Recent changes in the last 24 hours include none. ? ?Plan  ?Current plan is for psychiatric admission, pending placement. ?HYMAN CROSSAN is under involuntary commitment. ?  ? ?  ?[name], MD ?02/12/22 1013 ? ?

## 2022-02-12 NOTE — Plan of Care (Signed)
Patient new to the unit tonight, hasn't had time to progress ? ?Problem: Education: ?Goal: Knowledge of [facility] Education information/materials will improve ?Outcome: Not Progressing ?Goal: Emotional status will improve ?Outcome: Not Progressing ?Goal: Mental status will improve ?Outcome: Not Progressing ?Goal: Verbalization of understanding the information provided will improve ?Outcome: Not Progressing ?  ?Problem: Safety: ?Goal: Periods of time without injury will increase ?Outcome: Not Progressing ?  ?

## 2022-02-12 NOTE — Progress Notes (Signed)
Patient admitted from [facility] - ED, report received from [facility], [place]. Pt known by this Clinical research associate from previous admissions. Patient stated that he is here because he has been over thinking. Patient calm and compliant denying SI/HI/AVH. Patient oriented to the unit and his room, given snack. Patient didn't have any medications scheduled tonight and hasn't requested anything PRN. Pt being monitored Q 15 minutes for safety per unit protocol. Pt remains safe on the uinit.  ?

## 2022-02-12 NOTE — ED Notes (Signed)
Report received from Jennifer, RN including SBAR. Patient alert and oriented, warm and dry, and in no acute distress. Patient denies SI, HI, AVH and pain. Patient made aware of Q15 minute rounds and Rover and Officer presence for their safety. Patient instructed to come to this nurse with needs or concerns. 

## 2022-02-12 NOTE — BH Assessment (Signed)
Patient is to be admitted to [facility] BMU today 02/12/22 by Dr. Toni Amend.  ?Attending Physician will be Dr.  Toni Amend .   ?Patient has been assigned to room 309, by [facility] Charge Nurse, Wendall Mola.   ? ?ER staff is aware of the admission: ?Misty Stanley, ER Secretary   ?Dr. Larinda Buttery, ER MD  ?Victorino Dike Patient's Nurse  ?Sue Lush, Patient Access.  ?

## 2022-02-13 DIAGNOSIS — F200 Paranoid schizophrenia: Secondary | ICD-10-CM

## 2022-02-13 NOTE — Plan of Care (Signed)
?  Problem: Education: ?Goal: Knowledge of [facility] Education information/materials will improve ?Outcome: Progressing ?Goal: Verbalization of understanding the information provided will improve ?Outcome: Progressing ?  ?Problem: Safety: ?Goal: Periods of time without injury will increase ?Outcome: Progressing ?  ?Problem: Education: ?Goal: Knowledge of the prescribed therapeutic regimen will improve ?Outcome: Progressing ?  ?Problem: Safety: ?Goal: Ability to remain free from injury will improve ?Outcome: Progressing ?  ?

## 2022-02-13 NOTE — [facility] Suicide Risk Assessment (Cosign Needed)
[facility] INPATIENT:  Family/Significant Other Suicide Prevention Education ? ?Suicide Prevention Education:  ?Patient Refusal for Family/Significant Other Suicide Prevention Education: The patient Christian Bond has refused to provide written consent for family/significant other to be provided Family/Significant Other Suicide Prevention Education during admission and/or prior to discharge.  Physician notified. ?SPE completed with pt, as pt refused to consent to family contact. SPI pamphlet provided to pt and pt was encouraged to share information with support network, ask questions, and talk about any concerns relating to SPE. Pt denies access to guns/firearms and verbalized understanding of information provided. Mobile Crisis information also provided to pt.  ? ?Rosezella [place] ?02/13/2022, 11:04 AM ?

## 2022-02-13 NOTE — Progress Notes (Signed)
Patient denies pain. Patient denies anxiety and depression. Patient denies SI/HI/AVH. Patient observed pacing around the unit. Patient did come outside to the courtyard during the shift. Patient compliant with medication administration. ?Q15 minute safety checks maintained. Patient remains safe on the unit at this time. ?

## 2022-02-13 NOTE — Group Note (Signed)
LCSW Group Therapy Note ? ? ?Group Date: 02/13/2022 ?Start Time: 1300 ?End Time: 1400 ? ? ?Type of Therapy and Topic:  Group Therapy: Boundaries ? ?Participation Level:  Active ? ?Description of Group: ?This group will address the use of boundaries in their personal lives. Patients will explore why boundaries are important, the difference between healthy and unhealthy boundaries, and negative and postive outcomes of different boundaries and will look at how boundaries can be crossed.  Patients will be encouraged to identify current boundaries in their own lives and identify what kind of boundary is being set. Facilitators will guide patients in utilizing problem-solving interventions to address and correct types boundaries being used and to address when no boundary is being used. Understanding and applying boundaries will be explored and addressed for obtaining and maintaining a balanced life. Patients will be encouraged to explore ways to assertively make their boundaries and needs known to significant others in their lives, using other group members and facilitator for role play, support, and feedback. ? ?Therapeutic Goals: ? ?1.  Patient will identify areas in their life where setting clear boundaries could be  used to improve their life.  ?2.  Patient will identify signs/triggers that a boundary is not being respected. ?3.  Patient will identify two ways to set boundaries in order to achieve balance in  their lives: ?4.  Patient will demonstrate ability to communicate their needs and set boundaries  through discussion and/or role plays ? ?Summary of Patient Progress:  Patient was active throughout the session and proved open to feedback from [place] and peers. Patient demonstrated a large amount of insight into the subject matter, was respectful of peers, and was present throughout the entire session. ? ?Therapeutic Modalities:   ?Cognitive Behavioral Therapy ?Solution-Focused Therapy ? ?Karalee Height,  LCSWA ?02/13/2022  2:09 PM   ? ?
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
## 2022-02-13 NOTE — BHH Suicide Risk Assessment (Signed)
[facility] Admission Suicide Risk Assessment ? ? ?Nursing information obtained from:  Patient ?Demographic factors:  NA ?Current Mental Status:  NA ?Loss Factors:  NA ?Historical Factors:  NA ?Risk Reduction Factors:  NA ? ?Total Time spent with patient: 1 hour ?Principal Problem: Paranoid schizophrenia (HCC) ?Diagnosis:  Principal Problem: ?  Paranoid schizophrenia (HCC) ?Active Problems: ?  Psychosis (HCC) ? ?Subjective Data: Patient seen and chart reviewed.  21 year old with a history of psychotic episodes was brought into the emergency room after family became concerned about worsening impulsive dangerous behavior.  Report was that the patient had not been sleeping, he had made comments suggesting being delusional and paranoid, family was concerned about him driving irresponsibly or carrying weapons.  On interview today the patient minimizes or denies most symptoms.  Admits to cannabis use.  Denies suicidal or homicidal ideation.  Behavior here has been slightly withdrawn but not otherwise bizarre. ? ?Continued Clinical Symptoms:  ?Alcohol Use Disorder Identification Test Final Score (AUDIT): 0 ?The "Alcohol Use Disorders Identification Test", Guidelines for Use in Primary Care, Second Edition.  World Science writer [facility]). ?Score between 0-7:  no or low risk or alcohol related problems. ?Score between 8-15:  moderate risk of alcohol related problems. ?Score between 16-19:  high risk of alcohol related problems. ?Score 20 or above:  warrants further diagnostic evaluation for alcohol dependence and treatment. ? ? ?CLINICAL FACTORS:  ? Alcohol/Substance Abuse/Dependencies ?Schizophrenia:   Less than 61 years old ? ? ?Musculoskeletal: ?Strength & Muscle Tone: within normal limits ?Gait & Station: normal ?Patient leans: N/A ? ?Psychiatric Specialty Exam: ? ?Presentation  ?General Appearance: Fairly Groomed ? ?Eye Contact:Fleeting ? ?Speech:Blocked ? ?Speech Volume:Normal ? ?Handedness:Right ? ? ?Mood and Affect   ?Mood:Anxious ? ?Affect:Congruent ? ? ?Thought Process  ?Thought Processes:Disorganized ? ?Descriptions of Associations:Intact ? ?Orientation:Full (Time, Place and Person) ? ?Thought Content:Paranoid Ideation ? ?History of Schizophrenia/Schizoaffective disorder:Yes ? ?Duration of Psychotic Symptoms:Greater than six months ? ?Hallucinations:No data recorded ?Ideas of Reference:Paranoia ? ?Suicidal Thoughts:No data recorded ?Homicidal Thoughts:No data recorded ? ?Sensorium  ?Memory:Immediate Fair; Recent Fair; Remote Fair ? ?Judgment:Impaired ? ?Insight:Lacking ? ? ?Executive Functions  ?Concentration:Fair ? ?Attention Span:Fair ? ?Recall:Fair ? ?Fund of Knowledge:Fair ? ?Language:Fair ? ? ?Psychomotor Activity  ?Psychomotor Activity:No data recorded ? ?Assets  ?Assets:Desire for Improvement; Financial Resources/Insurance; Housing; Social Support; Talents/Skills; Vocational/Educational; Transportation; Physical Health ? ? ?Sleep  ?Sleep:No data recorded ? ? ?Physical Exam: ?Physical Exam ?Vitals and nursing note reviewed.  ?Constitutional:   ?   Appearance: Normal appearance.  ?HENT:  ?   Head: Normocephalic and atraumatic.  ?   Mouth/Throat:  ?   Pharynx: Oropharynx is clear.  ?Eyes:  ?   Pupils: Pupils are equal, round, and reactive to light.  ?Cardiovascular:  ?   Rate and Rhythm: Normal rate and regular rhythm.  ?Pulmonary:  ?   Effort: Pulmonary effort is normal.  ?   Breath sounds: Normal breath sounds.  ?Abdominal:  ?   General: Abdomen is flat.  ?   Palpations: Abdomen is soft.  ?Musculoskeletal:     ?   General: Normal range of motion.  ?Skin: ?   General: Skin is warm and dry.  ?Neurological:  ?   General: No focal deficit present.  ?   Mental Status: He is alert. Mental status is at baseline.  ?Psychiatric:     ?   Attention and Perception: He is inattentive.     ?   Mood and Affect: Mood normal. Affect  is blunt.     ?   Speech: He is noncommunicative. Speech is delayed.     ?   Behavior: Behavior is  slowed.     ?   Cognition and Memory: Memory is impaired.     ?   Judgment: Judgment is impulsive.  ? ?Review of Systems  ?Constitutional: Negative.   ?HENT: Negative.    ?Eyes: Negative.   ?Respiratory: Negative.    ?Cardiovascular: Negative.   ?Gastrointestinal: Negative.   ?Musculoskeletal: Negative.   ?Skin: Negative.   ?Neurological: Negative.   ?Psychiatric/Behavioral:  Positive for memory loss and substance abuse. Negative for depression, hallucinations and suicidal ideas. The patient is not nervous/anxious and does not have insomnia.   ?Blood pressure 132/80, pulse (!) 55, temperature 98.4 ?F (36.9 ?C), temperature source Oral, resp. rate 16, height 5\' 10"  (1.778 m), weight 59 kg, SpO2 100 %. Body mass index is 18.66 kg/m?. ? ? ?COGNITIVE FEATURES THAT CONTRIBUTE TO RISK:  ?Thought constriction (tunnel vision)   ? ?SUICIDE RISK:  ? Minimal: No identifiable suicidal ideation.  Patients presenting with no risk factors but with morbid ruminations; may be classified as minimal risk based on the severity of the depressive symptoms ? ?PLAN OF CARE: Continue medication management for what appears to be a psychotic disorder.  Engage patient in individual and group therapy and daily assessment.  Meet with treatment team tomorrow morning.  Ongoing assessment of dangerousness prior to discharge ? ?I certify that inpatient services furnished can reasonably be expected to improve the patient's condition.  ? ? , MD ?02/13/2022, 1:35 PM ? ?

## 2022-02-13 NOTE — H&P (Signed)
Psychiatric Admission Assessment Adult ? ?Patient Identification: Christian Bond ?MRN:  [id number] ?Date of Evaluation:  02/13/2022 ?Chief Complaint:  Psychosis (HCC) [F29] ?Principal Diagnosis: Paranoid schizophrenia (HCC) ?Diagnosis:  Principal Problem: ?  Paranoid schizophrenia (HCC) ?Active Problems: ?  Psychosis (HCC) ? ?History of Present Illness: Patient seen and chart reviewed.  Patient known from previous encounters.  21 year old man brought to the emergency room after family filed commitment paperwork.  Family was concerned that the patient has become more erratic in his behavior.  He has been off of his medicine and has not been sleeping.  They report that he has been driving constantly and are afraid of him getting into erratic behavior.  He has been carrying a knife around and making paranoid statements.  On interview today the patient minimizes symptoms.  He tells me that what happened was that a policeman pulled him over for speeding and brought him to the hospital.  He could not guess why they would have brought him to the hospital for that.  Patient admits he is not taking medicine for at least several weeks.  He says his mood is been fine.  He claims that he sleeps fine with no difficulty.  Admits to regular marijuana use saying he probably uses about 2 joints of marijuana a day.  Admits that he was carrying a knife at 1 point but denies having any specific paranoia or fear that anyone in particular is going to hurt him and denies suicidal or homicidal thought.  Denies that he is having any hallucinations.  When I mentioned to him the part in the history about him saying that Prudy Feeler was somehow involved with this he told me with a big smile that he worship [place].  Very hard to tell if he was trying to be funny or not.  Patient has not shown any behavior problem so far on the unit.  His father, who is a Programmer, multimedia, has contacted me just to express concern about the patient. ?Associated  Signs/Symptoms: ?Depression Symptoms:  insomnia, ?difficulty concentrating, ?Duration of Depression Symptoms: Greater than two weeks ? ?(Hypo) Manic Symptoms:  Distractibility, ?Impulsivity, ?Labiality of Mood, ?Anxiety Symptoms:  Excessive Worry, ?Psychotic Symptoms:  Delusions, ?Paranoia, ?PTSD Symptoms: ?Negative ?Total Time spent with patient: 1 hour ? ?Past Psychiatric History: Patient has been developing symptoms for about a year.  We last saw him on the inpatient ward in November 2022.  After that hospitalization family had him admitted to a longer term mental health facility in [place] where apparently he stabilized.  After coming back home however he gradually stopped taking medicine and declined.  Diagnosis earlier in the course was less clear and was considered possibly bipolar although father indicates that he has been given to understand since that the patient is probably fitting more of a schizophrenic pattern which does sound like it is true. ? ?Is the patient at risk to self? Yes.    ?Has the patient been a risk to self in the past 6 months? Yes.    ?Has the patient been a risk to self within the distant past? No.  ?Is the patient a risk to others? No.  ?Has the patient been a risk to others in the past 6 months? No.  ?Has the patient been a risk to others within the distant past? No.  ? ?Prior Inpatient Therapy:   ?Prior Outpatient Therapy:   ? ?Alcohol Screening: 1. How often do you have a drink containing alcohol?: Never ?2. How  many drinks containing alcohol do you have on a typical day when you are drinking?: 1 or 2 ?3. How often do you have six or more drinks on one occasion?: Never ?AUDIT-C Score: 0 ?4. How often during the last year have you found that you were not able to stop drinking once you had started?: Never ?5. How often during the last year have you failed to do what was normally expected from you because of drinking?: Never ?6. How often during the last year have you needed a  first drink in the morning to get yourself going after a heavy drinking session?: Never ?7. How often during the last year have you had a feeling of guilt of remorse after drinking?: Never ?8. How often during the last year have you been unable to remember what happened the night before because you had been drinking?: Never ?9. Have you or someone else been injured as a result of your drinking?: No ?10. Has a relative or friend or a doctor or another health worker been concerned about your drinking or suggested you cut down?: No ?Alcohol Use Disorder Identification Test Final Score (AUDIT): 0 ?Substance Abuse History in the last 12 months:  Yes.   ?Consequences of Substance Abuse: ?Hard to be completely specific given that he is also noncompliant with medicine but it seems inevitable that chronic cannabis use is going to worsen a out of control psychotic disorder .  Not to mention put him at risk of legal sanction ?Previous Psychotropic Medications: Yes  ?Psychological Evaluations: Yes  ?Past Medical History:  ?Past Medical History:  ?Diagnosis Date  ? ADHD (attention deficit hyperactivity disorder)   ? Allergy   ?  ?Past Surgical History:  ?Procedure Laterality Date  ? TONSILLECTOMY AND ADENOIDECTOMY    ? ?Family History:  ?Family History  ?Problem Relation Age of Onset  ? Colon polyps Mother   ? Stroke Maternal Uncle   ? Heart disease Maternal Grandfather   ? Hyperlipidemia Maternal Grandfather   ? Stroke Maternal Grandfather   ? ?Family Psychiatric  History: None reported ?Tobacco Screening:   ?Social History:  ?Social History  ? ?Substance and Sexual Activity  ?Alcohol Use No  ?   ?Social History  ? ?Substance and Sexual Activity  ?Drug Use Yes  ? Types: Marijuana  ?  ?Additional Social History: ?  ?   ?  ?  ?  ?  ?  ?  ?  ?  ?  ?  ? ?Allergies:   ?Allergies  ?Allergen Reactions  ? Amoxicillin Rash  ?  Erythema multiforma  ? Penicillins Other (See Comments)  ?  Blisters.  ? ?Lab Results: No results found for  this or any previous visit (from the past 48 hour(s)). ? ?Blood Alcohol level:  ?Lab Results  ?Component Value Date  ? ETH <10 02/11/2022  ? ETH <10 09/29/2021  ? ? ?Metabolic Disorder Labs:  ?Lab Results  ?Component Value Date  ? HGBA1C 5.3 10/04/2021  ? MPG 105.41 10/04/2021  ? MPG 96.8 12/22/2020  ? ?No results found for: PROLACTIN ?Lab Results  ?Component Value Date  ? CHOL 146 10/04/2021  ? TRIG 78 10/04/2021  ? HDL 49 10/04/2021  ? CHOLHDL 3.0 10/04/2021  ? VLDL 16 10/04/2021  ? LDLCALC 81 10/04/2021  ? LDLCALC 160 (H) 12/22/2020  ? ? ?Current Medications: ?Current Facility-Administered Medications  ?Medication Dose Route Frequency Provider Last Rate Last Admin  ? acetaminophen (TYLENOL) tablet 650 mg  650 mg Oral  Q6H PRN Vanetta MuldersBarthold, Louise F, NP      ? alum & mag hydroxide-simeth (MAALOX/MYLANTA) 200-200-20 MG/5ML suspension 30 mL  30 mL Oral Q4H PRN Vanetta MuldersBarthold, Louise F, NP      ? gabapentin (NEURONTIN) capsule 200 mg  200 mg Oral TID PRN Vanetta MuldersBarthold, Louise F, NP      ? hydrOXYzine (ATARAX) tablet 25 mg  25 mg Oral TID PRN Vanetta MuldersBarthold, Louise F, NP      ? magnesium hydroxide (MILK OF MAGNESIA) suspension 30 mL  30 mL Oral Daily PRN Gabriel CirriBarthold, Louise F, NP      ? paliperidone (INVEGA) 24 hr tablet 6 mg  6 mg Oral Daily Gabriel CirriBarthold, Louise F, NP   6 mg at 02/13/22 0815  ? traZODone (DESYREL) tablet 50 mg  50 mg Oral QHS PRN Vanetta MuldersBarthold, Louise F, NP      ? ?PTA Medications: ?Medications Prior to Admission  ?Medication Sig Dispense Refill Last Dose  ? cyproheptadine (PERIACTIN) 4 MG tablet Take 1 tablet (4 mg total) by mouth every 8 (eight) hours. 90 tablet 3   ? diazepam (VALIUM) 5 MG tablet Take 1 tablet (5 mg total) by mouth once as needed for up to 1 dose (take 45 minutes before procedure). (Patient not taking: Reported on 02/11/2022) 1 tablet 0   ? divalproex (DEPAKOTE) 500 MG DR tablet Take 1 tablet (500 mg total) by mouth at bedtime 30 tablet 0   ? DULoxetine (CYMBALTA) 20 MG capsule Take 1 capsule (20 mg total) by mouth  daily. 30 capsule 3   ? escitalopram (LEXAPRO) 20 MG tablet Take 0.5 tablet by mouth for 1 week, then increase to 1 tablet by mouth daily (Patient not taking: Reported on 02/11/2022) 30 tablet 2   ? fluticasone (FLO

## 2022-02-13 NOTE — Progress Notes (Signed)
Patient calm and pleasant during assessment denying SI/HI/AVH. Pt observed interacting appropriately with staff and peers on the unit. Patient didn't have any medications scheduled tonight and hasn't requested anything PRN. Pt being monitored Q 15 minutes for safety per unit protocol. Pt remains safe on the uinit.  ?

## 2022-02-13 NOTE — BHH Counselor (Cosign Needed)
MSW intern discussed aftercare with the patient but patient declined.  ? ?Karalee Height, MSW Intern ?02/13/2022 11:05 AM ? ?

## 2022-02-13 NOTE — Progress Notes (Signed)
Recreation Therapy Notes ? ? ?Date: 02/13/2022 ? ?Time: 10:05 am   ? ?Location: Craft room   ? ?Behavioral response: Appropriate ? ?Intervention Topic:  Problem Solving   ? ?Discussion/Intervention:  ?Group content on today was focused on problem solving. The group described what problem solving is. Patients expressed how problems affect them and how they deal with problems. Individuals identified healthy ways to deal with problems. Patients explained what normally happens to them when they do not deal with problems. The group expressed reoccurring problems for them. The group participated in the intervention ?Ways to Solve problems? where patients were given a chance to explore different ways to solve problems.  ?Clinical Observations/Feedback: ?Patient came to group and was focused on what peers and staff had to say about problem solving. Individual was social with staff and peers while participating in the intervention.  ?Taeko Schaffer LRT/CTRS  ? ? ? ? ? ? ? ?Adie Vilar ?02/13/2022 12:14 PM ?

## 2022-02-14 MED ORDER — BENZTROPINE MESYLATE 1 MG PO TABS
0.5000 mg | ORAL_TABLET | Freq: Two times a day (BID) | ORAL | Status: DC
  Administered 2022-02-14 – 2022-02-22 (×16): 0.5 mg via ORAL
  Filled 2022-02-14 (×16): qty 1

## 2022-02-14 MED ORDER — PALIPERIDONE ER 3 MG PO TB24
9.0000 mg | ORAL_TABLET | Freq: Every day | ORAL | Status: DC
  Administered 2022-02-15 – 2022-02-16 (×2): 9 mg via ORAL
  Filled 2022-02-14 (×2): qty 3

## 2022-02-14 NOTE — BH IP Treatment Plan (Signed)
Interdisciplinary Treatment and Diagnostic Plan Update ? ?02/14/2022 ?Time of Session: 1000 ?Christian Bond ?MRN: [id number] ? ?Principal Diagnosis: Paranoid schizophrenia (HCC) ? ?Secondary Diagnoses: Principal Problem: ?  Paranoid schizophrenia (HCC) ?Active Problems: ?  Psychosis (HCC) ? ? ?Current Medications:  ?Current Facility-Administered Medications  ?Medication Dose Route Frequency Provider Last Rate Last Admin  ? acetaminophen (TYLENOL) tablet 650 mg  650 mg Oral Q6H PRN Vanetta MuldersBarthold, Louise F, NP      ? alum & mag hydroxide-simeth (MAALOX/MYLANTA) 200-200-20 MG/5ML suspension 30 mL  30 mL Oral Q4H PRN Vanetta MuldersBarthold, Louise F, NP      ? gabapentin (NEURONTIN) capsule 200 mg  200 mg Oral TID PRN Vanetta MuldersBarthold, Louise F, NP      ? hydrOXYzine (ATARAX) tablet 25 mg  25 mg Oral TID PRN Vanetta MuldersBarthold, Louise F, NP      ? magnesium hydroxide (MILK OF MAGNESIA) suspension 30 mL  30 mL Oral Daily PRN Gabriel CirriBarthold, Louise F, NP      ? paliperidone (INVEGA) 24 hr tablet 6 mg  6 mg Oral Daily Gabriel CirriBarthold, Louise F, NP   6 mg at 02/14/22 0800  ? traZODone (DESYREL) tablet 50 mg  50 mg Oral QHS PRN Vanetta MuldersBarthold, Louise F, NP      ? ?PTA Medications: ?Medications Prior to Admission  ?Medication Sig Dispense Refill Last Dose  ? cyproheptadine (PERIACTIN) 4 MG tablet Take 1 tablet (4 mg total) by mouth every 8 (eight) hours. 90 tablet 3   ? diazepam (VALIUM) 5 MG tablet Take 1 tablet (5 mg total) by mouth once as needed for up to 1 dose (take 45 minutes before procedure). (Patient not taking: Reported on 02/11/2022) 1 tablet 0   ? divalproex (DEPAKOTE) 500 MG DR tablet Take 1 tablet (500 mg total) by mouth at bedtime 30 tablet 0   ? DULoxetine (CYMBALTA) 20 MG capsule Take 1 capsule (20 mg total) by mouth daily. 30 capsule 3   ? escitalopram (LEXAPRO) 20 MG tablet Take 0.5 tablet by mouth for 1 week, then increase to 1 tablet by mouth daily (Patient not taking: Reported on 02/11/2022) 30 tablet 2   ? fluticasone (FLONASE) 50 MCG/ACT nasal spray Place 2  sprays into both nostrils daily. (Patient not taking: Reported on 02/11/2022) 16 g 1   ? gabapentin (NEURONTIN) 100 MG capsule Take 1 capsule (100 mg total) by mouth 3 (three) times daily. 90 capsule 3   ? hydrOXYzine (ATARAX) 25 MG tablet Take 1 tablet (25 mg total) by mouth 3 (three) times daily as needed for anxiety. 90 tablet 3   ? QUEtiapine (SEROQUEL XR) 300 MG 24 hr tablet Take 1 tablet (300 mg total) by mouth at bedtime. 30 tablet 3   ? ? ?Patient Stressors: Marital or family conflict   ?Medication change or noncompliance   ?Substance abuse   ? ?Patient Strengths: Ability for insight  ?Motivation for treatment/growth  ?Supportive family/friends  ? ?Treatment Modalities: Medication Management, Group therapy, Case management,  ?1 to 1 session with clinician, Psychoeducation, Recreational therapy. ? ? ?Physician Treatment Plan for Primary Diagnosis: Paranoid schizophrenia (HCC) ?Long Term Goal(s): Improvement in symptoms so as ready for discharge  ? ?Short Term Goals: Ability to identify changes in lifestyle to reduce recurrence of condition will improve ?Ability to identify triggers associated with substance abuse/mental health issues will improve ?Ability to verbalize feelings will improve ?Ability to demonstrate self-control will improve ?Ability to identify and develop effective coping behaviors will improve ? ?Medication Management: Evaluate patient's  response, side effects, and tolerance of medication regimen. ? ?Therapeutic Interventions: 1 to 1 sessions, Unit Group sessions and Medication administration. ? ?Evaluation of Outcomes: Progressing ? ?Physician Treatment Plan for Secondary Diagnosis: Principal Problem: ?  Paranoid schizophrenia (HCC) ?Active Problems: ?  Psychosis (HCC) ? ?Long Term Goal(s): Improvement in symptoms so as ready for discharge  ? ?Short Term Goals: Ability to identify changes in lifestyle to reduce recurrence of condition will improve ?Ability to identify triggers associated with  substance abuse/mental health issues will improve ?Ability to verbalize feelings will improve ?Ability to demonstrate self-control will improve ?Ability to identify and develop effective coping behaviors will improve    ? ?Medication Management: Evaluate patient's response, side effects, and tolerance of medication regimen. ? ?Therapeutic Interventions: 1 to 1 sessions, Unit Group sessions and Medication administration. ? ?Evaluation of Outcomes: Progressing ? ? ?RN Treatment Plan for Primary Diagnosis: Paranoid schizophrenia (HCC) ?Long Term Goal(s): Knowledge of disease and therapeutic regimen to maintain health will improve ? ?Short Term Goals: Ability to remain free from injury will improve, Ability to verbalize frustration and anger appropriately will improve, Ability to demonstrate self-control, Ability to participate in decision making will improve, Ability to verbalize feelings will improve, Ability to disclose and discuss suicidal ideas, Ability to identify and develop effective coping behaviors will improve, and Compliance with prescribed medications will improve ? ?Medication Management: RN will administer medications as ordered by provider, will assess and evaluate patient's response and provide education to patient for prescribed medication. RN will report any adverse and/or side effects to prescribing provider. ? ?Therapeutic Interventions: 1 on 1 counseling sessions, Psychoeducation, Medication administration, Evaluate responses to treatment, Monitor vital signs and CBGs as ordered, Perform/monitor CIWA, COWS, AIMS and Fall Risk screenings as ordered, Perform wound care treatments as ordered. ? ?Evaluation of Outcomes: Progressing ? ? ?LCSW Treatment Plan for Primary Diagnosis: Paranoid schizophrenia (HCC) ?Long Term Goal(s): Safe transition to appropriate next level of care at discharge, Engage patient in therapeutic group addressing interpersonal concerns. ? ?Short Term Goals: Engage patient in  aftercare planning with referrals and resources, Increase social support, Increase ability to appropriately verbalize feelings, Increase emotional regulation, Facilitate acceptance of mental health diagnosis and concerns, Facilitate patient progression through stages of change regarding substance use diagnoses and concerns, Identify triggers associated with mental health/substance abuse issues, and Increase skills for wellness and recovery ? ?Therapeutic Interventions: Assess for all discharge needs, 1 to 1 time with Child psychotherapist, Explore available resources and support systems, Assess for adequacy in community support network, Educate family and significant other(s) on suicide prevention, Complete Psychosocial Assessment, Interpersonal group therapy. ? ?Evaluation of Outcomes: Progressing ? ? ?Progress in Treatment: ?Attending groups: Yes. ?Participating in groups: Yes. ?Taking medication as prescribed: Yes. ?Toleration medication: Yes. ?Family/Significant other contact made: No, will contact:  Patient has not provided consent for CSW to reach collateral.  ?Patient understands diagnosis: Yes. ?Discussing patient identified problems/goals with staff: Yes. ?Medical problems stabilized or resolved: Yes. ?Denies suicidal/homicidal ideation: Yes. ?Issues/concerns per patient self-inventory: Yes. ?Other: none ? ?New problem(s) identified: No, Describe:  Patient does not express any additional problems/concerns at this time. Patient continues to experience psychotic features, primarily paranoia. ? ?New Short Term/Long Term Goal(s): Patient to work towards elimination of symptoms of psychosis, medication management for mood stabilization; development of comprehensive mental wellness/sobriety (cannabis) plan. ? ?Patient Goals:  States, "taking some time to decompress, I have some thins I want to do differently with my life style."  ? ?Discharge Plan or  Barriers: No psychosocial barriers identified at this time, patient to  return to place of residence when appropriate for discharge.  ? ?Reason for Continuation of Hospitalization: Delusions  ? ?Estimated Length of Stay: 1-7 days  ? ? ?Scribe for Treatment Team: ?Verdie Shire

## 2022-02-14 NOTE — Progress Notes (Signed)
Recreation Therapy Notes ? ?INPATIENT RECREATION THERAPY ASSESSMENT ? ?Patient Details ?Name: Christian Bond ?MRN: [id number] ?DOB: [birth year] ?Today's Date: 02/14/2022 ?      ?Information Obtained From: ?Patient ? ?Able to Participate in Assessment/Interview: ?Yes ? ?Patient Presentation: ?Responsive ? ?Reason for Admission (Per Patient): ?Active Symptoms ? ?Patient Stressors: ?  ? ?Coping Skills:   ?Music, Deep Breathing, TV ? ?Leisure Interests (2+):  ?Music - Listen (Skateboarding) ? ?Frequency of Recreation/Participation: ?Monthly ? ?Awareness of Community Resources:  ?Yes ? ?Community Resources:  ?[employer] ? ?Current Use: ?Yes ? ?If no, Barriers?: ?  ? ?Expressed Interest in [employer] Information: ?  ? ?[place] of Residence:  ? ? ?Patient Main Form of Transportation: ?Car ? ?Patient Strengths:  ?Smart, good heart ? ?Patient Identified Areas of Improvement:  ?Regroup and focus ? ?Patient Goal for Hospitalization:  ?Decompress and relax ? ?Current SI (including self-harm):  ?No ? ?Current HI:  ?No ? ?Current AVH: ?No ? ?Staff Intervention Plan: ?Group Attendance, Collaborate with Interdisciplinary Treatment Team ? ?Consent to Intern Participation: ?N/A ? ?Youa Deloney ?02/14/2022, 3:28 PM ?

## 2022-02-14 NOTE — Group Note (Signed)
[facility] LCSW Group Therapy Note ? ? ?Group Date: 02/14/2022 ?Start Time: 1315 ?End Time: 1415 ? ? ?Type of Therapy/Topic:  Group Therapy:  Emotion Regulation ? ?Participation Level:  None  ? ?Mood: ? ?Description of Group:   ? The purpose of this group is to assist patients in learning to regulate negative emotions and experience positive emotions. Patients will be guided to discuss ways in which they have been vulnerable to their negative emotions. These vulnerabilities will be juxtaposed with experiences of positive emotions or situations, and patients challenged to use positive emotions to combat negative ones. Special emphasis will be placed on coping with negative emotions in conflict situations, and patients will process healthy conflict resolution skills. ? ?Therapeutic Goals: ?Patient will identify two positive emotions or experiences to reflect on in order to balance out negative emotions:  ?Patient will label two or more emotions that they find the most difficult to experience:  ?Patient will be able to demonstrate positive conflict resolution skills through discussion or role plays:  ? ?Summary of Patient Progress: ?Patient was present at the start of group, however, did not engage in group discussions.  ? ?Therapeutic Modalities:   ?Cognitive Behavioral Therapy ?Feelings Identification ?Dialectical Behavioral Therapy ? ? ?Harden Mo, LCSW ?

## 2022-02-14 NOTE — Plan of Care (Signed)
Patient in & out of his room. Appropriate with staff & peers. Denies SI, HI and AVH. Patient stated that he did not have a great day but it was a good day. Did not take any PRN medicine for anxiety. Appetite and energy level good. ADLs maintained. Support and encouragement given. ?

## 2022-02-14 NOTE — Progress Notes (Signed)
Recreation Therapy Notes ? ?INPATIENT RECREATION TR PLAN ? ?Patient Details ?Name: Christian Bond ?MRN: [id number] ?DOB: 10/30/2001 ?Today's Date: 02/14/2022 ? ?Rec Therapy Plan ?Is patient appropriate for Therapeutic Recreation?: Yes ?Treatment times per week: at least 3 ?Estimated Length of Stay: 5-7 days ?TR Treatment/Interventions: Group participation (Comment) ? ?Discharge Criteria ?Pt will be discharged from therapy if:: Discharged ?Treatment plan/goals/alternatives discussed and agreed upon by:: Patient/family ? ?Discharge Summary ?  ? ? ?Abbigayle Toole ?02/14/2022, 3:29 PM ?

## 2022-02-14 NOTE — Progress Notes (Signed)
Patient calm and pleasant during assessment denying SI/HI/AVH. Pt observed interacting appropriately with staff and peers on the unit. Patient didn't have any medications scheduled tonight and hasn't requested anything PRN. Pt being monitored Q 15 minutes for safety per unit protocol. Pt remains safe on the uinit.  ?

## 2022-02-14 NOTE — Progress Notes (Signed)
[facility] ? ?02/14/2022 4:58 PM ?Christian HurtWilliam D Bond  ?MRN:  [id number] ?Subjective: Follow-up 21 year old man with schizophrenia.  Patient is staying in his room a lot of the time but not always.  Comes out to some groups.  In interview he says he is feeling fine.  Blunted affect.  Not very good insight about his psychosis.  Denies any hallucinations.  Denies suicidal or homicidal thought.  Talks about last night having an episode of feeling tingling all over his body at night which kept him awake. ?Principal Problem: Paranoid schizophrenia (HCC) ?Diagnosis: Principal Problem: ?  Paranoid schizophrenia (HCC) ?Active Problems: ?  Psychosis (HCC) ? ?Total Time spent with patient: 30 minutes ? ?Past Psychiatric History: Past history of what seems to be a worsening psychosis over the past year. ? ?Past Medical History:  ?Past Medical History:  ?Diagnosis Date  ? ADHD (attention deficit hyperactivity disorder)   ? Allergy   ?  ?Past Surgical History:  ?Procedure Laterality Date  ? TONSILLECTOMY AND ADENOIDECTOMY    ? ?Family History:  ?Family History  ?Problem Relation Age of Onset  ? Colon polyps Mother   ? Stroke Maternal Uncle   ? Heart disease Maternal Grandfather   ? Hyperlipidemia Maternal Grandfather   ? Stroke Maternal Grandfather   ? ?Family Psychiatric  History: See previous ?Social History:  ?Social History  ? ?Substance and Sexual Activity  ?Alcohol Use No  ?   ?Social History  ? ?Substance and Sexual Activity  ?Drug Use Yes  ? Types: Marijuana  ?  ?Social History  ? ?Socioeconomic History  ? Marital status: Single  ?  Spouse name: Not on file  ? Number of children: Not on file  ? Years of education: Not on file  ? Highest education level: Not on file  ?Occupational History  ? Not on file  ?Tobacco Use  ? Smoking status: Every Day  ?  Packs/day: 1.00  ?  Types: Cigarettes  ?  Passive exposure: Never  ? Smokeless tobacco: Never  ?Vaping Use  ? Vaping Use: Some days  ?Substance and Sexual Activity  ?  Alcohol use: No  ? Drug use: Yes  ?  Types: Marijuana  ? Sexual activity: Not on file  ?Other Topics Concern  ? Not on file  ?Social History Narrative  ? Not on file  ? ?Social Determinants of Health  ? ?Financial Resource Strain: Not on file  ?Food Insecurity: Not on file  ?Transportation Needs: Not on file  ?Physical Activity: Not on file  ?Stress: Not on file  ?Social Connections: Not on file  ? ?Additional Social History:  ?  ?  ?  ?  ?  ?  ?  ?  ?  ?  ?  ? ?Sleep: Fair ? ?Appetite:  Fair ? ?Current Medications: ?Current Facility-Administered Medications  ?Medication Dose Route Frequency Provider Last Rate Last Admin  ? acetaminophen (TYLENOL) tablet 650 mg  650 mg Oral Q6H PRN Vanetta MuldersBarthold, Louise F, NP      ? alum & mag hydroxide-simeth (MAALOX/MYLANTA) 200-200-20 MG/5ML suspension 30 mL  30 mL Oral Q4H PRN Vanetta MuldersBarthold, Louise F, NP      ? gabapentin (NEURONTIN) capsule 200 mg  200 mg Oral TID PRN Vanetta MuldersBarthold, Louise F, NP      ? hydrOXYzine (ATARAX) tablet 25 mg  25 mg Oral TID PRN Vanetta MuldersBarthold, Louise F, NP      ? magnesium hydroxide (MILK OF MAGNESIA) suspension 30 mL  30 mL  Oral Daily PRN Vanetta Mulders, NP      ? paliperidone (INVEGA) 24 hr tablet 6 mg  6 mg Oral Daily Gabriel Cirri F, NP   6 mg at 02/14/22 0800  ? traZODone (DESYREL) tablet 50 mg  50 mg Oral QHS PRN Vanetta Mulders, NP      ? ? ?Lab Results: No results found for this or any previous visit (from the past 48 hour(s)). ? ?Blood Alcohol level:  ?Lab Results  ?Component Value Date  ? ETH <10 02/11/2022  ? ETH <10 09/29/2021  ? ? ?Metabolic Disorder Labs: ?Lab Results  ?Component Value Date  ? HGBA1C 5.3 10/04/2021  ? MPG 105.41 10/04/2021  ? MPG 96.8 12/22/2020  ? ?No results found for: PROLACTIN ?Lab Results  ?Component Value Date  ? CHOL 146 10/04/2021  ? TRIG 78 10/04/2021  ? HDL 49 10/04/2021  ? CHOLHDL 3.0 10/04/2021  ? VLDL 16 10/04/2021  ? LDLCALC 81 10/04/2021  ? LDLCALC 160 (H) 12/22/2020  ? ? ?Physical Findings: ?AIMS:  , ,  ,  ,     ?CIWA:    ?COWS:    ? ?Musculoskeletal: ?Strength & Muscle Tone: within normal limits ?Gait & Station: normal ?Patient leans: N/A ? ?Psychiatric Specialty Exam: ? ?Presentation  ?General Appearance: Fairly Groomed ? ?Eye Contact:Fleeting ? ?Speech:Blocked ? ?Speech Volume:Normal ? ?Handedness:Right ? ? ?Mood and Affect  ?Mood:Anxious ? ?Affect:Congruent ? ? ?Thought Process  ?Thought Processes:Disorganized ? ?Descriptions of Associations:Intact ? ?Orientation:Full (Time, Place and Person) ? ?Thought Content:Paranoid Ideation ? ?History of Schizophrenia/Schizoaffective disorder:Yes ? ?Duration of Psychotic Symptoms:Greater than six months ? ?Hallucinations:No data recorded ?Ideas of Reference:Paranoia ? ?Suicidal Thoughts:No data recorded ?Homicidal Thoughts:No data recorded ? ?Sensorium  ?Memory:Immediate Fair; Recent Fair; Remote Fair ? ?Judgment:Impaired ? ?Insight:Lacking ? ? ?Executive Functions  ?Concentration:Fair ? ?Attention Span:Fair ? ?Recall:Fair ? ?Fund of Knowledge:Fair ? ?Language:Fair ? ? ?Psychomotor Activity  ?Psychomotor Activity:No data recorded ? ?Assets  ?Assets:Desire for Improvement; Financial Resources/Insurance; Housing; Social Support; Talents/Skills; Vocational/Educational; Transportation; Physical Health ? ? ?Sleep  ?Sleep:No data recorded ? ? ?Physical Exam: ?Physical Exam ?Vitals and nursing note reviewed.  ?Constitutional:   ?   Appearance: Normal appearance.  ?HENT:  ?   Head: Normocephalic and atraumatic.  ?   Mouth/Throat:  ?   Pharynx: Oropharynx is clear.  ?Eyes:  ?   Pupils: Pupils are equal, round, and reactive to light.  ?Cardiovascular:  ?   Rate and Rhythm: Normal rate and regular rhythm.  ?Pulmonary:  ?   Effort: Pulmonary effort is normal.  ?   Breath sounds: Normal breath sounds.  ?Abdominal:  ?   General: Abdomen is flat.  ?   Palpations: Abdomen is soft.  ?Musculoskeletal:     ?   General: Normal range of motion.  ?Skin: ?   General: Skin is warm and dry.   ?Neurological:  ?   General: No focal deficit present.  ?   Mental Status: He is alert. Mental status is at baseline.  ?Psychiatric:     ?   Attention and Perception: He is inattentive.     ?   Mood and Affect: Mood normal. Affect is blunt.     ?   Speech: Speech is delayed.     ?   Behavior: Behavior is slowed.     ?   Thought Content: Thought content normal.  ? ?Review of Systems  ?Constitutional: Negative.   ?HENT: Negative.    ?Eyes: Negative.   ?  Respiratory: Negative.    ?Cardiovascular: Negative.   ?Gastrointestinal: Negative.   ?Musculoskeletal: Negative.   ?Skin: Negative.   ?Neurological: Negative.   ?Psychiatric/Behavioral:  Negative for depression, hallucinations and suicidal ideas. The patient has insomnia. The patient is not nervous/anxious.   ?Blood pressure 102/62, pulse 61, temperature 98.2 ?F (36.8 ?C), temperature source Oral, resp. rate 18, height 5\' 10"  (1.778 m), weight 59 kg, SpO2 100 %. Body mass index is 18.66 kg/m?. ? ? ?Treatment Plan Summary: ?Daily contact with patient to assess and evaluate symptoms and progress in treatment, Medication management, and Plan continue plan with the Invega to try to get him over to long-acting injectable.  Hopefully tomorrow we can convince him to start the long-acting injectable.  I have added a low-dose of Cogentin twice a day for what might possibly be medicine side effect although I doubt that is what we are looking at since he gets his antipsychotic in the morning and he was complaining of side effects at night.  Encourage patient to attend groups, out interact with others. ? ? , MD ?02/14/2022, 4:58 PM ? ?

## 2022-02-14 NOTE — Progress Notes (Signed)
Recreation Therapy Notes ? ?Date: 02/14/2022 ? ?Time: 11:05 am  ? ?Location: Craft room  ? ?Behavioral response: Appropriate ? ?Intervention Topic:  Stress Management   ? ?Discussion/Intervention:  ?Group content on today was focused on stress. The group defined stress and way to cope with stress. Participants expressed how they know when they are stresses out. Individuals described the different ways they have to cope with stress. The group stated reasons why it is important to cope with stress. Patient explained what good stress is and some examples. The group participated in the intervention ?Stress Management?. Individuals were separated into two group and answered questions related to stress.  ?Clinical Observations/Feedback: ?Patient came to group and defined stress as a force and feeling that requires attention. He stated that he manages his stress by taking deep breaths and thinking rationale. Participant rated his stress management skills an 8/10. Individual was social with staff and peers while participating in the intervention.  ?Fritz Cauthon LRT/CTRS  ? ? ? ? ? ? ? ? ? ?Amariya Liskey ?02/14/2022 12:37 PM ?

## 2022-02-15 MED ORDER — LORAZEPAM 2 MG PO TABS
2.0000 mg | ORAL_TABLET | Freq: Four times a day (QID) | ORAL | Status: DC | PRN
  Administered 2022-02-15: 2 mg via ORAL
  Filled 2022-02-15 (×2): qty 1

## 2022-02-15 MED ORDER — HALOPERIDOL 5 MG PO TABS
5.0000 mg | ORAL_TABLET | Freq: Four times a day (QID) | ORAL | Status: DC | PRN
  Administered 2022-02-15 (×2): 5 mg via ORAL
  Filled 2022-02-15 (×3): qty 1

## 2022-02-15 MED ORDER — HALOPERIDOL LACTATE 5 MG/ML IJ SOLN
5.0000 mg | Freq: Four times a day (QID) | INTRAMUSCULAR | Status: DC | PRN
  Administered 2022-02-16: 5 mg via INTRAMUSCULAR
  Filled 2022-02-15: qty 1

## 2022-02-15 MED ORDER — LORAZEPAM 2 MG/ML IJ SOLN
2.0000 mg | Freq: Four times a day (QID) | INTRAMUSCULAR | Status: DC | PRN
  Administered 2022-02-16: 2 mg via INTRAMUSCULAR
  Filled 2022-02-15: qty 1

## 2022-02-15 NOTE — Progress Notes (Signed)
Recreation Therapy Notes ? ?Date: 02/15/2022 ? ?Time: 10:35 am    ? ?Location: Courtyard   ? ?Behavioral response: Inappropriate, redirection need ? ?Intervention Topic:  Leisure   ? ?Discussion/Intervention:  ?Group content today was focused on leisure. The group defined what leisure is and some positive leisure activities they participate in. Individuals identified the difference between good and bad leisure. Participants expressed how they feel after participating in the leisure of their choice. The group discussed how they go about picking a leisure activity and if others are involved in their leisure activities. The patient stated how many leisure activities they have to choose from and reasons why it is important to have leisure time. Individuals participated in the intervention ?Exploration of Leisure? where they had a chance to identify new leisure activities as well as benefits of leisure. ?Clinical Observations/Feedback: ?Patient came to group late aggressively pacing and snatch flowers, bushes and weeds out of the ground. When writer approached him, he stated he wanted to take these items inside to finish decorating his room. Patients request was denied, he threw the items down and aggressively paced back inside. Participant later return kicking a basketball the was in use by peers. Patient returned to room mutter words.  ? ? ?Jia Mohamed LRT/CTRS  ? ? ? ? ? ? ? ?Yaden Seith ?02/15/2022 12:33 PM ? ? ? ? ? ? ? ?Briane Birden ?02/15/2022 12:33 PM ?

## 2022-02-15 NOTE — Progress Notes (Signed)
[facility] ? ?02/15/2022 12:33 PM ?Christian Bond  ?MRN:  [id number] ?Subjective: Patient seen and chart reviewed.  21 year old man with schizophrenia.  Nursing reports that earlier this morning he started in with bizarre behaviors.  He unrolled all the toilet paper in his bathroom and threw it all over the room.  He used a long piece of it to tie a belt for himself.  He took a black crayon and made an upside down cross on his forehead and dark circles under his eyes.  Nursing reports bizarre posturing and behavior.  Patient seemed to have thought blocking when I talked to him.  Could not really describe why he was doing what he was doing except that he thought that may be "Satan whispered in my ear".  When I ask him if that meant he was having hallucinations he denied it but it was not very convincing.  Still could not today have a very lucid conversation.  Denies suicidal or homicidal ideation and has not been threatening to anyone else. ?Principal Problem: Paranoid schizophrenia (HCC) ?Diagnosis: Principal Problem: ?  Paranoid schizophrenia (HCC) ?Active Problems: ?  Psychosis (HCC) ? ?Total Time spent with patient: 30 minutes ? ?Past Psychiatric History: Patient has a history of recurrent psychotic symptoms ? ?Past Medical History:  ?Past Medical History:  ?Diagnosis Date  ? ADHD (attention deficit hyperactivity disorder)   ? Allergy   ?  ?Past Surgical History:  ?Procedure Laterality Date  ? TONSILLECTOMY AND ADENOIDECTOMY    ? ?Family History:  ?Family History  ?Problem Relation Age of Onset  ? Colon polyps Mother   ? Stroke Maternal Uncle   ? Heart disease Maternal Grandfather   ? Hyperlipidemia Maternal Grandfather   ? Stroke Maternal Grandfather   ? ?Family Psychiatric  History: See previous ?Social History:  ?Social History  ? ?Substance and Sexual Activity  ?Alcohol Use No  ?   ?Social History  ? ?Substance and Sexual Activity  ?Drug Use Yes  ? Types: Marijuana  ?  ?Social History   ? ?Socioeconomic History  ? Marital status: Single  ?  Spouse name: Not on file  ? Number of children: Not on file  ? Years of education: Not on file  ? Highest education level: Not on file  ?Occupational History  ? Not on file  ?Tobacco Use  ? Smoking status: Every Day  ?  Packs/day: 1.00  ?  Types: Cigarettes  ?  Passive exposure: Never  ? Smokeless tobacco: Never  ?Vaping Use  ? Vaping Use: Some days  ?Substance and Sexual Activity  ? Alcohol use: No  ? Drug use: Yes  ?  Types: Marijuana  ? Sexual activity: Not on file  ?Other Topics Concern  ? Not on file  ?Social History Narrative  ? Not on file  ? ?Social Determinants of Health  ? ?Financial Resource Strain: Not on file  ?Food Insecurity: Not on file  ?Transportation Needs: Not on file  ?Physical Activity: Not on file  ?Stress: Not on file  ?Social Connections: Not on file  ? ?Additional Social History:  ?  ?  ?  ?  ?  ?  ?  ?  ?  ?  ?  ? ?Sleep: Poor ? ?Appetite:  Fair ? ?Current Medications: ?Current Facility-Administered Medications  ?Medication Dose Route Frequency Provider Last Rate Last Admin  ? acetaminophen (TYLENOL) tablet 650 mg  650 mg Oral Q6H PRN Vanetta MuldersBarthold, Louise F, NP      ?  alum & mag hydroxide-simeth (MAALOX/MYLANTA) 200-200-20 MG/5ML suspension 30 mL  30 mL Oral Q4H PRN Gabriel Cirri F, NP      ? benztropine (COGENTIN) tablet 0.5 mg  0.5 mg Oral BID Zurii Hewes T, MD   0.5 mg at 02/15/22 1610  ? gabapentin (NEURONTIN) capsule 200 mg  200 mg Oral TID PRN Vanetta Mulders, NP      ? haloperidol (HALDOL) tablet 5 mg  5 mg Oral Q6H PRN Sullivan Jacuinde, Jackquline Denmark, MD   5 mg at 02/15/22 0944  ? Or  ? haloperidol lactate (HALDOL) injection 5 mg  5 mg Intramuscular Q6H PRN Blanche Gallien T, MD      ? hydrOXYzine (ATARAX) tablet 25 mg  25 mg Oral TID PRN Vanetta Mulders, NP      ? LORazepam (ATIVAN) tablet 2 mg  2 mg Oral Q6H PRN Regan Llorente, Jackquline Denmark, MD      ? Or  ? LORazepam (ATIVAN) injection 2 mg  2 mg Intramuscular Q6H PRN Aubery Date T, MD      ?  magnesium hydroxide (MILK OF MAGNESIA) suspension 30 mL  30 mL Oral Daily PRN Gabriel Cirri F, NP      ? paliperidone (INVEGA) 24 hr tablet 9 mg  9 mg Oral Daily Finola Rosal, Jackquline Denmark, MD   9 mg at 02/15/22 9604  ? traZODone (DESYREL) tablet 50 mg  50 mg Oral QHS PRN Vanetta Mulders, NP      ? ? ?Lab Results: No results found for this or any previous visit (from the past 48 hour(s)). ? ?Blood Alcohol level:  ?Lab Results  ?Component Value Date  ? ETH <10 02/11/2022  ? ETH <10 09/29/2021  ? ? ?Metabolic Disorder Labs: ?Lab Results  ?Component Value Date  ? HGBA1C 5.3 10/04/2021  ? MPG 105.41 10/04/2021  ? MPG 96.8 12/22/2020  ? ?No results found for: PROLACTIN ?Lab Results  ?Component Value Date  ? CHOL 146 10/04/2021  ? TRIG 78 10/04/2021  ? HDL 49 10/04/2021  ? CHOLHDL 3.0 10/04/2021  ? VLDL 16 10/04/2021  ? LDLCALC 81 10/04/2021  ? LDLCALC 160 (H) 12/22/2020  ? ? ?Physical Findings: ?AIMS:  , ,  ,  ,    ?CIWA:    ?COWS:    ? ?Musculoskeletal: ?Strength & Muscle Tone: within normal limits ?Gait & Station: normal ?Patient leans: N/A ? ?Psychiatric Specialty Exam: ? ?Presentation  ?General Appearance: Fairly Groomed ? ?Eye Contact:Fleeting ? ?Speech:Blocked ? ?Speech Volume:Normal ? ?Handedness:Right ? ? ?Mood and Affect  ?Mood:Anxious ? ?Affect:Congruent ? ? ?Thought Process  ?Thought Processes:Disorganized ? ?Descriptions of Associations:Intact ? ?Orientation:Full (Time, Place and Person) ? ?Thought Content:Paranoid Ideation ? ?History of Schizophrenia/Schizoaffective disorder:Yes ? ?Duration of Psychotic Symptoms:Greater than six months ? ?Hallucinations:No data recorded ?Ideas of Reference:Paranoia ? ?Suicidal Thoughts:No data recorded ?Homicidal Thoughts:No data recorded ? ?Sensorium  ?Memory:Immediate Fair; Recent Fair; Remote Fair ? ?Judgment:Impaired ? ?Insight:Lacking ? ? ?Executive Functions  ?Concentration:Fair ? ?Attention Span:Fair ? ?Recall:Fair ? ?Fund of  Knowledge:Fair ? ?Language:Fair ? ? ?Psychomotor Activity  ?Psychomotor Activity:No data recorded ? ?Assets  ?Assets:Desire for Improvement; Financial Resources/Insurance; Housing; Social Support; Talents/Skills; Vocational/Educational; Transportation; Physical Health ? ? ?Sleep  ?Sleep:No data recorded ? ? ?Physical Exam: ?Physical Exam ?Vitals and nursing note reviewed.  ?Constitutional:   ?   Comments: Patient used a black crayon to draw an upside down cross on his forehead and dark circles under his eyes.  ?HENT:  ?   Head: Normocephalic  and atraumatic.  ?   Mouth/Throat:  ?   Pharynx: Oropharynx is clear.  ?Eyes:  ?   Pupils: Pupils are equal, round, and reactive to light.  ?Cardiovascular:  ?   Rate and Rhythm: Normal rate and regular rhythm.  ?Pulmonary:  ?   Effort: Pulmonary effort is normal.  ?   Breath sounds: Normal breath sounds.  ?Abdominal:  ?   General: Abdomen is flat.  ?   Palpations: Abdomen is soft.  ?Musculoskeletal:     ?   General: Normal range of motion.  ?Skin: ?   General: Skin is warm and dry.  ?Neurological:  ?   General: No focal deficit present.  ?   Mental Status: He is alert. Mental status is at baseline.  ?Psychiatric:     ?   Attention and Perception: He is inattentive.     ?   Mood and Affect: Mood normal. Affect is blunt.     ?   Speech: He is noncommunicative. Speech is tangential.     ?   Behavior: Behavior is slowed.     ?   Thought Content: Thought content is delusional.     ?   Cognition and Memory: Cognition is impaired.     ?   Judgment: Judgment is inappropriate.  ? ?Review of Systems  ?Constitutional: Negative.   ?HENT: Negative.    ?Eyes: Negative.   ?Respiratory: Negative.    ?Cardiovascular: Negative.   ?Gastrointestinal: Negative.   ?Musculoskeletal: Negative.   ?Skin: Negative.   ?Neurological: Negative.   ?Psychiatric/Behavioral:  Positive for hallucinations. Negative for depression, memory loss, substance abuse and suicidal ideas. The patient is nervous/anxious.  The patient does not have insomnia.   ?Blood pressure 107/77, pulse 69, temperature 97.7 ?F (36.5 ?C), temperature source Oral, resp. rate 17, height 5\' 10"  (1.778 m), weight 59 kg, SpO2 100 %. Body mass index is 18.66 kg/m?. ? ? ?Treatment Plan Summary: ?Medication

## 2022-02-15 NOTE — Progress Notes (Signed)
Patient denies pain. Patient denies anxiety and depression. Patient denies SI/HI/AVH.  ?Patient states the crayon on his face is satan. Also that the toilet paper is a sign that [place] has fallen.  ?PRN haldol and ativan provided to patient.  ?Patient observed pacing around the unit.  ?Q15 minute safety checks maintained. Patient remains safe on the unit at this time.  ?

## 2022-02-15 NOTE — Group Note (Signed)
[facility] LCSW Group Therapy Note ? ? ?Group Date: 02/15/2022 ?Start Time: 1300 ?End Time: 1400 ? ? ?Type of Therapy/Topic:  Group Therapy:  Balance in Life ? ?Participation Level:  Did Not Attend  ? ?Description of Group:   ? This group will address the concept of balance and how it feels and looks when one is unbalanced. Patients will be encouraged to process areas in their lives that are out of balance, and identify reasons for remaining unbalanced. Facilitators will guide patients utilizing problem- solving interventions to address and correct the stressor making their life unbalanced. Understanding and applying boundaries will be explored and addressed for obtaining  and maintaining a balanced life. Patients will be encouraged to explore ways to assertively make their unbalanced needs known to significant others in their lives, using other group members and facilitator for support and feedback. ? ?Therapeutic Goals: ?Patient will identify two or more emotions or situations they have that consume much of in their lives. ?Patient will identify signs/triggers that life has become out of balance:  ?Patient will identify two ways to set boundaries in order to achieve balance in their lives:  ?Patient will demonstrate ability to communicate their needs through discussion and/or role plays ? ?Summary of Patient Progress: ?Patient did not attend group despite encouraged participation.  ? ? ? ?Therapeutic Modalities:   ?Cognitive Behavioral Therapy ?Solution-Focused Therapy ?Assertiveness Training ? ? ?Kiri Hinderliter W Ralpheal Zappone, LCSWA ?

## 2022-02-15 NOTE — Plan of Care (Signed)
?  Problem: Education: ?Goal: Knowledge of [facility] Education information/materials will improve ?Outcome: Progressing ?Goal: Emotional status will improve ?Outcome: Not Progressing ?Goal: Mental status will improve ?Outcome: Not Progressing ?Goal: Verbalization of understanding the information provided will improve ?Outcome: Progressing ?  ?Problem: Safety: ?Goal: Periods of time without injury will increase ?Outcome: Progressing ?  ?Problem: Education: ?Goal: Will be free of psychotic symptoms ?Outcome: Not Progressing ?Goal: Knowledge of the prescribed therapeutic regimen will improve ?Outcome: Progressing ?  ?Problem: Safety: ?Goal: Ability to redirect hostility and anger into socially appropriate behaviors will improve ?Outcome: Not Progressing ?Goal: Ability to remain free from injury will improve ?Outcome: Progressing ?  ?

## 2022-02-16 MED ORDER — QUETIAPINE FUMARATE 200 MG PO TABS
200.0000 mg | ORAL_TABLET | Freq: Every day | ORAL | Status: DC
Start: 2022-02-16 — End: 2022-02-19
  Administered 2022-02-16 – 2022-02-18 (×3): 200 mg via ORAL
  Filled 2022-02-16 (×3): qty 1

## 2022-02-16 NOTE — Plan of Care (Signed)
?  Problem: Education: ?Goal: Knowledge of [facility] Education information/materials will improve ?02/16/2022 1859 by Angeline Slim, RN ?Outcome: Not Progressing ?02/16/2022 1659 by Angeline Slim, RN ?Outcome: Progressing ?Goal: Emotional status will improve ?02/16/2022 1859 by Angeline Slim, RN ?Outcome: Not Progressing ?02/16/2022 1659 by Angeline Slim, RN ?Outcome: Progressing ?Goal: Mental status will improve ?02/16/2022 1859 by Angeline Slim, RN ?Outcome: Not Progressing ?02/16/2022 1659 by Angeline Slim, RN ?Outcome: Progressing ?Goal: Verbalization of understanding the information provided will improve ?02/16/2022 1859 by Angeline Slim, RN ?Outcome: Not Progressing ?02/16/2022 1659 by Angeline Slim, RN ?Outcome: Progressing ?  ?Problem: Safety: ?Goal: Periods of time without injury will increase ?02/16/2022 1859 by Angeline Slim, RN ?Outcome: Not Progressing ?02/16/2022 1659 by Angeline Slim, RN ?Outcome: Progressing ?  ?Problem: Education: ?Goal: Will be free of psychotic symptoms ?02/16/2022 1859 by Angeline Slim, RN ?Outcome: Not Progressing ?02/16/2022 1659 by Angeline Slim, RN ?Outcome: Progressing ?Goal: Knowledge of the prescribed therapeutic regimen will improve ?02/16/2022 1859 by Angeline Slim, RN ?Outcome: Not Progressing ?02/16/2022 1659 by Angeline Slim, RN ?Outcome: Progressing ?  ?Problem: Safety: ?Goal: Ability to redirect hostility and anger into socially appropriate behaviors will improve ?02/16/2022 1859 by Angeline Slim, RN ?Outcome: Not Progressing ?02/16/2022 1659 by Angeline Slim, RN ?Outcome: Progressing ?Goal: Ability to remain free from injury will improve ?02/16/2022 1859 by Angeline Slim, RN ?Outcome: Not Progressing ?02/16/2022 1659 by Angeline Slim, RN ?Outcome: Progressing ?  ?Problem: Education: ?Goal: Will be free of psychotic symptoms ?02/16/2022 1859 by Angeline Slim, RN ?Outcome: Not Progressing ?02/16/2022  1659 by Angeline Slim, RN ?Outcome: Progressing ?Goal: Knowledge of the prescribed therapeutic regimen will improve ?02/16/2022 1859 by Angeline Slim, RN ?Outcome: Not Progressing ?02/16/2022 1659 by Angeline Slim, RN ?Outcome: Progressing ?  ?Problem: Safety: ?Goal: Ability to redirect hostility and anger into socially appropriate behaviors will improve ?02/16/2022 1859 by Angeline Slim, RN ?Outcome: Not Progressing ?02/16/2022 1659 by Angeline Slim, RN ?Outcome: Progressing ?Goal: Ability to remain free from injury will improve ?02/16/2022 1859 by Angeline Slim, RN ?Outcome: Not Progressing ?02/16/2022 1659 by Angeline Slim, RN ?Outcome: Progressing ?  ?

## 2022-02-16 NOTE — Progress Notes (Signed)
D: Pt alert and oriented. Pt rates depression 0/10, hopelessness 0/10, and anxiety 0/10.  Patient continues to escalate in behavior. Continues to be agitated and pace. Standing in the doorway in a postured stance and staring at peers and staff. Difficult to redirect. Refuses to take po meds without persistent coercion from staff. Prn medications given this shift.  ? ?A:  Support and encouragement provided. Frequent verbal contact made. Routine safety checks conducted q15 minutes.  ? ?R: No adverse drug reactions noted. Pt remains in his room most of the evening. Cont Q15 minute checks for safety.  ?

## 2022-02-16 NOTE — Progress Notes (Signed)
Recreation Therapy Notes ? ? ?Date: 02/16/2022 ? ?Time: 10:30 am    ? ?Location: Courtyard   ? ?Behavioral response: Appropriate ? ?Intervention Topic:  Social Skills  ? ?Discussion/Intervention:  ?Group content on today was focused on social skills. The group defined social skills and identified ways they use social skills. Patients expressed what obstacles they face when trying to be social. Participants described the importance of social skills. The group listed ways to improve social skills and reasons to improve social skills. Individuals had an opportunity to learn new and improve social skills as well as identify their weaknesses. ?Clinical Observations/Feedback: ?Patient came to group and sat for a few minutes and returned to his room.  ?Heyden Jaber LRT/CTRS  ? ? ? ? ? ? ? ?Christian Bond ?02/16/2022 12:50 PM ?

## 2022-02-16 NOTE — Progress Notes (Signed)
Patient calm and pleasant during assessment denying SI/HI/AVH. Pt observed interacting appropriately with staff and peers on the unit. Patient didn't have any medications scheduled tonight and hasn't requested anything PRN. Pt being monitored Q 15 minutes for safety per unit protocol. Pt remains safe on the uinit.  ?

## 2022-02-16 NOTE — Progress Notes (Signed)
[facility] ? ?02/16/2022 2:59 PM ?Christian HurtWilliam D Bond  ?MRN:  [id number] ?Subjective: Patient seen and chart reviewed.  Patient continues with bizarre behavior.  Tearing up his clothing bizarrely.  Pacing around with a confused look.  Standing in his doorway staring at people.  Came to me today rather agitated demanding to know what the red tank outside his window was.  We went over and looked and he was indicating a gas tank over on a construction site across the parking lot.  I told him I did not know what it was but it appeared to probably be a tank of some sort and was not relevant to his situation.  Patient was asking for discharge saying he felt nervous.  Has still been talking at times about worshiping Satan and that sort of thing ?Principal Problem: Paranoid schizophrenia (HCC) ?Diagnosis: Principal Problem: ?  Paranoid schizophrenia (HCC) ?Active Problems: ?  Psychosis (HCC) ? ?Total Time spent with patient: 30 minutes ? ?Past Psychiatric History: Past history of what seems like a gradually more established psychosis ? ?Past Medical History:  ?Past Medical History:  ?Diagnosis Date  ? ADHD (attention deficit hyperactivity disorder)   ? Allergy   ?  ?Past Surgical History:  ?Procedure Laterality Date  ? TONSILLECTOMY AND ADENOIDECTOMY    ? ?Family History:  ?Family History  ?Problem Relation Age of Onset  ? Colon polyps Mother   ? Stroke Maternal Uncle   ? Heart disease Maternal Grandfather   ? Hyperlipidemia Maternal Grandfather   ? Stroke Maternal Grandfather   ? ?Family Psychiatric  History: See previous ?Social History:  ?Social History  ? ?Substance and Sexual Activity  ?Alcohol Use No  ?   ?Social History  ? ?Substance and Sexual Activity  ?Drug Use Yes  ? Types: Marijuana  ?  ?Social History  ? ?Socioeconomic History  ? Marital status: Single  ?  Spouse name: Not on file  ? Number of children: Not on file  ? Years of education: Not on file  ? Highest education level: Not on file  ?Occupational  History  ? Not on file  ?Tobacco Use  ? Smoking status: Every Day  ?  Packs/day: 1.00  ?  Types: Cigarettes  ?  Passive exposure: Never  ? Smokeless tobacco: Never  ?Vaping Use  ? Vaping Use: Some days  ?Substance and Sexual Activity  ? Alcohol use: No  ? Drug use: Yes  ?  Types: Marijuana  ? Sexual activity: Not on file  ?Other Topics Concern  ? Not on file  ?Social History Narrative  ? Not on file  ? ?Social Determinants of Health  ? ?Financial Resource Strain: Not on file  ?Food Insecurity: Not on file  ?Transportation Needs: Not on file  ?Physical Activity: Not on file  ?Stress: Not on file  ?Social Connections: Not on file  ? ?Additional Social History:  ?  ?  ?  ?  ?  ?  ?  ?  ?  ?  ?  ? ?Sleep: Poor ? ?Appetite:  Fair ? ?Current Medications: ?Current Facility-Administered Medications  ?Medication Dose Route Frequency Provider Last Rate Last Admin  ? acetaminophen (TYLENOL) tablet 650 mg  650 mg Oral Q6H PRN Vanetta MuldersBarthold, Louise F, NP      ? alum & mag hydroxide-simeth (MAALOX/MYLANTA) 200-200-20 MG/5ML suspension 30 mL  30 mL Oral Q4H PRN Gabriel CirriBarthold, Louise F, NP      ? benztropine (COGENTIN) tablet 0.5 mg  0.5  mg Oral BID Bellamia Ferch T, MD   0.5 mg at 02/16/22 8127  ? gabapentin (NEURONTIN) capsule 200 mg  200 mg Oral TID PRN Vanetta Mulders, NP      ? haloperidol (HALDOL) tablet 5 mg  5 mg Oral Q6H PRN Burt Piatek, Jackquline Denmark, MD   5 mg at 02/15/22 1701  ? Or  ? haloperidol lactate (HALDOL) injection 5 mg  5 mg Intramuscular Q6H PRN Tagan Bartram, Jackquline Denmark, MD   5 mg at 02/16/22 5170  ? hydrOXYzine (ATARAX) tablet 25 mg  25 mg Oral TID PRN Vanetta Mulders, NP   25 mg at 02/16/22 0174  ? LORazepam (ATIVAN) tablet 2 mg  2 mg Oral Q6H PRN Adger Cantera, Jackquline Denmark, MD   2 mg at 02/15/22 1701  ? Or  ? LORazepam (ATIVAN) injection 2 mg  2 mg Intramuscular Q6H PRN Chessica Audia T, MD   2 mg at 02/16/22 9449  ? magnesium hydroxide (MILK OF MAGNESIA) suspension 30 mL  30 mL Oral Daily PRN Gabriel Cirri F, NP      ? QUEtiapine  (SEROQUEL) tablet 200 mg  200 mg Oral QHS Khalin Royce T, MD      ? traZODone (DESYREL) tablet 50 mg  50 mg Oral QHS PRN Vanetta Mulders, NP      ? ? ?Lab Results: No results found for this or any previous visit (from the past 48 hour(s)). ? ?Blood Alcohol level:  ?Lab Results  ?Component Value Date  ? ETH <10 02/11/2022  ? ETH <10 09/29/2021  ? ? ?Metabolic Disorder Labs: ?Lab Results  ?Component Value Date  ? HGBA1C 5.3 10/04/2021  ? MPG 105.41 10/04/2021  ? MPG 96.8 12/22/2020  ? ?No results found for: PROLACTIN ?Lab Results  ?Component Value Date  ? CHOL 146 10/04/2021  ? TRIG 78 10/04/2021  ? HDL 49 10/04/2021  ? CHOLHDL 3.0 10/04/2021  ? VLDL 16 10/04/2021  ? LDLCALC 81 10/04/2021  ? LDLCALC 160 (H) 12/22/2020  ? ? ?Physical Findings: ?AIMS:  , ,  ,  ,    ?CIWA:    ?COWS:    ? ?Musculoskeletal: ?Strength & Muscle Tone: within normal limits ?Gait & Station: normal ?Patient leans: N/A ? ?Psychiatric Specialty Exam: ? ?Presentation  ?General Appearance: Fairly Groomed ? ?Eye Contact:Fleeting ? ?Speech:Blocked ? ?Speech Volume:Normal ? ?Handedness:Right ? ? ?Mood and Affect  ?Mood:Anxious ? ?Affect:Congruent ? ? ?Thought Process  ?Thought Processes:Disorganized ? ?Descriptions of Associations:Intact ? ?Orientation:Full (Time, Place and Person) ? ?Thought Content:Paranoid Ideation ? ?History of Schizophrenia/Schizoaffective disorder:Yes ? ?Duration of Psychotic Symptoms:Greater than six months ? ?Hallucinations:No data recorded ?Ideas of Reference:Paranoia ? ?Suicidal Thoughts:No data recorded ?Homicidal Thoughts:No data recorded ? ?Sensorium  ?Memory:Immediate Fair; Recent Fair; Remote Fair ? ?Judgment:Impaired ? ?Insight:Lacking ? ? ?Executive Functions  ?Concentration:Fair ? ?Attention Span:Fair ? ?Recall:Fair ? ?Fund of Knowledge:Fair ? ?Language:Fair ? ? ?Psychomotor Activity  ?Psychomotor Activity:No data recorded ? ?Assets  ?Assets:Desire for Improvement; Financial Resources/Insurance; Housing; Social  Support; Talents/Skills; Vocational/Educational; Transportation; Physical Health ? ? ?Sleep  ?Sleep:No data recorded ? ? ?Physical Exam: ?Physical Exam ?Vitals and nursing note reviewed.  ?Constitutional:   ?   Appearance: Normal appearance.  ?HENT:  ?   Head: Normocephalic and atraumatic.  ?   Mouth/Throat:  ?   Pharynx: Oropharynx is clear.  ?Eyes:  ?   Pupils: Pupils are equal, round, and reactive to light.  ?Cardiovascular:  ?   Rate and Rhythm: Normal rate and regular rhythm.  ?  Pulmonary:  ?   Effort: Pulmonary effort is normal.  ?   Breath sounds: Normal breath sounds.  ?Abdominal:  ?   General: Abdomen is flat.  ?   Palpations: Abdomen is soft.  ?Musculoskeletal:     ?   General: Normal range of motion.  ?Skin: ?   General: Skin is warm and dry.  ?Neurological:  ?   General: No focal deficit present.  ?   Mental Status: He is alert. Mental status is at baseline.  ?Psychiatric:     ?   Mood and Affect: Mood normal. Affect is blunt.     ?   Speech: Speech is tangential.     ?   Behavior: Behavior is agitated.     ?   Thought Content: Thought content is paranoid.     ?   Cognition and Memory: Cognition is impaired.  ? ?Review of Systems  ?Constitutional: Negative.   ?HENT: Negative.    ?Eyes: Negative.   ?Respiratory: Negative.    ?Cardiovascular: Negative.   ?Gastrointestinal: Negative.   ?Musculoskeletal: Negative.   ?Skin: Negative.   ?Neurological: Negative.   ?Psychiatric/Behavioral:  Negative for depression, substance abuse and suicidal ideas. The patient is nervous/anxious.   ?Blood pressure (!) 147/92, pulse (!) 106, temperature 98.6 ?F (37 ?C), temperature source Oral, resp. rate 17, height 5\' 10"  (1.778 m), weight 59 kg, SpO2 99 %. Body mass index is 18.66 kg/m?. ? ? ?Treatment Plan Summary: ?Medication management and Plan although it has only been a few days on the [place] he certainly does not seem like he is any better than when he came in.  I discussed this with the patient and propose that we  switch him back to Seroquel that had helped previously and he agreed to that.  I will switch him back to 300 mg of Seroquel at night.  Supportive counseling and therapy and encouragement to be patient. ? ?[place],

## 2022-02-16 NOTE — Plan of Care (Signed)
  Problem: Education: Goal: Knowledge of [facility] Education information/materials will improve Outcome: Progressing Goal: Emotional status will improve Outcome: Progressing Goal: Mental status will improve Outcome: Progressing Goal: Verbalization of understanding the information provided will improve Outcome: Progressing   Problem: Safety: Goal: Periods of time without injury will increase Outcome: Progressing   Problem: Education: Goal: Will be free of psychotic symptoms Outcome: Progressing Goal: Knowledge of the prescribed therapeutic regimen will improve Outcome: Progressing   Problem: Safety: Goal: Ability to redirect hostility and anger into socially appropriate behaviors will improve Outcome: Progressing Goal: Ability to remain free from injury will improve Outcome: Progressing   

## 2022-02-17 MED ORDER — GABAPENTIN 100 MG PO CAPS
100.0000 mg | ORAL_CAPSULE | Freq: Three times a day (TID) | ORAL | Status: DC
  Administered 2022-02-17 – 2022-02-22 (×15): 100 mg via ORAL
  Filled 2022-02-17 (×15): qty 1

## 2022-02-17 NOTE — Group Note (Signed)
[facility] LCSW Group Therapy Note ? ? ?Group Date: 02/17/2022 ?Start Time: 1300 ?End Time: 1400 ? ?Type of Therapy and Topic:  Group Therapy:  Feelings around Relapse and Recovery ? ?Participation Level:  Did Not Attend  ? ?Mood: ? ?Description of Group:   ? Patients in this group will discuss emotions they experience before and after a relapse. They will process how experiencing these feelings, or avoidance of experiencing them, relates to having a relapse. Facilitator will guide patients to explore emotions they have related to recovery. Patients will be encouraged to process which emotions are more powerful. They will be guided to discuss the emotional reaction significant others in their lives may have to patients? relapse or recovery. Patients will be assisted in exploring ways to respond to the emotions of others without this contributing to a relapse. ? ?Therapeutic Goals: ?Patient will identify two or more emotions that lead to relapse for them:  ?Patient will identify two emotions that result when they relapse:  ?Patient will identify two emotions related to recovery:  ?Patient will demonstrate ability to communicate their needs through discussion and/or role plays. ? ? ?Summary of Patient Progress: Patient did not attend group despite encouraged participation. ? ? ?Therapeutic Modalities:   ?Cognitive Behavioral Therapy ?Solution-Focused Therapy ?Assertiveness Training ?Relapse Prevention Therapy ? ? ?Dominick Morella K Ilene Witcher, LCSWA ?

## 2022-02-17 NOTE — Plan of Care (Signed)
Patient rated his depression and anxiety 0/10. Patient stated that his goal is working on his discharge. Patient appropriate with staff & peers. Patient stated that he had a good day.Patient was outside in the courtyard. Denies SI,HI and AVH. Compliant with medications. Appetite and energy level good. ADLs maintained. Support and encouragement given. ?

## 2022-02-17 NOTE — Progress Notes (Signed)
[facility] ? ?02/17/2022 10:58 AM ?Christian Bond  ?MRN:  [id number] ? ?Principal Problem: Paranoid schizophrenia (HCC) ?Diagnosis: Principal Problem: ?  Paranoid schizophrenia (HCC) ?Active Problems: ?  Psychosis (HCC) ? ?Patient is a 21 y.o. man with Schizophrenia [facility].  ? ? ?Interval History ?Patient was seen today for re-evaluation.  Nursing reports no events overnight. The patient has no issues with performing ADLs.  Patient has been medication compliant.   ? ?Subjective:  On assessment patient reports "I am good. I feel pretty balanced". ?He is calm today. He observed by me sleeping in his room or sitting on the bed and looking outside his window. Not agitated today. He is not talking about Satan on such. He denies any complaints. Denies feeling depressed, anxious, angry, suicidal, homicidal. He denies any hallucinations. He does not express any delusional content during the interview, although appears guarded and likely minimizes symptoms. ?The patient reports no side effects from medications.   ? ?Labs: no new results for review. ? ?  ? ?Total Time spent with patient: 20 minutes ? ?Past Psychiatric History:  ? ?Past Medical History:  ?Past Medical History:  ?Diagnosis Date  ? ADHD (attention deficit hyperactivity disorder)   ? Allergy   ?  ?Past Surgical History:  ?Procedure Laterality Date  ? TONSILLECTOMY AND ADENOIDECTOMY    ? ?Family History:  ?Family History  ?Problem Relation Age of Onset  ? Colon polyps Mother   ? Stroke Maternal Uncle   ? Heart disease Maternal Grandfather   ? Hyperlipidemia Maternal Grandfather   ? Stroke Maternal Grandfather   ? ?Family Psychiatric  History:  ?Social History:  ?Social History  ? ?Substance and Sexual Activity  ?Alcohol Use No  ?   ?Social History  ? ?Substance and Sexual Activity  ?Drug Use Yes  ? Types: Marijuana  ?  ?Social History  ? ?Socioeconomic History  ? Marital status: Single  ?  Spouse name: Not  on file  ? Number of children: Not on file  ? Years of education: Not on file  ? Highest education level: Not on file  ?Occupational History  ? Not on file  ?Tobacco Use  ? Smoking status: Every Day  ?  Packs/day: 1.00  ?  Types: Cigarettes  ?  Passive exposure: Never  ? Smokeless tobacco: Never  ?Vaping Use  ? Vaping Use: Some days  ?Substance and Sexual Activity  ? Alcohol use: No  ? Drug use: Yes  ?  Types: Marijuana  ? Sexual activity: Not on file  ?Other Topics Concern  ? Not on file  ?Social History Narrative  ? Not on file  ? ?Social Determinants of Health  ? ?Financial Resource Strain: Not on file  ?Food Insecurity: Not on file  ?Transportation Needs: Not on file  ?Physical Activity: Not on file  ?Stress: Not on file  ?Social Connections: Not on file  ? ?Additional Social History:  ?  ?  ?  ?  ?  ?  ?  ?  ?  ?  ?  ? ?Sleep: Fair ? ?Appetite:  Fair ? ?Current Medications: ?Current Facility-Administered Medications  ?Medication Dose Route Frequency Provider Last Rate Last Admin  ? acetaminophen (TYLENOL) tablet 650 mg  650 mg Oral Q6H PRN Vanetta MuldersBarthold, Louise F, NP      ? alum & mag hydroxide-simeth (MAALOX/MYLANTA) 200-200-20 MG/5ML suspension 30 mL  30 mL Oral Q4H PRN Vanetta MuldersBarthold, Louise F,  NP      ? benztropine (COGENTIN) tablet 0.5 mg  0.5 mg Oral BID Clapacs, Jackquline Denmark, MD   0.5 mg at 02/17/22 0758  ? gabapentin (NEURONTIN) capsule 200 mg  200 mg Oral TID PRN Vanetta Mulders, NP      ? haloperidol (HALDOL) tablet 5 mg  5 mg Oral Q6H PRN Clapacs, Jackquline Denmark, MD   5 mg at 02/15/22 1701  ? Or  ? haloperidol lactate (HALDOL) injection 5 mg  5 mg Intramuscular Q6H PRN Clapacs, Jackquline Denmark, MD   5 mg at 02/16/22 2202  ? hydrOXYzine (ATARAX) tablet 25 mg  25 mg Oral TID PRN Vanetta Mulders, NP   25 mg at 02/16/22 5427  ? LORazepam (ATIVAN) tablet 2 mg  2 mg Oral Q6H PRN Clapacs, Jackquline Denmark, MD   2 mg at 02/15/22 1701  ? Or  ? LORazepam (ATIVAN) injection 2 mg  2 mg Intramuscular Q6H PRN Clapacs, John T, MD   2 mg at 02/16/22  0623  ? magnesium hydroxide (MILK OF MAGNESIA) suspension 30 mL  30 mL Oral Daily PRN Gabriel Cirri F, NP      ? QUEtiapine (SEROQUEL) tablet 200 mg  200 mg Oral QHS Clapacs, John T, MD   200 mg at 02/16/22 2148  ? traZODone (DESYREL) tablet 50 mg  50 mg Oral QHS PRN Vanetta Mulders, NP   50 mg at 02/16/22 2147  ? ? ?Lab Results: No results found for this or any previous visit (from the past 48 hour(s)). ? ?Blood Alcohol level:  ?Lab Results  ?Component Value Date  ? ETH <10 02/11/2022  ? ETH <10 09/29/2021  ? ? ?Metabolic Disorder Labs: ?Lab Results  ?Component Value Date  ? HGBA1C 5.3 10/04/2021  ? MPG 105.41 10/04/2021  ? MPG 96.8 12/22/2020  ? ?No results found for: PROLACTIN ?Lab Results  ?Component Value Date  ? CHOL 146 10/04/2021  ? TRIG 78 10/04/2021  ? HDL 49 10/04/2021  ? CHOLHDL 3.0 10/04/2021  ? VLDL 16 10/04/2021  ? LDLCALC 81 10/04/2021  ? LDLCALC 160 (H) 12/22/2020  ? ? ?Physical Findings: ?AIMS:  , ,  ,  ,    ?CIWA:    ?COWS:    ? ?Musculoskeletal: ?Strength & Muscle Tone: within normal limits ?Gait & Station: normal ?Patient leans: N/A ? ?Psychiatric Specialty Exam: ? ?Presentation  ?General Appearance: Fairly Groomed ? ?Eye Contact:Fleeting ? ?Speech:Blocked ? ?Speech Volume:Normal ? ?Handedness:Right ? ? ?Mood and Affect  ?Mood:Anxious ? ?Affect:Congruent ? ? ?Thought Process  ?Thought Processes:Disorganized ? ?Descriptions of Associations:Intact ? ?Orientation:Full (Time, Place and Person) ? ?Thought Content:Paranoid Ideation ? ?History of Schizophrenia/Schizoaffective disorder:Yes ? ?Duration of Psychotic Symptoms:Greater than six months ? ?Hallucinations:No data recorded ?Ideas of Reference:Paranoia ? ?Suicidal Thoughts:No data recorded ?Homicidal Thoughts:No data recorded ? ?Sensorium  ?Memory:Immediate Fair; Recent Fair; Remote Fair ? ?Judgment:Impaired ? ?Insight:Lacking ? ? ?Executive Functions  ?Concentration:Fair ? ?Attention Span:Fair ? ?Recall:Fair ? ?Fund of  Knowledge:Fair ? ?Language:Fair ? ? ?Psychomotor Activity  ?Psychomotor Activity:No data recorded ? ?Assets  ?Assets:Desire for Improvement; Financial Resources/Insurance; Housing; Social Support; Talents/Skills; Vocational/Educational; Transportation; Physical Health ? ? ?Sleep  ?Sleep:No data recorded ? ? ?Physical Exam: ?Physical Exam ?ROS ?Blood pressure 98/75, pulse (!) 109, temperature 98.1 ?F (36.7 ?C), temperature source Oral, resp. rate (!) 1, height 5\' 10"  (1.778 m), weight 59 kg, SpO2 98 %. Body mass index is 18.66 kg/m?. ? ? ?Treatment Plan Summary: ?Daily contact with patient to assess  and evaluate symptoms and progress in treatment and Medication management ? ?Patient is a 21 year old man with the above-stated past psychiatric history who is seen in follow-up.  Chart reviewed. Patient discussed with nursing. ?Patient appears calm and able to participate in meaningful conversation, that appears to be a positive effect of Seroquel, started last night. Will continue current medicine without changes today.  ?  ?Plan: ? ?-continue inpatient psych admission; 15-minute checks; daily contact with patient to assess and evaluate symptoms and progress in treatment; psychoeducation. ? ?-continue scheduled medications: ? benztropine  0.5 mg Oral BID  ? QUEtiapine  200 mg Oral QHS  ? ? ?-continue PRN medications. ? acetaminophen, alum & mag hydroxide-simeth, gabapentin, haloperidol **OR** haloperidol lactate, hydrOXYzine, LORazepam **OR** LORazepam, magnesium hydroxide, traZODone ? ?-Pertinent Labs: no new labs ordered today ?   ?-Consults: No new consults placed since yesterday  ?  ?-Disposition: Estimated duration of hospitalization: hopefully sometime next week. All necessary aftercare will be arranged prior to discharge Likely d/c home with outpatient psych follow-up. ? ?-  I certify that the patient does need, on a daily basis, active treatment furnished directly by or requiring the supervision of inpatient  psychiatric facility personnel.   ? ?Thalia Party, MD ?02/17/2022, 10:58 AM ? ?

## 2022-02-18 NOTE — Progress Notes (Signed)
The patient was visible in the milieu. He seemed somewhat anxious due to him pacing and restlessness but he denied needing any further medication than his standard. He denies SI, HI & AVH.  ?

## 2022-02-18 NOTE — Plan of Care (Signed)
D- Patient alert and oriented. Patient presents in a pleasant mood on assessment reporting that he slept good last night and had no complaints to voice to this Clinical research associate. Patient denies SI, HI, AVH, and pain at this time. Patient also denies any signs/symptoms of depression/anxiety. Patient's goal for today is "discharge", in which he will "talk with the doctor", in order to achieve his goal. ? ?A- Scheduled medications administered to patient, per MD orders. Support and encouragement provided.  Routine safety checks conducted every 15 minutes.  Patient informed to notify staff with problems or concerns. ? ?R- No adverse drug reactions noted. Patient contracts for safety at this time. Patient compliant with medications and treatment plan. Patient receptive, calm, and cooperative. Patient interacts well with others on the unit.  Patient remains safe at this time. ? ?Problem: Education: ?Goal: Knowledge of [facility] Education information/materials will improve ?Outcome: Progressing ?Goal: Emotional status will improve ?Outcome: Progressing ?Goal: Mental status will improve ?Outcome: Progressing ?Goal: Verbalization of understanding the information provided will improve ?Outcome: Progressing ?  ?Problem: Safety: ?Goal: Periods of time without injury will increase ?Outcome: Progressing ?  ?Problem: Education: ?Goal: Will be free of psychotic symptoms ?Outcome: Progressing ?Goal: Knowledge of the prescribed therapeutic regimen will improve ?Outcome: Progressing ?  ?Problem: Safety: ?Goal: Ability to redirect hostility and anger into socially appropriate behaviors will improve ?Outcome: Progressing ?Goal: Ability to remain free from injury will improve ?Outcome: Progressing ?  ?

## 2022-02-18 NOTE — Group Note (Signed)
LCSW Group Therapy Note ? ?Group Date: 02/18/2022 ?Start Time: 1300 ?End Time: 1400 ? ? ?Type of Therapy and Topic:  Group Therapy - Healthy vs Unhealthy Coping Skills ? ?Participation Level:  Did Not Attend  ? ?Description of Group ?The focus of this group was to determine what unhealthy coping techniques typically are used by group members and what healthy coping techniques would be helpful in coping with various problems. Patients were guided in becoming aware of the differences between healthy and unhealthy coping techniques. Patients were asked to identify 2-3 healthy coping skills they would like to learn to use more effectively. ? ?Therapeutic Goals ?Patients learned that coping is what human beings do all day long to deal with various situations in their lives ?Patients defined and discussed healthy vs unhealthy coping techniques ?Patients identified their preferred coping techniques and identified whether these were healthy or unhealthy ?Patients determined 2-3 healthy coping skills they would like to become more familiar with and use more often. ?Patients provided support and ideas to each other ? ? ?Summary of Patient Progress: Due to limited staffing, group was not held on the unit. ? ?Therapeutic Modalities ?Cognitive Behavioral Therapy ?Motivational Interviewing ? ?Costas Sena K Zygmunt Mcglinn, LCSWA ?02/18/2022  2:57 PM   ?

## 2022-02-18 NOTE — Progress Notes (Signed)
[facility] ? ?02/18/2022 9:12 AM ?Christian HurtWilliam D Bond  ?MRN:  [id number] ? ?Principal Problem: Paranoid schizophrenia (HCC) ?Diagnosis: Principal Problem: ?  Paranoid schizophrenia (HCC) ?Active Problems: ?  Psychosis (HCC) ? ?Patient is a 21 y.o. man with Schizophrenia [facility]  ? ? ?Interval History ?Patient was seen today for re-evaluation.  Nursing reports no events overnight. The patient has no issues with performing ADLs.  Patient has been medication compliant.   ? ?Subjective:  On assessment patient reports "I am doing very good today". He remains calm. He is visible in the milieu. His conversations are appropriate; he is not talking about Satan on such. He denies any complaints. Denies feeling depressed, anxious, angry, suicidal, homicidal. He denies any hallucinations. He does not express any delusional content during the interview. He reports no side effects from medications.   ? ?Labs: no new results for review. ? ?  ? ?Total Time spent with patient: 20 minutes ? ?Past Psychiatric History:  ? ?Past Medical History:  ?Past Medical History:  ?Diagnosis Date  ? ADHD (attention deficit hyperactivity disorder)   ? Allergy   ?  ?Past Surgical History:  ?Procedure Laterality Date  ? TONSILLECTOMY AND ADENOIDECTOMY    ? ?Family History:  ?Family History  ?Problem Relation Age of Onset  ? Colon polyps Mother   ? Stroke Maternal Uncle   ? Heart disease Maternal Grandfather   ? Hyperlipidemia Maternal Grandfather   ? Stroke Maternal Grandfather   ? ?Family Psychiatric  History:  ?Social History:  ?Social History  ? ?Substance and Sexual Activity  ?Alcohol Use No  ?   ?Social History  ? ?Substance and Sexual Activity  ?Drug Use Yes  ? Types: Marijuana  ?  ?Social History  ? ?Socioeconomic History  ? Marital status: Single  ?  Spouse name: Not on file  ? Number of children: Not on file  ? Years of education: Not on file  ? Highest education level: Not on file   ?Occupational History  ? Not on file  ?Tobacco Use  ? Smoking status: Every Day  ?  Packs/day: 1.00  ?  Types: Cigarettes  ?  Passive exposure: Never  ? Smokeless tobacco: Never  ?Vaping Use  ? Vaping Use: Some days  ?Substance and Sexual Activity  ? Alcohol use: No  ? Drug use: Yes  ?  Types: Marijuana  ? Sexual activity: Not on file  ?Other Topics Concern  ? Not on file  ?Social History Narrative  ? Not on file  ? ?Social Determinants of Health  ? ?Financial Resource Strain: Not on file  ?Food Insecurity: Not on file  ?Transportation Needs: Not on file  ?Physical Activity: Not on file  ?Stress: Not on file  ?Social Connections: Not on file  ? ?Additional Social History:  ?  ?  ?  ?  ?  ?  ?  ?  ?  ?  ?  ? ?Sleep: Fair ? ?Appetite:  Fair ? ?Current Medications: ?Current Facility-Administered Medications  ?Medication Dose Route Frequency Provider Last Rate Last Admin  ? acetaminophen (TYLENOL) tablet 650 mg  650 mg Oral Q6H PRN Christian MuldersBarthold, Christian F, NP      ? alum & mag hydroxide-simeth (MAALOX/MYLANTA) 200-200-20 MG/5ML suspension 30 mL  30 mL Oral Q4H PRN Christian CirriBarthold, Christian F, NP      ? benztropine (COGENTIN) tablet 0.5 mg  0.5 mg Oral BID Clapacs, Christian DenmarkJohn T, MD  0.5 mg at 02/18/22 0801  ? gabapentin (NEURONTIN) capsule 100 mg  100 mg Oral TID Christian Rings, NP   100 mg at 02/18/22 0801  ? haloperidol (HALDOL) tablet 5 mg  5 mg Oral Q6H PRN Clapacs, Christian Denmark, MD   5 mg at 02/15/22 1701  ? Or  ? haloperidol lactate (HALDOL) injection 5 mg  5 mg Intramuscular Q6H PRN Clapacs, Christian Denmark, MD   5 mg at 02/16/22 8756  ? hydrOXYzine (ATARAX) tablet 25 mg  25 mg Oral TID PRN Christian Mulders, NP   25 mg at 02/16/22 4332  ? LORazepam (ATIVAN) tablet 2 mg  2 mg Oral Q6H PRN Clapacs, Christian Denmark, MD   2 mg at 02/15/22 1701  ? Or  ? LORazepam (ATIVAN) injection 2 mg  2 mg Intramuscular Q6H PRN Clapacs, Christian T, MD   2 mg at 02/16/22 9518  ? magnesium hydroxide (MILK OF MAGNESIA) suspension 30 mL  30 mL Oral Daily PRN Christian Bond F,  NP      ? QUEtiapine (SEROQUEL) tablet 200 mg  200 mg Oral QHS Clapacs, Christian T, MD   200 mg at 02/17/22 2206  ? traZODone (DESYREL) tablet 50 mg  50 mg Oral QHS PRN Christian Mulders, NP   50 mg at 02/16/22 2147  ? ? ?Lab Results: No results found for this or any previous visit (from the past 48 hour(s)). ? ?Blood Alcohol level:  ?Lab Results  ?Component Value Date  ? ETH <10 02/11/2022  ? ETH <10 09/29/2021  ? ? ?Metabolic Disorder Labs: ?Lab Results  ?Component Value Date  ? HGBA1C 5.3 10/04/2021  ? MPG 105.41 10/04/2021  ? MPG 96.8 12/22/2020  ? ?No results found for: PROLACTIN ?Lab Results  ?Component Value Date  ? CHOL 146 10/04/2021  ? TRIG 78 10/04/2021  ? HDL 49 10/04/2021  ? CHOLHDL 3.0 10/04/2021  ? VLDL 16 10/04/2021  ? LDLCALC 81 10/04/2021  ? LDLCALC 160 (H) 12/22/2020  ? ? ?Physical Findings: ?AIMS:  , ,  ,  ,    ?CIWA:    ?COWS:    ? ?Musculoskeletal: ?Strength & Muscle Tone: within normal limits ?Gait & Station: normal ?Patient leans: N/A ? ?Psychiatric Specialty Exam: ? ?Presentation  ?General Appearance: Fairly Groomed ? ?Eye Contact:Fleeting ? ?Speech: wnl ?Speech Volume:Normal ? ?Handedness:Right ? ? ?Mood and Affect  ?Mood: calm "good" ?Affect:Congruent ? ? ?Thought Process  ?Thought Processes: better organized ?Descriptions of Associations:Intact ? ?Orientation:Full (Time, Place and Person) ? ?Thought Content: no paranoia today ?History of Schizophrenia/Schizoaffective disorder:Yes ? ?Duration of Psychotic Symptoms:Greater than six months ? ?Hallucinations:No data recorded ?Ideas of Reference:Paranoia ? ?Suicidal Thoughts:No data recorded ?Homicidal Thoughts:No data recorded ? ?Sensorium  ?Memory:Immediate Fair; Recent Fair; Remote Fair ? ?Judgment: questionable ?Insight: limited ? ?Executive Functions  ?Concentration:Fair ? ?Attention Span:Fair ? ?Recall:Fair ? ?Fund of Knowledge:Fair ? ?Language:Fair ? ? ?Psychomotor Activity  ?Psychomotor Activity:No data recorded ? ?Assets  ?Assets:Desire  for Improvement; Financial Resources/Insurance; Housing; Social Support; Talents/Skills; Vocational/Educational; Transportation; Physical Health ? ? ?Sleep  ?Sleep:No data recorded ? ? ?Physical Exam: ?Physical Exam ?ROS ?Blood pressure 122/71, pulse 76, temperature 98.2 ?F (36.8 ?C), temperature source Oral, resp. rate 18, height 5\' 10"  (1.778 m), weight 59 kg, SpO2 98 %. Body mass index is 18.66 kg/m?. ? ? ?Treatment Plan Summary: ?Daily contact with patient to assess and evaluate symptoms and progress in treatment and Medication management ? ?Patient is a 21 year old man with the above-stated past  psychiatric history who is seen in follow-up.  Chart reviewed. Patient discussed with nursing. ?Patient appears calm and able to participate in meaningful conversation, that appears to be a positive effect of Seroquel, started two days ago. Will continue current medicine without changes today.  ?  ?Plan: ? ?-continue inpatient psych admission; 15-minute checks; daily contact with patient to assess and evaluate symptoms and progress in treatment; psychoeducation. ? ?-continue scheduled medications: ? benztropine  0.5 mg Oral BID  ? gabapentin  100 mg Oral TID  ? QUEtiapine  200 mg Oral QHS  ? ? ?-continue PRN medications. ? acetaminophen, alum & mag hydroxide-simeth, haloperidol **OR** haloperidol lactate, hydrOXYzine, LORazepam **OR** LORazepam, magnesium hydroxide, traZODone ? ?-Pertinent Labs: no new labs ordered today ?   ?-Consults: No new consults placed since yesterday  ?  ?-Disposition: Estimated duration of hospitalization: hopefully sometime next week. All necessary aftercare will be arranged prior to discharge Likely d/c home with outpatient psych follow-up. ? ?-  I certify that the patient does need, on a daily basis, active treatment furnished directly by or requiring the supervision of inpatient psychiatric facility personnel.   ? ?Thalia Party, MD ?02/18/2022, 9:12 AM ? ?

## 2022-02-18 NOTE — Progress Notes (Signed)
Patient has been observed in the dayroom multiple times throughout the day. He has not voiced any further questions or concerns to this Clinical research associate. Patient remains safe on the unit. ?

## 2022-02-19 MED ORDER — QUETIAPINE FUMARATE 200 MG PO TABS
300.0000 mg | ORAL_TABLET | Freq: Every day | ORAL | Status: DC
  Administered 2022-02-19: 300 mg via ORAL
  Filled 2022-02-19: qty 1

## 2022-02-19 NOTE — Progress Notes (Signed)
The patient was visible in the milieu and he has been cooperative with treatment. He paces a lot but he denied needing any further medication. He denies SI, HI & AVH.   ?  ?  ?  ? ?

## 2022-02-19 NOTE — Plan of Care (Signed)
?  Problem: Safety: ?Goal: Periods of time without injury will increase ?Outcome: Progressing ?  ?Problem: Education: ?Goal: Will be free of psychotic symptoms ?Outcome: Progressing ?Goal: Knowledge of the prescribed therapeutic regimen will improve ?Outcome: Progressing ?  ?Problem: Safety: ?Goal: Ability to redirect hostility and anger into socially appropriate behaviors will improve ?Outcome: Progressing ?  ?

## 2022-02-19 NOTE — Progress Notes (Signed)
[facility] MD Progress Note ? ?02/19/2022 3:28 PM ?Christian Bond  ?MRN:  [id number] ?Subjective: Follow-up 21 year old man with psychosis.  Over the weekend he seems to have improved significantly.  Affect is more appropriate and he is more emotionally engaging.  He came to me wanting to discuss diagnosis.  He says he no longer thinks he believes in the devil or worships the devil.  His discussion of it is quite a bit more rational than before.  Tolerating medication okay.  I spoke with his mother on the phone and she said that he is still seemed a little odd and distant when she talked with him but there seemed to be an agreement among the family that he was a little better this weekend.  Denies any suicidal or homicidal ideation ?Principal Problem: Paranoid schizophrenia (HCC) ?Diagnosis: Principal Problem: ?  Paranoid schizophrenia (HCC) ?Active Problems: ?  Psychosis (HCC) ? ?Total Time spent with patient: 30 minutes ? ?Past Psychiatric History: Past history of worsening mental health problems over the last year with development of what seems like probably chronic psychotic symptoms ? ?Past Medical History:  ?Past Medical History:  ?Diagnosis Date  ? ADHD (attention deficit hyperactivity disorder)   ? Allergy   ?  ?Past Surgical History:  ?Procedure Laterality Date  ? TONSILLECTOMY AND ADENOIDECTOMY    ? ?Family History:  ?Family History  ?Problem Relation Age of Onset  ? Colon polyps Mother   ? Stroke Maternal Uncle   ? Heart disease Maternal Grandfather   ? Hyperlipidemia Maternal Grandfather   ? Stroke Maternal Grandfather   ? ?Family Psychiatric  History: See previous ?Social History:  ?Social History  ? ?Substance and Sexual Activity  ?Alcohol Use No  ?   ?Social History  ? ?Substance and Sexual Activity  ?Drug Use Yes  ? Types: Marijuana  ?  ?Social History  ? ?Socioeconomic History  ? Marital status: Single  ?  Spouse name: Not on file  ? Number of children: Not on file  ? Years of education: Not on file  ?  Highest education level: Not on file  ?Occupational History  ? Not on file  ?Tobacco Use  ? Smoking status: Every Day  ?  Packs/day: 1.00  ?  Types: Cigarettes  ?  Passive exposure: Never  ? Smokeless tobacco: Never  ?Vaping Use  ? Vaping Use: Some days  ?Substance and Sexual Activity  ? Alcohol use: No  ? Drug use: Yes  ?  Types: Marijuana  ? Sexual activity: Not on file  ?Other Topics Concern  ? Not on file  ?Social History Narrative  ? Not on file  ? ?Social Determinants of Health  ? ?Financial Resource Strain: Not on file  ?Food Insecurity: Not on file  ?Transportation Needs: Not on file  ?Physical Activity: Not on file  ?Stress: Not on file  ?Social Connections: Not on file  ? ?Additional Social History:  ?  ?  ?  ?  ?  ?  ?  ?  ?  ?  ?  ? ?Sleep: Fair ? ?Appetite:  Fair ? ?Current Medications: ?Current Facility-Administered Medications  ?Medication Dose Route Frequency Provider Last Rate Last Admin  ? acetaminophen (TYLENOL) tablet 650 mg  650 mg Oral Q6H PRN Vanetta Mulders, NP      ? alum & mag hydroxide-simeth (MAALOX/MYLANTA) 200-200-20 MG/5ML suspension 30 mL  30 mL Oral Q4H PRN Vanetta Mulders, NP      ? benztropine (COGENTIN) tablet  0.5 mg  0.5 mg Oral BID Aneisa Karren, Jackquline Denmark, MD   0.5 mg at 02/19/22 0751  ? gabapentin (NEURONTIN) capsule 100 mg  100 mg Oral TID Charm Rings, NP   100 mg at 02/19/22 1158  ? haloperidol (HALDOL) tablet 5 mg  5 mg Oral Q6H PRN Mettie Roylance, Jackquline Denmark, MD   5 mg at 02/15/22 1701  ? Or  ? haloperidol lactate (HALDOL) injection 5 mg  5 mg Intramuscular Q6H PRN Brazos Sandoval, Jackquline Denmark, MD   5 mg at 02/16/22 3149  ? hydrOXYzine (ATARAX) tablet 25 mg  25 mg Oral TID PRN Vanetta Mulders, NP   25 mg at 02/16/22 7026  ? LORazepam (ATIVAN) tablet 2 mg  2 mg Oral Q6H PRN Malyah Ohlrich, Jackquline Denmark, MD   2 mg at 02/15/22 1701  ? Or  ? LORazepam (ATIVAN) injection 2 mg  2 mg Intramuscular Q6H PRN Cailynn Bodnar T, MD   2 mg at 02/16/22 3785  ? magnesium hydroxide (MILK OF MAGNESIA) suspension 30 mL  30  mL Oral Daily PRN Gabriel Cirri F, NP      ? QUEtiapine (SEROQUEL) tablet 300 mg  300 mg Oral QHS Selwyn Reason T, MD      ? traZODone (DESYREL) tablet 50 mg  50 mg Oral QHS PRN Vanetta Mulders, NP   50 mg at 02/18/22 2115  ? ? ?Lab Results: No results found for this or any previous visit (from the past 48 hour(s)). ? ?Blood Alcohol level:  ?Lab Results  ?Component Value Date  ? ETH <10 02/11/2022  ? ETH <10 09/29/2021  ? ? ?Metabolic Disorder Labs: ?Lab Results  ?Component Value Date  ? HGBA1C 5.3 10/04/2021  ? MPG 105.41 10/04/2021  ? MPG 96.8 12/22/2020  ? ?No results found for: PROLACTIN ?Lab Results  ?Component Value Date  ? CHOL 146 10/04/2021  ? TRIG 78 10/04/2021  ? HDL 49 10/04/2021  ? CHOLHDL 3.0 10/04/2021  ? VLDL 16 10/04/2021  ? LDLCALC 81 10/04/2021  ? LDLCALC 160 (H) 12/22/2020  ? ? ?Physical Findings: ?AIMS:  , ,  ,  ,    ?CIWA:    ?COWS:    ? ?Musculoskeletal: ?Strength & Muscle Tone: within normal limits ?Gait & Station: normal ?Patient leans: N/A ? ?Psychiatric Specialty Exam: ? ?Presentation  ?General Appearance: Fairly Groomed ? ?Eye Contact:Fleeting ? ?Speech:Blocked ? ?Speech Volume:Normal ? ?Handedness:Right ? ? ?Mood and Affect  ?Mood:Anxious ? ?Affect:Congruent ? ? ?Thought Process  ?Thought Processes:Disorganized ? ?Descriptions of Associations:Intact ? ?Orientation:Full (Time, Place and Person) ? ?Thought Content:Paranoid Ideation ? ?History of Schizophrenia/Schizoaffective disorder:Yes ? ?Duration of Psychotic Symptoms:Greater than six months ? ?Hallucinations:No data recorded ?Ideas of Reference:Paranoia ? ?Suicidal Thoughts:No data recorded ?Homicidal Thoughts:No data recorded ? ?Sensorium  ?Memory:Immediate Fair; Recent Fair; Remote Fair ? ?Judgment:Impaired ? ?Insight:Lacking ? ? ?Executive Functions  ?Concentration:Fair ? ?Attention Span:Fair ? ?Recall:Fair ? ?Fund of Knowledge:Fair ? ?Language:Fair ? ? ?Psychomotor Activity  ?Psychomotor Activity:No data recorded ? ?Assets   ?Assets:Desire for Improvement; Financial Resources/Insurance; Housing; Social Support; Talents/Skills; Vocational/Educational; Transportation; Physical Health ? ? ?Sleep  ?Sleep:No data recorded ? ? ?Physical Exam: ?Physical Exam ?Vitals and nursing note reviewed.  ?Constitutional:   ?   Appearance: Normal appearance.  ?HENT:  ?   Head: Normocephalic and atraumatic.  ?   Mouth/Throat:  ?   Pharynx: Oropharynx is clear.  ?Eyes:  ?   Pupils: Pupils are equal, round, and reactive to light.  ?Cardiovascular:  ?  Rate and Rhythm: Normal rate and regular rhythm.  ?Pulmonary:  ?   Effort: Pulmonary effort is normal.  ?   Breath sounds: Normal breath sounds.  ?Abdominal:  ?   General: Abdomen is flat.  ?   Palpations: Abdomen is soft.  ?Musculoskeletal:     ?   General: Normal range of motion.  ?Skin: ?   General: Skin is warm and dry.  ?Neurological:  ?   General: No focal deficit present.  ?   Mental Status: He is alert. Mental status is at baseline.  ?Psychiatric:     ?   Attention and Perception: Attention normal.     ?   Mood and Affect: Mood normal.     ?   Speech: Speech normal.     ?   Behavior: Behavior normal.     ?   Thought Content: Thought content normal.     ?   Cognition and Memory: Cognition normal.  ? ?Review of Systems  ?Constitutional: Negative.   ?HENT: Negative.    ?Eyes: Negative.   ?Respiratory: Negative.    ?Cardiovascular: Negative.   ?Gastrointestinal: Negative.   ?Musculoskeletal: Negative.   ?Skin: Negative.   ?Neurological: Negative.   ?Psychiatric/Behavioral:  Positive for memory loss. Negative for depression, hallucinations, substance abuse and suicidal ideas. The patient is nervous/anxious.   ?Blood pressure 103/62, pulse 75, temperature 97.6 ?F (36.4 ?C), temperature source Oral, resp. rate 18, height  (1.778 m), weight 59 kg, SpO2 97 %. Body mass index is 18.66 kg/m?. ? ? ?Treatment Plan Summary: ?Medication management and Plan patient seems improved since being on Seroquel.  I am  going to increase the dose a little more at like to try to get it up to at least 3 or 400 mg at night.  Spoke with his mother today.  We are working on trying to find a appropriate outpatient follow-up plan

## 2022-02-19 NOTE — BH IP Treatment Plan (Signed)
Interdisciplinary Treatment and Diagnostic Plan Update ? ?02/19/2022 ?Time of Session: 0830 ?Christian HurtWilliam D Bond ?MRN: [id number] ? ?Principal Diagnosis: Paranoid schizophrenia (HCC) ? ?Secondary Diagnoses: Principal Problem: ?  Paranoid schizophrenia (HCC) ?Active Problems: ?  Psychosis (HCC) ? ? ?Current Medications:  ?Current Facility-Administered Medications  ?Medication Dose Route Frequency Provider Last Rate Last Admin  ? acetaminophen (TYLENOL) tablet 650 mg  650 mg Oral Q6H PRN Vanetta MuldersBarthold, Louise F, NP      ? alum & mag hydroxide-simeth (MAALOX/MYLANTA) 200-200-20 MG/5ML suspension 30 mL  30 mL Oral Q4H PRN Gabriel CirriBarthold, Louise F, NP      ? benztropine (COGENTIN) tablet 0.5 mg  0.5 mg Oral BID Clapacs, Jackquline DenmarkJohn T, MD   0.5 mg at 02/19/22 0751  ? gabapentin (NEURONTIN) capsule 100 mg  100 mg Oral TID Charm RingsLord, Jamison Y, NP   100 mg at 02/19/22 0751  ? haloperidol (HALDOL) tablet 5 mg  5 mg Oral Q6H PRN Clapacs, Jackquline DenmarkJohn T, MD   5 mg at 02/15/22 1701  ? Or  ? haloperidol lactate (HALDOL) injection 5 mg  5 mg Intramuscular Q6H PRN Clapacs, Jackquline DenmarkJohn T, MD   5 mg at 02/16/22 [id number]  ? hydrOXYzine (ATARAX) tablet 25 mg  25 mg Oral TID PRN Vanetta MuldersBarthold, Louise F, NP   25 mg at 02/16/22 [id number]  ? LORazepam (ATIVAN) tablet 2 mg  2 mg Oral Q6H PRN Clapacs, Jackquline DenmarkJohn T, MD   2 mg at 02/15/22 1701  ? Or  ? LORazepam (ATIVAN) injection 2 mg  2 mg Intramuscular Q6H PRN Clapacs, John T, MD   2 mg at 02/16/22 [id number]  ? magnesium hydroxide (MILK OF MAGNESIA) suspension 30 mL  30 mL Oral Daily PRN Gabriel CirriBarthold, Louise F, NP      ? QUEtiapine (SEROQUEL) tablet 200 mg  200 mg Oral QHS Clapacs, John T, MD   200 mg at 02/18/22 2115  ? traZODone (DESYREL) tablet 50 mg  50 mg Oral QHS PRN Vanetta MuldersBarthold, Louise F, NP   50 mg at 02/18/22 2115  ? ?PTA Medications: ?Medications Prior to Admission  ?Medication Sig Dispense Refill Last Dose  ? cyproheptadine (PERIACTIN) 4 MG tablet Take 1 tablet (4 mg total) by mouth every 8 (eight) hours. 90 tablet 3   ? diazepam (VALIUM) 5 MG tablet  Take 1 tablet (5 mg total) by mouth once as needed for up to 1 dose (take 45 minutes before procedure). (Patient not taking: Reported on 02/11/2022) 1 tablet 0   ? divalproex (DEPAKOTE) 500 MG DR tablet Take 1 tablet (500 mg total) by mouth at bedtime 30 tablet 0   ? DULoxetine (CYMBALTA) 20 MG capsule Take 1 capsule (20 mg total) by mouth daily. 30 capsule 3   ? escitalopram (LEXAPRO) 20 MG tablet Take 0.5 tablet by mouth for 1 week, then increase to 1 tablet by mouth daily (Patient not taking: Reported on 02/11/2022) 30 tablet 2   ? fluticasone (FLONASE) 50 MCG/ACT nasal spray Place 2 sprays into both nostrils daily. (Patient not taking: Reported on 02/11/2022) 16 g 1   ? gabapentin (NEURONTIN) 100 MG capsule Take 1 capsule (100 mg total) by mouth 3 (three) times daily. 90 capsule 3   ? hydrOXYzine (ATARAX) 25 MG tablet Take 1 tablet (25 mg total) by mouth 3 (three) times daily as needed for anxiety. 90 tablet 3   ? QUEtiapine (SEROQUEL XR) 300 MG 24 hr tablet Take 1 tablet (300 mg total) by mouth at bedtime. 30 tablet 3   ? ? ?  Patient Stressors: Marital or family conflict   ?Medication change or noncompliance   ?Substance abuse   ? ?Patient Strengths: Ability for insight  ?Motivation for treatment/growth  ?Supportive family/friends  ? ?Treatment Modalities: Medication Management, Group therapy, Case management,  ?1 to 1 session with clinician, Psychoeducation, Recreational therapy. ? ? ?Physician Treatment Plan for Primary Diagnosis: Paranoid schizophrenia (HCC) ?Long Term Goal(s): Improvement in symptoms so as ready for discharge  ? ?Short Term Goals: Ability to identify changes in lifestyle to reduce recurrence of condition will improve ?Ability to identify triggers associated with substance abuse/mental health issues will improve ?Ability to verbalize feelings will improve ?Ability to demonstrate self-control will improve ?Ability to identify and develop effective coping behaviors will improve ? ?Medication  Management: Evaluate patient's response, side effects, and tolerance of medication regimen. ? ?Therapeutic Interventions: 1 to 1 sessions, Unit Group sessions and Medication administration. ? ?Evaluation of Outcomes: Progressing ? ?Physician Treatment Plan for Secondary Diagnosis: Principal Problem: ?  Paranoid schizophrenia (HCC) ?Active Problems: ?  Psychosis (HCC) ? ?Long Term Goal(s): Improvement in symptoms so as ready for discharge  ? ?Short Term Goals: Ability to identify changes in lifestyle to reduce recurrence of condition will improve ?Ability to identify triggers associated with substance abuse/mental health issues will improve ?Ability to verbalize feelings will improve ?Ability to demonstrate self-control will improve ?Ability to identify and develop effective coping behaviors will improve    ? ?Medication Management: Evaluate patient's response, side effects, and tolerance of medication regimen. ? ?Therapeutic Interventions: 1 to 1 sessions, Unit Group sessions and Medication administration. ? ?Evaluation of Outcomes: Progressing ? ? ?RN Treatment Plan for Primary Diagnosis: Paranoid schizophrenia (HCC) ?Long Term Goal(s): Knowledge of disease and therapeutic regimen to maintain health will improve ? ?Short Term Goals: Ability to remain free from injury will improve, Ability to verbalize frustration and anger appropriately will improve, Ability to demonstrate self-control, Ability to participate in decision making will improve, Ability to verbalize feelings will improve, Ability to disclose and discuss suicidal ideas, Ability to identify and develop effective coping behaviors will improve, and Compliance with prescribed medications will improve ? ?Medication Management: RN will administer medications as ordered by provider, will assess and evaluate patient's response and provide education to patient for prescribed medication. RN will report any adverse and/or side effects to prescribing  provider. ? ?Therapeutic Interventions: 1 on 1 counseling sessions, Psychoeducation, Medication administration, Evaluate responses to treatment, Monitor vital signs and CBGs as ordered, Perform/monitor CIWA, COWS, AIMS and Fall Risk screenings as ordered, Perform wound care treatments as ordered. ? ?Evaluation of Outcomes: Progressing ? ? ?LCSW Treatment Plan for Primary Diagnosis: Paranoid schizophrenia (HCC) ?Long Term Goal(s): Safe transition to appropriate next level of care at discharge, Engage patient in therapeutic group addressing interpersonal concerns. ? ?Short Term Goals: Engage patient in aftercare planning with referrals and resources, Increase social support, Increase ability to appropriately verbalize feelings, Increase emotional regulation, Facilitate acceptance of mental health diagnosis and concerns, Facilitate patient progression through stages of change regarding substance use diagnoses and concerns, Identify triggers associated with mental health/substance abuse issues, and Increase skills for wellness and recovery ? ?Therapeutic Interventions: Assess for all discharge needs, 1 to 1 time with Child psychotherapist, Explore available resources and support systems, Assess for adequacy in community support network, Educate family and significant other(s) on suicide prevention, Complete Psychosocial Assessment, Interpersonal group therapy. ? ?Evaluation of Outcomes: Progressing ? ? ?Progress in Treatment: ?Attending groups: Yes. ?Participating in groups: Yes. ?Taking medication as prescribed: Yes. ?  Toleration medication: Yes. ?Family/Significant other contact made: No, will contact:  Patient has declined consent for CSW to reach collateral  contact.  ?Patient understands diagnosis: No. ?Discussing patient identified problems/goals with staff: No. ?Medical problems stabilized or resolved: Yes. ?Denies suicidal/homicidal ideation: Yes. ?Issues/concerns per patient self-inventory: Yes. ?Other: none ? ?New  problem(s) identified: No, Describe:  No additional problems/concerns identified at this time. Patient decompensated earlier in the week, however, has since improved.  ? ?New Short Term/Long Term Goal(s): Patient to work tow

## 2022-02-19 NOTE — Progress Notes (Signed)
Recreation Therapy Notes ? ?Date: 02/19/2022 ? ?Time: 10:40am  ? ?Location: Craft room      ? ?Behavioral response: N/A ?  ?Intervention Topic: Time Management   ? ?Discussion/Intervention: ?Patient refused to attend group.  ? ?Clinical Observations/Feedback:  ?Patient refused to attend group.  ?  ?Christian Bond LRT/CTRS ? ? ? ? ? ? ? ?Christian Bond ?02/19/2022 12:01 PM ?

## 2022-02-19 NOTE — Plan of Care (Signed)
Patient visible in the milieu. Pleasant and cooperative on approach. Patient wrote all over the bathroom wall with crayons. Patient states " that was a crazy moment I did this." Patient apologized for his action and tried to clean it up with wash cloth. Patient states " I feel much clear now. My goal is to get a job. I like arts and I like to help people."  Patient denies SI,HI and AVH. Compliant with medications. Appetite and energy level good. Support and encouragement given. ?

## 2022-02-19 NOTE — Group Note (Signed)
LCSW Group Therapy Note ? ?Group Date: 02/19/2022 ?Start Time: 1300 ?End Time: 1400 ? ? ?Type of Therapy and Topic:  Group Therapy - How To Cope with Nervousness about Discharge  ? ?Participation Level:  Did Not Attend  ? ?Description of Group ?This process group involved identification of patients' feelings about discharge. Some of them are scheduled to be discharged soon, while others are new admissions, but each of them was asked to share thoughts and feelings surrounding discharge from the hospital. One common theme was that they are excited at the prospect of going home, while another was that many of them are apprehensive about sharing why they were hospitalized. Patients were given the opportunity to discuss these feelings with their peers in preparation for discharge. ? ?Therapeutic Goals ? ?Patient will identify their overall feelings about pending discharge. ?Patient will think about how they might proactively address issues that they believe will once again arise once they get home (i.e. with parents). ?Patients will participate in discussion about having hope for change. ? ? ?Summary of Patient Progress:  Patient did not attend group despite encouraged participation.  ? ? ?Therapeutic Modalities ?Cognitive Behavioral Therapy ? ? ?Corky Crafts, LCSWA ?02/19/2022  3:33 PM   ?

## 2022-02-20 MED ORDER — QUETIAPINE FUMARATE 200 MG PO TABS
400.0000 mg | ORAL_TABLET | Freq: Every day | ORAL | Status: DC
  Administered 2022-02-20 – 2022-02-21 (×2): 400 mg via ORAL
  Filled 2022-02-20 (×2): qty 2

## 2022-02-20 NOTE — Progress Notes (Signed)
[facility] MD Progress Note ? ?02/20/2022 3:31 PM ?Christian Bond  ?MRN:  [id number] ?Subjective: Follow-up 21 year old man with what appears to now be schizophrenia.  Patient continues to look improved.  He has not been agitated or aggressive today.  He does do some pacing but is able to sit down and stay in place for a conversation without difficulty.  He has not shown any further bizarre behavior the last couple days.  Sleeping better. ?Principal Problem: Paranoid schizophrenia (HCC) ?Diagnosis: Principal Problem: ?  Paranoid schizophrenia (HCC) ?Active Problems: ?  Psychosis (HCC) ? ?Total Time spent with patient: 30 minutes ? ?Past Psychiatric History: Past history of several prior hospitalizations with gradually more pronounced psychotic symptoms ? ?Past Medical History:  ?Past Medical History:  ?Diagnosis Date  ? ADHD (attention deficit hyperactivity disorder)   ? Allergy   ?  ?Past Surgical History:  ?Procedure Laterality Date  ? TONSILLECTOMY AND ADENOIDECTOMY    ? ?Family History:  ?Family History  ?Problem Relation Age of Onset  ? Colon polyps Mother   ? Stroke Maternal Uncle   ? Heart disease Maternal Grandfather   ? Hyperlipidemia Maternal Grandfather   ? Stroke Maternal Grandfather   ? ?Family Psychiatric  History: See previous ?Social History:  ?Social History  ? ?Substance and Sexual Activity  ?Alcohol Use No  ?   ?Social History  ? ?Substance and Sexual Activity  ?Drug Use Yes  ? Types: Marijuana  ?  ?Social History  ? ?Socioeconomic History  ? Marital status: Single  ?  Spouse name: Not on file  ? Number of children: Not on file  ? Years of education: Not on file  ? Highest education level: Not on file  ?Occupational History  ? Not on file  ?Tobacco Use  ? Smoking status: Every Day  ?  Packs/day: 1.00  ?  Types: Cigarettes  ?  Passive exposure: Never  ? Smokeless tobacco: Never  ?Vaping Use  ? Vaping Use: Some days  ?Substance and Sexual Activity  ? Alcohol use: No  ? Drug use: Yes  ?  Types: Marijuana  ?  Sexual activity: Not on file  ?Other Topics Concern  ? Not on file  ?Social History Narrative  ? Not on file  ? ?Social Determinants of Health  ? ?Financial Resource Strain: Not on file  ?Food Insecurity: Not on file  ?Transportation Needs: Not on file  ?Physical Activity: Not on file  ?Stress: Not on file  ?Social Connections: Not on file  ? ?Additional Social History:  ?  ?  ?  ?  ?  ?  ?  ?  ?  ?  ?  ? ?Sleep: Fair ? ?Appetite:  Fair ? ?Current Medications: ?Current Facility-Administered Medications  ?Medication Dose Route Frequency Provider Last Rate Last Admin  ? acetaminophen (TYLENOL) tablet 650 mg  650 mg Oral Q6H PRN Vanetta Mulders, NP      ? alum & mag hydroxide-simeth (MAALOX/MYLANTA) 200-200-20 MG/5ML suspension 30 mL  30 mL Oral Q4H PRN Gabriel Cirri F, NP      ? benztropine (COGENTIN) tablet 0.5 mg  0.5 mg Oral BID Libi Corso T, MD   0.5 mg at 02/20/22 0630  ? gabapentin (NEURONTIN) capsule 100 mg  100 mg Oral TID Charm Rings, NP   100 mg at 02/20/22 1204  ? haloperidol (HALDOL) tablet 5 mg  5 mg Oral Q6H PRN Advika Mclelland, Jackquline Denmark, MD   5 mg at 02/15/22 1701  ?  Or  ? haloperidol lactate (HALDOL) injection 5 mg  5 mg Intramuscular Q6H PRN Edker Punt, Jackquline DenmarkJohn T, MD   5 mg at 02/16/22 [id number]  ? hydrOXYzine (ATARAX) tablet 25 mg  25 mg Oral TID PRN Vanetta MuldersBarthold, Louise F, NP   25 mg at 02/19/22 2109  ? LORazepam (ATIVAN) tablet 2 mg  2 mg Oral Q6H PRN Caelen Reierson, Jackquline DenmarkJohn T, MD   2 mg at 02/15/22 1701  ? Or  ? LORazepam (ATIVAN) injection 2 mg  2 mg Intramuscular Q6H PRN Brynnlie Unterreiner T, MD   2 mg at 02/16/22 [id number]  ? magnesium hydroxide (MILK OF MAGNESIA) suspension 30 mL  30 mL Oral Daily PRN Gabriel CirriBarthold, Louise F, NP      ? QUEtiapine (SEROQUEL) tablet 400 mg  400 mg Oral QHS Berkleigh Beckles T, MD      ? traZODone (DESYREL) tablet 50 mg  50 mg Oral QHS PRN Vanetta MuldersBarthold, Louise F, NP   50 mg at 02/19/22 2109  ? ? ?Lab Results: No results found for this or any previous visit (from the past 48 hour(s)). ? ?Blood Alcohol level:   ?Lab Results  ?Component Value Date  ? ETH <10 02/11/2022  ? ETH <10 09/29/2021  ? ? ?Metabolic Disorder Labs: ?Lab Results  ?Component Value Date  ? HGBA1C 5.3 10/04/2021  ? MPG 105.41 10/04/2021  ? MPG 96.8 12/22/2020  ? ?No results found for: PROLACTIN ?Lab Results  ?Component Value Date  ? CHOL 146 10/04/2021  ? TRIG 78 10/04/2021  ? HDL 49 10/04/2021  ? CHOLHDL 3.0 10/04/2021  ? VLDL 16 10/04/2021  ? LDLCALC 81 10/04/2021  ? LDLCALC 160 (H) 12/22/2020  ? ? ?Physical Findings: ?AIMS:  , ,  ,  ,    ?CIWA:    ?COWS:    ? ?Musculoskeletal: ?Strength & Muscle Tone: within normal limits ?Gait & Station: normal ?Patient leans: N/A ? ?Psychiatric Specialty Exam: ? ?Presentation  ?General Appearance: Fairly Groomed ? ?Eye Contact:Fleeting ? ?Speech:Blocked ? ?Speech Volume:Normal ? ?Handedness:Right ? ? ?Mood and Affect  ?Mood:Anxious ? ?Affect:Congruent ? ? ?Thought Process  ?Thought Processes:Disorganized ? ?Descriptions of Associations:Intact ? ?Orientation:Full (Time, Place and Person) ? ?Thought Content:Paranoid Ideation ? ?History of Schizophrenia/Schizoaffective disorder:Yes ? ?Duration of Psychotic Symptoms:Greater than six months ? ?Hallucinations:No data recorded ?Ideas of Reference:Paranoia ? ?Suicidal Thoughts:No data recorded ?Homicidal Thoughts:No data recorded ? ?Sensorium  ?Memory:Immediate Fair; Recent Fair; Remote Fair ? ?Judgment:Impaired ? ?Insight:Lacking ? ? ?Executive Functions  ?Concentration:Fair ? ?Attention Span:Fair ? ?Recall:Fair ? ?Fund of Knowledge:Fair ? ?Language:Fair ? ? ?Psychomotor Activity  ?Psychomotor Activity:No data recorded ? ?Assets  ?Assets:Desire for Improvement; Financial Resources/Insurance; Housing; Social Support; Talents/Skills; Vocational/Educational; Transportation; Physical Health ? ? ?Sleep  ?Sleep:No data recorded ? ? ?Physical Exam: ?Physical Exam ?Vitals and nursing note reviewed.  ?Constitutional:   ?   Appearance: Normal appearance.  ?HENT:  ?   Head:  Normocephalic and atraumatic.  ?   Mouth/Throat:  ?   Pharynx: Oropharynx is clear.  ?Eyes:  ?   Pupils: Pupils are equal, round, and reactive to light.  ?Cardiovascular:  ?   Rate and Rhythm: Normal rate and regular rhythm.  ?Pulmonary:  ?   Effort: Pulmonary effort is normal.  ?   Breath sounds: Normal breath sounds.  ?Abdominal:  ?   General: Abdomen is flat.  ?   Palpations: Abdomen is soft.  ?Musculoskeletal:     ?   General: Normal range of motion.  ?Skin: ?   General:  Skin is warm and dry.  ?Neurological:  ?   General: No focal deficit present.  ?   Mental Status: He is alert. Mental status is at baseline.  ?Psychiatric:     ?   Mood and Affect: Mood normal.     ?   Thought Content: Thought content normal.  ? ?Review of Systems  ?Constitutional: Negative.   ?HENT: Negative.    ?Eyes: Negative.   ?Respiratory: Negative.    ?Cardiovascular: Negative.   ?Gastrointestinal: Negative.   ?Musculoskeletal: Negative.   ?Skin: Negative.   ?Neurological: Negative.   ?Psychiatric/Behavioral: Negative.    ?Blood pressure 105/66, pulse 64, temperature 97.9 ?F (36.6 ?C), temperature source Oral, resp. rate 17, height 5\' 10"  (1.778 m), weight 59 kg, SpO2 100 %. Body mass index is 18.66 kg/m?. ? ? ?Treatment Plan Summary: ?Medication management and Plan Seroquel will be dosed at 400 mg at night for psychosis.  Patient is tolerating medicine well.  Last night he was up still a fair bit of the early morning hours despite the 300 mg so I imagine he can tolerate the 400.  I have sent in a referral to the [facility] program at [facility].  I think he would be a very good candidate for that outpatient treatment.  I talked with him about how we are trying to make sure that he is getting good outpatient treatment set up prior to discharge. ? ?[facility], MD ?02/20/2022, 3:31 PM ? ?

## 2022-02-20 NOTE — Plan of Care (Signed)
D- Patient alert and oriented. Patient presents in a pleasant mood on assessment, reporting that he slept good last night and had no complaints to voice to this Clinical research associate. Patient denies SI, HI, AVH, and pain at this time. Patient also denies any signs/symptoms of depression/anxiety, stating that overall, "I feel just fine". Patient's goal for today again is to "discharge", in which he will "confer with Dr. Toni Amend", in order to achieve his goal. ? ?A- Scheduled medications administered to patient, per MD orders. Support and encouragement provided.  Routine safety checks conducted every 15 minutes.  Patient informed to notify staff with problems or concerns. ? ?R- No adverse drug reactions noted. Patient contracts for safety at this time. Patient compliant with medications and treatment plan. Patient receptive, calm, and cooperative. Patient interacts well with others on the unit. Patient remains safe at this time. ? ?Problem: Education: ?Goal: Knowledge of [facility] Education information/materials will improve ?Outcome: Progressing ?Goal: Emotional status will improve ?Outcome: Progressing ?Goal: Mental status will improve ?Outcome: Progressing ?Goal: Verbalization of understanding the information provided will improve ?Outcome: Progressing ?  ?Problem: Safety: ?Goal: Periods of time without injury will increase ?Outcome: Progressing ?  ?Problem: Education: ?Goal: Will be free of psychotic symptoms ?Outcome: Progressing ?Goal: Knowledge of the prescribed therapeutic regimen will improve ?Outcome: Progressing ?  ?Problem: Safety: ?Goal: Ability to redirect hostility and anger into socially appropriate behaviors will improve ?Outcome: Progressing ?Goal: Ability to remain free from injury will improve ?Outcome: Progressing ?  ?

## 2022-02-20 NOTE — Group Note (Cosign Needed)
[facility] LCSW Group Therapy Note ? ? ?Group Date: 02/20/2022 ?Start Time: 1300 ?End Time: 1400 ? ? ?Type of Therapy/Topic:  Group Therapy:  Balance in Life ? ?Participation Level:  Did Not Attend  ? ?Description of Group:   ? This group will address the concept of balance and how it feels and looks when one is unbalanced. Patients will be encouraged to process areas in their lives that are out of balance, and identify reasons for remaining unbalanced. Facilitators will guide patients utilizing problem- solving interventions to address and correct the stressor making their life unbalanced. Understanding and applying boundaries will be explored and addressed for obtaining  and maintaining a balanced life. Patients will be encouraged to explore ways to assertively make their unbalanced needs known to significant others in their lives, using other group members and facilitator for support and feedback. ? ?Therapeutic Goals: ?Patient will identify two or more emotions or situations they have that consume much of in their lives. ?Patient will identify signs/triggers that life has become out of balance:  ?Patient will identify two ways to set boundaries in order to achieve balance in their lives:  ?Patient will demonstrate ability to communicate their needs through discussion and/or role plays ? ?Summary of Patient Progress: ? ? ? ?X ? ? ? ?Therapeutic Modalities:   ?Cognitive Behavioral Therapy ?Solution-Focused Therapy ?Assertiveness Training ? ? ?Christian Bond, Student-Social Work ?

## 2022-02-20 NOTE — Progress Notes (Signed)
Recreation Therapy Notes ? ? ?Date: 02/20/2022 ? ?Time: 10:35am  ? ?Location: Courtyard   ? ?Behavioral response: Appropriate ? ?Intervention Topic:  Wellness   ? ?Discussion/Intervention:  ?Group content today was focused on Wellness. The group defined wellness and some positive ways they make decisions for themselves. Individuals expressed reasons why they neglected any wellness in the past. Patients described ways to improve wellness skills in the future. The group explained what could happen if they did not do any wellness at all. Participants express how bad choices has affected them and others around them. Individual explained the importance of wellness. The group participated in the intervention ?Testing my Wellness? where they had a chance to identify some of their weaknesses and strengths in wellness.  ?Clinical Observations/Feedback: ?Patient came to group and was focused on what peers and staff had to say about wellness. Individual was social with peers and staff while participating in the intervention.    ?Christian Bond LRT/CTRS  ? ? ? ? ? ? ? ? ?Christian Bond ?02/20/2022 12:37 PM ?

## 2022-02-20 NOTE — Progress Notes (Signed)
Pt presents with a better affect than when this writer saw this patient last week. Pt denies SI/HI/AVH. Pt observed interacting appropriately with staff and peers on the unit. Pt complaint with medication administration per MD orders. Pt given education, support, and encouragement to be active in his treatment plan. Pt being monitored Q 15 minutes for safety per unit protocol. Pt remains safe on the unit.  ?

## 2022-02-20 NOTE — Progress Notes (Signed)
Patient pleasant and cooperative. Walked halls most of early shift and throughout night.  Minimal interaction with staff, none with peers. Sleep meds given with partial relief as well as medication for anxiety.  Patient remains safe on unit with q15 min checks. ?

## 2022-02-21 NOTE — Progress Notes (Signed)
[facility] MD Progress Note ? ?02/21/2022 3:09 PM ?Christian Bond  ?MRN:  [id number] ?Subjective: Patient seen and chart reviewed.  Patient continues to show improvement.  He is coherent in his speech and has been purposeful and appropriate with his behavior.  Attending more groups and interacting appropriately with people.  Denies any suicidal thoughts.  Now denies any hallucinations. ?Principal Problem: Paranoid schizophrenia (HCC) ?Diagnosis: Principal Problem: ?  Paranoid schizophrenia (HCC) ?Active Problems: ?  Psychosis (HCC) ? ?Total Time spent with patient: 30 minutes ? ?Past Psychiatric History: Past history of now several episodes of psychotic disorder becoming more clear-cut with time ? ?Past Medical History:  ?Past Medical History:  ?Diagnosis Date  ? ADHD (attention deficit hyperactivity disorder)   ? Allergy   ?  ?Past Surgical History:  ?Procedure Laterality Date  ? TONSILLECTOMY AND ADENOIDECTOMY    ? ?Family History:  ?Family History  ?Problem Relation Age of Onset  ? Colon polyps Mother   ? Stroke Maternal Uncle   ? Heart disease Maternal Grandfather   ? Hyperlipidemia Maternal Grandfather   ? Stroke Maternal Grandfather   ? ?Family Psychiatric  History: See previous ?Social History:  ?Social History  ? ?Substance and Sexual Activity  ?Alcohol Use No  ?   ?Social History  ? ?Substance and Sexual Activity  ?Drug Use Yes  ? Types: Marijuana  ?  ?Social History  ? ?Socioeconomic History  ? Marital status: Single  ?  Spouse name: Not on file  ? Number of children: Not on file  ? Years of education: Not on file  ? Highest education level: Not on file  ?Occupational History  ? Not on file  ?Tobacco Use  ? Smoking status: Every Day  ?  Packs/day: 1.00  ?  Types: Cigarettes  ?  Passive exposure: Never  ? Smokeless tobacco: Never  ?Vaping Use  ? Vaping Use: Some days  ?Substance and Sexual Activity  ? Alcohol use: No  ? Drug use: Yes  ?  Types: Marijuana  ? Sexual activity: Not on file  ?Other Topics Concern  ?  Not on file  ?Social History Narrative  ? Not on file  ? ?Social Determinants of Health  ? ?Financial Resource Strain: Not on file  ?Food Insecurity: Not on file  ?Transportation Needs: Not on file  ?Physical Activity: Not on file  ?Stress: Not on file  ?Social Connections: Not on file  ? ?Additional Social History:  ?  ?  ?  ?  ?  ?  ?  ?  ?  ?  ?  ? ?Sleep: Fair ? ?Appetite:  Fair ? ?Current Medications: ?Current Facility-Administered Medications  ?Medication Dose Route Frequency Provider Last Rate Last Admin  ? acetaminophen (TYLENOL) tablet 650 mg  650 mg Oral Q6H PRN Vanetta Mulders, NP      ? alum & mag hydroxide-simeth (MAALOX/MYLANTA) 200-200-20 MG/5ML suspension 30 mL  30 mL Oral Q4H PRN Gabriel Cirri F, NP      ? benztropine (COGENTIN) tablet 0.5 mg  0.5 mg Oral BID Bettylou Frew T, MD   0.5 mg at 02/21/22 6962  ? gabapentin (NEURONTIN) capsule 100 mg  100 mg Oral TID Charm Rings, NP   100 mg at 02/21/22 1223  ? haloperidol (HALDOL) tablet 5 mg  5 mg Oral Q6H PRN Mertice Uffelman, Jackquline Denmark, MD   5 mg at 02/15/22 1701  ? Or  ? haloperidol lactate (HALDOL) injection 5 mg  5 mg Intramuscular Q6H  PRN Wrangler Penning, Jackquline DenmarkJohn T, MD   5 mg at 02/16/22 [id number]  ? hydrOXYzine (ATARAX) tablet 25 mg  25 mg Oral TID PRN Vanetta MuldersBarthold, Louise F, NP   25 mg at 02/19/22 2109  ? LORazepam (ATIVAN) tablet 2 mg  2 mg Oral Q6H PRN Terina Mcelhinny, Jackquline DenmarkJohn T, MD   2 mg at 02/15/22 1701  ? Or  ? LORazepam (ATIVAN) injection 2 mg  2 mg Intramuscular Q6H PRN Leoncio Hansen T, MD   2 mg at 02/16/22 [id number]  ? magnesium hydroxide (MILK OF MAGNESIA) suspension 30 mL  30 mL Oral Daily PRN Gabriel CirriBarthold, Louise F, NP      ? QUEtiapine (SEROQUEL) tablet 400 mg  400 mg Oral QHS Laylee Schooley, Jackquline DenmarkJohn T, MD   400 mg at 02/20/22 2110  ? traZODone (DESYREL) tablet 50 mg  50 mg Oral QHS PRN Vanetta MuldersBarthold, Louise F, NP   50 mg at 02/19/22 2109  ? ? ?Lab Results: No results found for this or any previous visit (from the past 48 hour(s)). ? ?Blood Alcohol level:  ?Lab Results  ?Component Value  Date  ? ETH <10 02/11/2022  ? ETH <10 09/29/2021  ? ? ?Metabolic Disorder Labs: ?Lab Results  ?Component Value Date  ? HGBA1C 5.3 10/04/2021  ? MPG 105.41 10/04/2021  ? MPG 96.8 12/22/2020  ? ?No results found for: PROLACTIN ?Lab Results  ?Component Value Date  ? CHOL 146 10/04/2021  ? TRIG 78 10/04/2021  ? HDL 49 10/04/2021  ? CHOLHDL 3.0 10/04/2021  ? VLDL 16 10/04/2021  ? LDLCALC 81 10/04/2021  ? LDLCALC 160 (H) 12/22/2020  ? ? ?Physical Findings: ?AIMS:  , ,  ,  ,    ?CIWA:    ?COWS:    ? ?Musculoskeletal: ?Strength & Muscle Tone: within normal limits ?Gait & Station: normal ?Patient leans: N/A ? ?Psychiatric Specialty Exam: ? ?Presentation  ?General Appearance: Fairly Groomed ? ?Eye Contact:Fleeting ? ?Speech:Blocked ? ?Speech Volume:Normal ? ?Handedness:Right ? ? ?Mood and Affect  ?Mood:Anxious ? ?Affect:Congruent ? ? ?Thought Process  ?Thought Processes:Disorganized ? ?Descriptions of Associations:Intact ? ?Orientation:Full (Time, Place and Person) ? ?Thought Content:Paranoid Ideation ? ?History of Schizophrenia/Schizoaffective disorder:Yes ? ?Duration of Psychotic Symptoms:Greater than six months ? ?Hallucinations:No data recorded ?Ideas of Reference:Paranoia ? ?Suicidal Thoughts:No data recorded ?Homicidal Thoughts:No data recorded ? ?Sensorium  ?Memory:Immediate Fair; Recent Fair; Remote Fair ? ?Judgment:Impaired ? ?Insight:Lacking ? ? ?Executive Functions  ?Concentration:Fair ? ?Attention Span:Fair ? ?Recall:Fair ? ?Fund of Knowledge:Fair ? ?Language:Fair ? ? ?Psychomotor Activity  ?Psychomotor Activity:No data recorded ? ?Assets  ?Assets:Desire for Improvement; Financial Resources/Insurance; Housing; Social Support; Talents/Skills; Vocational/Educational; Transportation; Physical Health ? ? ?Sleep  ?Sleep:No data recorded ? ? ?Physical Exam: ?Physical Exam ?Vitals and nursing note reviewed.  ?Constitutional:   ?   Appearance: Normal appearance.  ?HENT:  ?   Head: Normocephalic and atraumatic.  ?    Mouth/Throat:  ?   Pharynx: Oropharynx is clear.  ?Eyes:  ?   Pupils: Pupils are equal, round, and reactive to light.  ?Cardiovascular:  ?   Rate and Rhythm: Normal rate and regular rhythm.  ?Pulmonary:  ?   Effort: Pulmonary effort is normal.  ?   Breath sounds: Normal breath sounds.  ?Abdominal:  ?   General: Abdomen is flat.  ?   Palpations: Abdomen is soft.  ?Musculoskeletal:     ?   General: Normal range of motion.  ?Skin: ?   General: Skin is warm and dry.  ?Neurological:  ?  General: No focal deficit present.  ?   Mental Status: He is alert. Mental status is at baseline.  ?Psychiatric:     ?   Attention and Perception: Attention normal.     ?   Mood and Affect: Mood normal.     ?   Speech: Speech normal.     ?   Behavior: Behavior normal.     ?   Thought Content: Thought content normal.     ?   Cognition and Memory: Cognition normal.  ? ?Review of Systems  ?Constitutional: Negative.   ?HENT: Negative.    ?Eyes: Negative.   ?Respiratory: Negative.    ?Cardiovascular: Negative.   ?Gastrointestinal: Negative.   ?Musculoskeletal: Negative.   ?Skin: Negative.   ?Neurological: Negative.   ?Psychiatric/Behavioral:  Negative for depression, hallucinations, substance abuse and suicidal ideas. The patient is not nervous/anxious.   ?Blood pressure 98/62, pulse 62, temperature (!) 97.5 ?F (36.4 ?C), temperature source Oral, resp. rate 17, height 5\' 10"  (1.778 m), weight 59 kg, SpO2 100 %. Body mass index is 18.66 kg/m?. ? ? ?Treatment Plan Summary: ?Medication management and Plan continue current medication management.  Supportive counseling and therapy.  [facility] and confirmed that they had received the referral and spoke with his mother today and sent a message to his father as well letting them know about the referral. ? ? , MD ?02/21/2022, 3:09 PM ? ?

## 2022-02-21 NOTE — Group Note (Signed)
[facility] LCSW Group Therapy Note ? ? ?Group Date: 02/21/2022 ?Start Time: 1300 ?End Time: 1400 ? ? ?Type of Therapy/Topic:  Group Therapy:  Emotion Regulation ? ?Participation Level:  Active  ? ?Mood: ? ?Description of Group:   ? The purpose of this group is to assist patients in learning to regulate negative emotions and experience positive emotions. Patients will be guided to discuss ways in which they have been vulnerable to their negative emotions. These vulnerabilities will be juxtaposed with experiences of positive emotions or situations, and patients challenged to use positive emotions to combat negative ones. Special emphasis will be placed on coping with negative emotions in conflict situations, and patients will process healthy conflict resolution skills. ? ?Therapeutic Goals: ?Patient will identify two positive emotions or experiences to reflect on in order to balance out negative emotions:  ?Patient will label two or more emotions that they find the most difficult to experience:  ?Patient will be able to demonstrate positive conflict resolution skills through discussion or role plays:  ? ?Summary of Patient Progress: ?Patient was present for the entirety of the group session. Patient was an active listener and participated in the topic of discussion, provided helpful advice to others, and added nuance to topic of conversation. Patient shared he has difficulty managing his emotions, particularly love/infatuation. Patient shared that he often over extends himself to others and leaves himself vulnerable.  ? ? ? ?Therapeutic Modalities:   ?Cognitive Behavioral Therapy ?Feelings Identification ?Dialectical Behavioral Therapy ? ? ?Corky Crafts, LCSWA ?

## 2022-02-21 NOTE — Progress Notes (Signed)
Recreation Therapy Notes ? ?Date: 02/21/2022 ? ?Time: 11:00am  ? ?Location: Craft room   ? ?Behavioral response: N/A ?  ?Intervention Topic: Coping Skills  ? ?Discussion/Intervention: ?Patient refused to attend group.  ? ?Clinical Observations/Feedback:  ?Patient refused to attend group.  ?  ?Villa Burgin LRT/CTRS ? ? ? ? ? ? ? ?Olin Gurski ?02/21/2022 12:13 PM ?

## 2022-02-21 NOTE — Plan of Care (Signed)
Patient rated his depression and anxiety 0/10. Pleasant and cooperative on approach. Patient states his goal is " get discharge and give a bid hug to his parents and brother." Patient denies SI,HI and AVH. Visible in the milieu. Appropriate with staff & peers. Appetite and energy level good. ADLs maintained. Support and encouragement given. ?

## 2022-02-22 ENCOUNTER — Other Ambulatory Visit: Payer: Self-pay

## 2022-02-22 MED ORDER — QUETIAPINE FUMARATE 400 MG PO TABS
400.0000 mg | ORAL_TABLET | Freq: Every day | ORAL | 2 refills | Status: DC
  Filled 2022-02-22: qty 30, 30d supply, fill #0

## 2022-02-22 MED ORDER — BENZTROPINE MESYLATE 0.5 MG PO TABS
0.5000 mg | ORAL_TABLET | Freq: Two times a day (BID) | ORAL | 2 refills | Status: DC
  Filled 2022-02-22: qty 60, 30d supply, fill #0

## 2022-02-22 MED ORDER — GABAPENTIN 100 MG PO CAPS
100.0000 mg | ORAL_CAPSULE | Freq: Three times a day (TID) | ORAL | 2 refills | Status: DC
  Filled 2022-02-22: qty 90, 30d supply, fill #0
  Filled 2022-03-22: qty 90, 30d supply, fill #1

## 2022-02-22 NOTE — Progress Notes (Signed)
D: Pt alert and oriented. Pt denies experiencing any pain, SI/HI, or AVH at this time. Pt reports he will be able to keep himself safe when he returns home. ? ?A: Pt received discharge and medication education/information. Pt belongings were returned and signed for at this time to include printed prescriptions.  ? ?R: Pt verbalized understanding of discharge and medication education/information. ? ?Pt escorted by staff to the medical mall front lobby where pt's dad picked him up.    ?

## 2022-02-22 NOTE — Progress Notes (Signed)
Recreation Therapy Notes ? ?INPATIENT RECREATION TR PLAN ? ?Patient Details ?Name: Christian Bond ?MRN: [id number] ?DOB: Apr 16, 2001 ?Today's Date: 02/22/2022 ? ?Rec Therapy Plan ?Is patient appropriate for Therapeutic Recreation?: Yes ?Treatment times per week: at least 3 ?Estimated Length of Stay: 5-7 days ?TR Treatment/Interventions: Group participation (Comment) ? ?Discharge Criteria ?Pt will be discharged from therapy if:: Discharged ?Treatment plan/goals/alternatives discussed and agreed upon by:: Patient/family ? ?Discharge Summary ?Short term goals set: Patient will demonstrate improved communication skills by spontaneously contributing to 2 group discussions within 5 recreation therapy group sessions ?Short term goals met: Complete ?Progress toward goals comments: Groups attended ?Which groups?: Wellness, Social skills, Leisure education, Stress management, Other (Comment) (Relaxation, Problem Solving) ?Reason goals not met: N/A ?Therapeutic equipment acquired: N/A ?Reason patient discharged from therapy: Discharge from hospital ?Pt/family agrees with progress & goals achieved: Yes ?Date patient discharged from therapy: 02/22/22 ? ? ?Kieran Nachtigal ?02/22/2022, 12:53 PM ?

## 2022-02-22 NOTE — Progress Notes (Signed)
Patient compliant with medications denies SI/HI/A/VH, Visible in [place] interacting well with Peers and Staff. No adverse drug noted. Patient remains safe. Support and encouragement provided.  ? ?

## 2022-02-22 NOTE — Progress Notes (Signed)
?  [facility] Adult Case Management Discharge Plan : ? ?Will you be returning to the same living situation after discharge:  Yes,  Patient to return to parent's residence.  ? ?At discharge, do you have transportation home?: Yes,  Patient's father to assist with transportation from hospital.  ? ?Do you have the ability to pay for your medications: Yes,  BCBS PPO ? ?Release of information consent forms completed and in the chart;  Patient's signature needed at discharge. ? ?Patient to Follow up at: ? Follow-up Information   ? ? [facility]. Call.   ?Why: A referral has been sent on your behalf, please call to discuss scheduling and onboarding process. ?Contact information: ?[address] ?[address] ?[place], [place] [address] ? ?Phone: [phone] ?Fax: [phone] ? ?  ?  ? ? [facility] Go to.   ?Why: If symptoms worsen please present as walkin [phone], if you feel as though you are in immediate crisis please call 911. ?Contact information: ?[address] ?[place] [address] ?[phone] ? ? ?  ?  ? ?  ?  ? ?  ? ? ?Next level of care provider has access to [facility] Link:no ? ?Safety Planning and Suicide Prevention discussed: Yes,  SPE Completed with patient, declined consent for CSW to reach collateral contact.   ?  ?Has patient been referred to the Quitline?: Patient refused referral ? ?Patient has been referred for addiction treatment: Pt. refused referral Patient endorses active cannabis use though denies need for clinical intervention, UDS positive for cannabinoids, BAC <10.  ? ?Corky Crafts, LCSWA ?02/22/2022, 10:30 AM ?

## 2022-02-22 NOTE — BHH Suicide Risk Assessment (Signed)
[facility] Discharge Suicide Risk Assessment ? ? ?Principal Problem: Paranoid schizophrenia (HCC) ?Discharge Diagnoses: Principal Problem: ?  Paranoid schizophrenia (HCC) ?Active Problems: ?  Psychosis (HCC) ? ? ?Total Time spent with patient: 30 minutes ? ?Musculoskeletal: ?Strength & Muscle Tone: within normal limits ?Gait & Station: normal ?Patient leans: N/A ? ?Psychiatric Specialty Exam ? ?Presentation  ?General Appearance: Fairly Groomed ? ?Eye Contact:Fleeting ? ?Speech:Blocked ? ?Speech Volume:Normal ? ?Handedness:Right ? ? ?Mood and Affect  ?Mood:Anxious ? ?Duration of Depression Symptoms: Greater than two weeks ? ?Affect:Congruent ? ? ?Thought Process  ?Thought Processes:Disorganized ? ?Descriptions of Associations:Intact ? ?Orientation:Full (Time, Place and Person) ? ?Thought Content:Paranoid Ideation ? ?History of Schizophrenia/Schizoaffective disorder:Yes ? ?Duration of Psychotic Symptoms:Greater than six months ? ?Hallucinations:No data recorded ?Ideas of Reference:Paranoia ? ?Suicidal Thoughts:No data recorded ?Homicidal Thoughts:No data recorded ? ?Sensorium  ?Memory:Immediate Fair; Recent Fair; Remote Fair ? ?Judgment:Impaired ? ?Insight:Lacking ? ? ?Executive Functions  ?Concentration:Fair ? ?Attention Span:Fair ? ?Recall:Fair ? ?Fund of Knowledge:Fair ? ?Language:Fair ? ? ?Psychomotor Activity  ?Psychomotor Activity:No data recorded ? ?Assets  ?Assets:Desire for Improvement; Financial Resources/Insurance; Housing; Social Support; Talents/Skills; Vocational/Educational; Transportation; Physical Health ? ? ?Sleep  ?Sleep:No data recorded ? ?Physical Exam: ?Physical Exam ?Vitals and nursing note reviewed.  ?Constitutional:   ?   Appearance: Normal appearance.  ?HENT:  ?   Head: Normocephalic and atraumatic.  ?   Mouth/Throat:  ?   Pharynx: Oropharynx is clear.  ?Eyes:  ?   Pupils: Pupils are equal, round, and reactive to light.  ?Cardiovascular:  ?   Rate and Rhythm: Normal rate and regular rhythm.   ?Pulmonary:  ?   Effort: Pulmonary effort is normal.  ?   Breath sounds: Normal breath sounds.  ?Abdominal:  ?   General: Abdomen is flat.  ?   Palpations: Abdomen is soft.  ?Musculoskeletal:     ?   General: Normal range of motion.  ?Skin: ?   General: Skin is warm and dry.  ?Neurological:  ?   General: No focal deficit present.  ?   Mental Status: He is alert. Mental status is at baseline.  ?Psychiatric:     ?   Attention and Perception: Attention normal.     ?   Mood and Affect: Mood normal.     ?   Speech: Speech normal.     ?   Behavior: Behavior normal.     ?   Thought Content: Thought content normal.     ?   Cognition and Memory: Cognition normal.     ?   Judgment: Judgment normal.  ? ?Review of Systems  ?Constitutional: Negative.   ?HENT: Negative.    ?Eyes: Negative.   ?Respiratory: Negative.    ?Cardiovascular: Negative.   ?Gastrointestinal: Negative.   ?Musculoskeletal: Negative.   ?Skin: Negative.   ?Neurological: Negative.   ?Psychiatric/Behavioral: Negative.    ?Blood pressure 110/70, pulse 72, temperature 97.8 ?F (36.6 ?C), temperature source Oral, resp. rate 17, height 5\' 10"  (1.778 m), weight 59 kg, SpO2 100 %. Body mass index is 18.66 kg/m?. ? ?Mental Status Per Nursing Assessment::   ?On Admission:  NA ? ?Demographic Factors:  ?Male and Adolescent or young adult ? ?Loss Factors: ?NA ? ?Historical Factors: ?NA ? ?Risk Reduction Factors:   ?Sense of responsibility to family, Living with another person, especially a relative, Positive social support, and Positive coping skills or problem solving skills ? ?Continued Clinical Symptoms:  ?Schizophrenia:   Less than 80 years old ? ?Cognitive  Features That Contribute To Risk:  ?None   ? ?Suicide Risk:  ?Minimal: No identifiable suicidal ideation.  Patients presenting with no risk factors but with morbid ruminations; may be classified as minimal risk based on the severity of the depressive symptoms ? ? ? ?Plan Of Care/Follow-up recommendations:  ?Patient  is being discharged today back home with his family.  He is being provided with prescriptions including refills for his medication.  He is calm and with a euthymic affect and completely denies any suicidal ideation and is showing good insight and judgment. ? ?Mordecai Rasmussen, MD ?02/22/2022, 9:55 AM ?

## 2022-02-22 NOTE — Progress Notes (Signed)
Recreation Therapy Notes ? ?Date: 02/22/2022 ? ?Time: 10:50 am  ? ?Location: Craft room  ? ?Behavioral response: Appropriate ? ?Intervention Topic:  Relaxation    ? ?Discussion/Intervention:  ?Group content today was focused on relaxation. The group defined relaxation and identified healthy ways to relax. Individuals expressed how much time they spend relaxing. Patients expressed how much their life would be if they did not make time for themselves to relax. The group stated ways they could improve their relaxation techniques in the future.  Individuals participated in the intervention ?Time to Relax? where they had a chance to experience different relaxation techniques.  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 Individual was social with peers and staff while participating in the intervention.    ?Yaileen Hofferber LRT/CTRS  ? ? ? ? ? ? ? ? ?Diavian Furgason ?02/22/2022 12:49 PM ?

## 2022-02-22 NOTE — Plan of Care (Signed)
D: Pt alert and oriented. Pt rates depression 0/10, hopelessness 0/10, and anxiety 0/10. Pt goal: "Discharge." Pt reports energy level as normal and concentration as being good. Pt reports sleep last night as being good. Pt did receive medications for sleep and did find them helpful. Pt denies experiencing any pain at this time. Pt denies experiencing any SI/HI, or AVH at this time.  ? ?A: Scheduled medications administered to pt, per MD orders. Support and encouragement provided. Frequent verbal contact made. Routine safety checks conducted q15 minutes.  ? ?R: No adverse drug reactions noted. Pt verbally contracts for safety at this time. Pt compliant with medications. Pt interacts well with others on the unit. Pt remains safe at this time. Will continue to monitor.  ? ?Problem: Education: ?Goal: Emotional status will improve ?Outcome: Progressing ?Goal: Mental status will improve ?Outcome: Progressing ?  ?

## 2022-02-22 NOTE — Discharge Summary (Signed)
Physician Discharge Summary Note ? ?Patient:  Christian Bond is an 21 y.o., male ?MRN:  [id number] ?DOB:  December 27, 2000 ?Patient phone:  [phone] (home)  ?Patient address:   ?[address] ?[place] [address],  ?Total Time spent with patient: 30 minutes ? ?Date of Admission:  02/12/2022 ?Date of Discharge: 02/22/2022 ? ?Reason for Admission: Patient was admitted after displaying worsening disorganized bizarre behavior including irresponsible driving and disorganized speech with bizarre thoughts after being off of his medication ? ?Principal Problem: Paranoid schizophrenia ([place]) ?Discharge Diagnoses: Principal Problem: ?  Paranoid schizophrenia ([place]) ?Active Problems: ?  Psychosis ([place]) ? ? ?Past Psychiatric History: Patient has had several hospitalizations in the past 2 years with progressively more clear-cut psychotic symptoms.  Other diagnoses had been considered in the past but at this point it looks very likely that he has schizophrenia or schizoaffective disorder.  No history of violence.  No history of severe substance abuse only mild to moderate cannabis use ? ?Past Medical History:  ?Past Medical History:  ?Diagnosis Date  ? ADHD (attention deficit hyperactivity disorder)   ? Allergy   ?  ?Past Surgical History:  ?Procedure Laterality Date  ? TONSILLECTOMY AND ADENOIDECTOMY    ? ?Family History:  ?Family History  ?Problem Relation Age of Onset  ? Colon polyps Mother   ? Stroke Maternal Uncle   ? Heart disease Maternal Grandfather   ? Hyperlipidemia Maternal Grandfather   ? Stroke Maternal Grandfather   ? ?Family Psychiatric  History: Reportedly there are a couple of relatives 2 or 3 generations away who had psychotic disorders ?Social History:  ?Social History  ? ?Substance and Sexual Activity  ?Alcohol Use No  ?   ?Social History  ? ?Substance and Sexual Activity  ?Drug Use Yes  ? Types: Marijuana  ?  ?Social History  ? ?Socioeconomic History  ? Marital status: Single  ?  Spouse name: Not on file  ? Number of  children: Not on file  ? Years of education: Not on file  ? Highest education level: Not on file  ?Occupational History  ? Not on file  ?Tobacco Use  ? Smoking status: Every Day  ?  Packs/day: 1.00  ?  Types: Cigarettes  ?  Passive exposure: Never  ? Smokeless tobacco: Never  ?Vaping Use  ? Vaping Use: Some days  ?Substance and Sexual Activity  ? Alcohol use: No  ? Drug use: Yes  ?  Types: Marijuana  ? Sexual activity: Not on file  ?Other Topics Concern  ? Not on file  ?Social History Narrative  ? Not on file  ? ?Social Determinants of Health  ? ?Financial Resource Strain: Not on file  ?Food Insecurity: Not on file  ?Transportation Needs: Not on file  ?Physical Activity: Not on file  ?Stress: Not on file  ?Social Connections: Not on file  ? ? ?Hospital Course: Patient admitted to psychiatric unit.  Initially presented with odd disorganized behavior, thought blocking, odd affect, some erratic behavior such as scribbling all over the walls of his bathroom with a crayon and drawing pictures on his face.  The initial plan was to treat him with [facility] with the hopes of transitioning to long-acting injectable.  After several days at 9 mg of oral Invega with which he appeared to be fully compliant he was not showing a significant change in his mental state.  At that point his medicine was switched back to Seroquel which is now at 400 mg at night.  This is a medicine he had responded to well in the past.  Patient has shown a great deal of improvement since being back on quetiapine.  Thoughts at this point are clear without any bizarre thinking or weird behavior.  He has communicated well with his family who also say he seems to be doing much better.  Patient has been educated about the diagnosis and the prognosis and the importance of staying in treatment and expresses understanding of that.  He has also been strongly advised to avoid cannabis or any other kind of intoxicant because of the negative effect it has on his  condition and he agrees to that.  We do not have a definite specific follow-up plan other than a referral to our HA, but I am hopeful that the patient can get into the [facility] program for first episode schizophrenia.  I have put in a referral to that program for him and spoken with his parents about it.  He will be given a total of 3 months worth of medication at discharge to allow for some flexibility in finding outpatient follow-up. ? ?Physical Findings: ?AIMS:  , ,  ,  ,    ?CIWA:    ?COWS:    ? ?Musculoskeletal: ?Strength & Muscle Tone: within normal limits ?Gait & Station: normal ?Patient leans: N/A ? ? ?Psychiatric Specialty Exam: ? ?Presentation  ?General Appearance: Fairly Groomed ? ?Eye Contact:Fleeting ? ?Speech:Blocked ? ?Speech Volume:Normal ? ?Handedness:Right ? ? ?Mood and Affect  ?Mood:Anxious ? ?Affect:Congruent ? ? ?Thought Process  ?Thought Processes:Disorganized ? ?Descriptions of Associations:Intact ? ?Orientation:Full (Time, Place and Person) ? ?Thought Content:Paranoid Ideation ? ?History of Schizophrenia/Schizoaffective disorder:Yes ? ?Duration of Psychotic Symptoms:Greater than six months ? ?Hallucinations:No data recorded ?Ideas of Reference:Paranoia ? ?Suicidal Thoughts:No data recorded ?Homicidal Thoughts:No data recorded ? ?Sensorium  ?Memory:Immediate Fair; Recent Fair; Remote Fair ? ?Judgment:Impaired ? ?Insight:Lacking ? ? ?Executive Functions  ?Concentration:Fair ? ?Attention Span:Fair ? ?Recall:Fair ? ?[place] ? ?Language:Fair ? ? ?Psychomotor Activity  ?Psychomotor Activity:No data recorded ? ?Assets  ?Assets:Desire for Improvement; Financial Resources/Insurance; Housing; Social Support; Talents/Skills; Vocational/Educational; Transportation; Physical Health ? ? ?Sleep  ?Sleep:No data recorded ? ? ?Physical Exam: ?Physical Exam ?Vitals and nursing note reviewed.  ?Constitutional:   ?   Appearance: Normal appearance.  ?HENT:  ?   Head: Normocephalic and atraumatic.   ?   Mouth/Throat:  ?   Pharynx: Oropharynx is clear.  ?Eyes:  ?   Pupils: Pupils are equal, round, and reactive to light.  ?Cardiovascular:  ?   Rate and Rhythm: Normal rate and regular rhythm.  ?Pulmonary:  ?   Effort: Pulmonary effort is normal.  ?   Breath sounds: Normal breath sounds.  ?Abdominal:  ?   General: Abdomen is flat.  ?   Palpations: Abdomen is soft.  ?Musculoskeletal:     ?   General: Normal range of motion.  ?Skin: ?   General: Skin is warm and dry.  ?Neurological:  ?   General: No focal deficit present.  ?   Mental Status: He is alert. Mental status is at baseline.  ?Psychiatric:     ?   Attention and Perception: Attention normal.     ?   Mood and Affect: Mood normal.     ?   Speech: Speech normal.     ?   Behavior: Behavior normal.     ?   Thought Content: Thought content normal.     ?   Cognition  and Memory: Cognition normal.     ?   Judgment: Judgment normal.  ? ?Review of Systems  ?Constitutional: Negative.   ?HENT: Negative.    ?Eyes: Negative.   ?Respiratory: Negative.    ?Cardiovascular: Negative.   ?Gastrointestinal: Negative.   ?Musculoskeletal: Negative.   ?Skin: Negative.   ?Neurological: Negative.   ?Psychiatric/Behavioral: Negative.    ?Blood pressure 110/70, pulse 72, temperature 97.8 ?F (36.6 ?C), temperature source Oral, resp. rate 17, height 5\' 10"  (1.778 m), weight 59 kg, SpO2 100 %. Body mass index is 18.66 kg/m?. ? ? ?Social History  ? ?Tobacco Use  ?Smoking Status Every Day  ? Packs/day: 1.00  ? Types: Cigarettes  ? Passive exposure: Never  ?Smokeless Tobacco Never  ? ?Tobacco Cessation:  N/A, patient does not currently use tobacco products ? ? ?Blood Alcohol level:  ?Lab Results  ?Component Value Date  ? ETH <10 02/11/2022  ? ETH <10 09/29/2021  ? ? ?Metabolic Disorder Labs:  ?Lab Results  ?Component Value Date  ? HGBA1C 5.3 10/04/2021  ? MPG 105.41 10/04/2021  ? MPG 96.8 12/22/2020  ? ?No results found for: PROLACTIN ?Lab Results  ?Component Value Date  ? CHOL 146 10/04/2021   ? TRIG 78 10/04/2021  ? HDL 49 10/04/2021  ? CHOLHDL 3.0 10/04/2021  ? VLDL 16 10/04/2021  ? [place] 81 10/04/2021  ? LDLCALC 160 (H) 12/22/2020  ? ? ?See Psychiatric Specialty Exam and Suicide Risk Assessm

## 2022-02-22 NOTE — Plan of Care (Signed)
?  Problem: Communication Goal: STG - Patient will demonstrate improved communication skills by spontaneously contributing to 2 group discussions within 5 recreation therapy group sessions Description: STG - Patient will demonstrate improved communication skills by spontaneously contributing to 2 group discussions within 5 recreation therapy group sessions Outcome: Completed/Met   

## 2022-03-15 ENCOUNTER — Other Ambulatory Visit: Payer: Self-pay

## 2022-03-15 MED ORDER — OLANZAPINE 5 MG PO TABS
ORAL_TABLET | ORAL | 0 refills | Status: DC
  Filled 2022-03-15: qty 30, 30d supply, fill #0

## 2022-03-15 MED ORDER — QUETIAPINE FUMARATE 300 MG PO TABS
ORAL_TABLET | ORAL | 0 refills | Status: DC
  Filled 2022-03-15: qty 30, 30d supply, fill #0

## 2022-03-15 MED ORDER — OLANZAPINE 10 MG PO TABS
ORAL_TABLET | ORAL | 0 refills | Status: DC
  Filled 2022-03-15: qty 30, 30d supply, fill #0

## 2022-03-22 ENCOUNTER — Other Ambulatory Visit: Payer: Self-pay

## 2022-03-22 MED ORDER — OLANZAPINE 2.5 MG PO TABS
ORAL_TABLET | ORAL | 0 refills | Status: DC
  Filled 2022-03-22 (×2): qty 60, 30d supply, fill #0

## 2022-03-22 MED ORDER — OLANZAPINE 5 MG PO TABS
ORAL_TABLET | ORAL | 0 refills | Status: DC
  Filled 2022-03-22 – 2022-07-02 (×2): qty 30, 30d supply, fill #0

## 2022-03-22 MED ORDER — QUETIAPINE FUMARATE 300 MG PO TABS
ORAL_TABLET | ORAL | 0 refills | Status: DC
  Filled 2022-03-22: qty 30, 60d supply, fill #0

## 2022-03-23 ENCOUNTER — Other Ambulatory Visit: Payer: Self-pay

## 2022-03-28 ENCOUNTER — Other Ambulatory Visit (HOSPITAL_COMMUNITY): Payer: Self-pay | Admitting: Psychiatry

## 2022-03-29 ENCOUNTER — Other Ambulatory Visit: Payer: Self-pay

## 2022-03-29 MED ORDER — OLANZAPINE 20 MG PO TABS
ORAL_TABLET | ORAL | 0 refills | Status: DC
  Filled 2022-03-29: qty 30, 30d supply, fill #0

## 2022-03-30 ENCOUNTER — Other Ambulatory Visit: Payer: Self-pay

## 2022-04-04 ENCOUNTER — Other Ambulatory Visit: Payer: Self-pay

## 2022-04-24 ENCOUNTER — Other Ambulatory Visit: Payer: Self-pay

## 2022-04-24 MED ORDER — OLANZAPINE 20 MG PO TABS
ORAL_TABLET | ORAL | 1 refills | Status: DC
  Filled 2022-04-24: qty 30, 30d supply, fill #0
  Filled 2022-05-28: qty 30, 30d supply, fill #1

## 2022-04-25 ENCOUNTER — Other Ambulatory Visit: Payer: Self-pay

## 2022-05-08 ENCOUNTER — Telehealth: Payer: Self-pay | Admitting: Urology

## 2022-05-08 ENCOUNTER — Encounter: Payer: Self-pay | Admitting: *Deleted

## 2022-05-08 ENCOUNTER — Emergency Department
Admission: EM | Admit: 2022-05-08 | Discharge: 2022-05-08 | Disposition: A | Discharge status: Home / Self Care | Payer: BC Managed Care – PPO | Attending: Emergency Medicine | Admitting: Emergency Medicine

## 2022-05-08 ENCOUNTER — Other Ambulatory Visit: Payer: Self-pay

## 2022-05-08 DIAGNOSIS — W260XXA Contact with knife, initial encounter: Secondary | ICD-10-CM | POA: Diagnosis not present

## 2022-05-08 DIAGNOSIS — S61102A Unspecified open wound of left thumb with damage to nail, initial encounter: Secondary | ICD-10-CM | POA: Diagnosis not present

## 2022-05-08 DIAGNOSIS — S61209A Unspecified open wound of unspecified finger without damage to nail, initial encounter: Secondary | ICD-10-CM

## 2022-05-08 DIAGNOSIS — S61112A Laceration without foreign body of left thumb with damage to nail, initial encounter: Secondary | ICD-10-CM | POA: Diagnosis not present

## 2022-05-08 DIAGNOSIS — S6992XA Unspecified injury of left wrist, hand and finger(s), initial encounter: Secondary | ICD-10-CM | POA: Diagnosis present

## 2022-05-08 DIAGNOSIS — Z23 Encounter for immunization: Secondary | ICD-10-CM | POA: Insufficient documentation

## 2022-05-08 MED ORDER — TETANUS-DIPHTH-ACELL PERTUSSIS 5-2.5-18.5 LF-MCG/0.5 IM SUSY
0.5000 mL | PREFILLED_SYRINGE | Freq: Once | INTRAMUSCULAR | Status: AC
  Administered 2022-05-08: 0.5 mL via INTRAMUSCULAR
  Filled 2022-05-08: qty 0.5

## 2022-05-08 NOTE — Telephone Encounter (Signed)
[name] called stating pt Christian Bond is ready for the penile adhesion removal. Does he need another evaluation or to set up for the procedure. Please advise.

## 2022-05-08 NOTE — ED Provider Notes (Cosign Needed)
[facility] Provider Note    Event Date/Time   First MD Initiated Contact with Patient 05/08/22 1858     (approximate)   History   Laceration   HPI  Christian Bond is a 21 y.o. male with a past medical history of psychosis, paranoid schizophrenia, ADHD, OCD, cannabis abuse who presents today for evaluation of finger laceration.  Patient reports that he was whittling a stick when he accidentally sliced his finger.  He reports that he is unsure of his last tetanus shot.  He denies paresthesias.  He is able to flex and extend his finger normally.  No other injury sustained.  He is quite certain that this was accidental.  Patient Active Problem List   Diagnosis Date Noted   Psychosis (HCC) 02/12/2022   Paranoid schizophrenia (HCC) 02/11/2022   Cannabis abuse 09/30/2021   Attention deficit hyperactivity disorder (ADHD), combined type 08/25/2020   Obsessive compulsive disorder 05/29/2018          Physical Exam   Triage Vital Signs: ED Triage Vitals  Enc Vitals Group     BP 05/08/22 1847 123/77     Pulse Rate 05/08/22 1847 73     Resp 05/08/22 1847 16     Temp 05/08/22 1847 98 F (36.7 C)     Temp Source 05/08/22 1847 Oral     SpO2 05/08/22 1847 98 %     Weight 05/08/22 1848 145 lb (65.8 kg)     Height 05/08/22 1848 5\' 10"  (1.778 m)     Head Circumference --      Peak Flow --      Pain Score 05/08/22 1848 3     Pain Loc --      Pain Edu? --      Excl. in GC? --     Most recent vital signs: Vitals:   05/08/22 1847 05/08/22 2029  BP: 123/77 120/90  Pulse: 73 85  Resp: 16 20  Temp: 98 F (36.7 C)   SpO2: 98% 99%    Physical Exam Vitals and nursing note reviewed.  Constitutional:      General: Awake and alert. No acute distress.    Appearance: Normal appearance. He is well-developed and normal weight.  HENT:     Head: Normocephalic and atraumatic.     Mouth/Throat:     Mouth: Mucous membranes are moist.  Eyes:     General:  PERRL. Normal EOMs        Right eye: No discharge.        Left eye: No discharge.     Conjunctiva/sclera: Conjunctivae normal.  Cardiovascular:     Rate and Rhythm: Normal rate and regular rhythm.     Pulses: Normal pulses.  Pulmonary:     Effort: Pulmonary effort is normal. No respiratory distress.  Abdominal:     Abdomen is soft.  Musculoskeletal:        General: No swelling. Normal range of motion.  Skin:    General: Skin is warm and dry.     Capillary Refill: Capillary refill takes less than 2 seconds.     Findings: No rash.  Left thumb: avulsion off distal thumb, radial aspect involving fingernail. Oozing bright red blood. Full base of wound evaluated, no retained FB, no bone.  Able to flex and extend at isolated PIP and DIP against resistance.  Sensation intact distally Neurological:     Mental Status: He is alert.      ED Results /  Procedures / Treatments   Labs (all labs ordered are listed, but only abnormal results are displayed) Labs Reviewed - No data to display   EKG     RADIOLOGY     PROCEDURES:  Critical Care performed:   Procedures   MEDICATIONS ORDERED IN ED: Medications  Tdap (BOOSTRIX) injection 0.5 mL (0.5 mLs Intramuscular Given 05/08/22 2026)     IMPRESSION / MDM / ASSESSMENT AND PLAN / ED COURSE  I reviewed the triage vital signs and the nursing notes.   Differential diagnosis includes, but is not limited to, avulsion, laceration. Full depth of wound evaluated, not close to bone. Patient is neurovascularly intact.  Sensation intact to light touch throughout finger, do not suspect nerve injury.  Able to flex and extend at isolated DIP and PIP, do not suspect tendon injury.  No retained foreign body.  Wound was anesthetized and irrigated. No skin to repair. Surgicell was placed to fingertip and wound bandaged.   We discussed wound care and bandage care.  Tdap updated. We discussed return precautions and outpatient follow up. Patient and mom  understand and agree.   Patient's presentation is most consistent with acute, uncomplicated illness.     FINAL CLINICAL IMPRESSION(S) / ED DIAGNOSES   Final diagnoses:  Fingertip avulsion, initial encounter     Rx / DC Orders   ED Discharge Orders     None        Note:  This document was prepared using Dragon voice recognition software and may include unintentional dictation errors.   Jackelyn Hoehn, PA-C 05/08/22 2031

## 2022-05-08 NOTE — Discharge Instructions (Signed)
You were evaluated in the emergency department for an avulsion. Keep the area clean and dry.  Wash multiple times per day with soap and water.  Do not go into the ocean or swimming pool.  See your PCP this week for wound recheck. Return to the emergency department for:  -- Fever > 100.39F -- Increase pain in the wound -- Increase redness and swelling -- Pus coming from the wound -- Wound bleeds more than a small amount or it does not stop -- Wound edges come apart -- Severe pain -- Weakness or numbness in the affected area  Or any other new or worsening symptoms. It was a pleasure caring for you.

## 2022-05-08 NOTE — ED Triage Notes (Signed)
Pt has an avulsion to left left.  Pt cut self with a pocket knife while whittling a stick.  Bleeding controlled.  Pt alert.

## 2022-05-08 NOTE — Telephone Encounter (Signed)
Spoke with mom and scheduled appt 05/28/2022

## 2022-05-25 ENCOUNTER — Other Ambulatory Visit: Payer: Self-pay | Admitting: Urology

## 2022-05-25 MED ORDER — DIAZEPAM 5 MG PO TABS: 5.0000 mg | ORAL_TABLET | Freq: Once | ORAL | 0 refills | Status: DC | PRN

## 2022-05-28 ENCOUNTER — Other Ambulatory Visit: Payer: Self-pay

## 2022-05-28 ENCOUNTER — Encounter: Payer: Self-pay | Admitting: Urology

## 2022-05-28 ENCOUNTER — Ambulatory Visit (INDEPENDENT_AMBULATORY_CARE_PROVIDER_SITE_OTHER): Payer: BC Managed Care – PPO | Admitting: Urology

## 2022-05-28 VITALS — BP 109/61 | HR 80 | Ht 70.0 in | Wt 150.8 lb

## 2022-05-28 DIAGNOSIS — L723 Sebaceous cyst: Secondary | ICD-10-CM

## 2022-05-28 DIAGNOSIS — N4889 Other specified disorders of penis: Secondary | ICD-10-CM | POA: Diagnosis not present

## 2022-05-28 HISTORY — PX: PENILE ADHESIONS LYSIS: SHX2198

## 2022-05-28 MED ORDER — OLANZAPINE 20 MG PO TABS
ORAL_TABLET | ORAL | 1 refills | Status: DC
  Filled 2022-05-28 – 2022-07-02 (×2): qty 30, 30d supply, fill #0

## 2022-05-28 MED ORDER — OLANZAPINE 5 MG PO TABS
ORAL_TABLET | ORAL | 0 refills | Status: DC
  Filled 2022-05-28: qty 30, 30d supply, fill #0

## 2022-05-28 NOTE — Patient Instructions (Signed)
No tub baths or swimming for 2 weeks.  You can shower starting tomorrow.  If you notice a small amount of bleeding or oozing from the incision, hold pressure gently for a few minutes with gauze.  The stitch will dissolve on its own.  No sexual activity for 3 to 4 weeks as things heal.  You can take Tylenol or ibuprofen/Aleve if any pain.  If you have high fever over 101 or drainage/redness from the incisions please call the clinic.

## 2022-05-28 NOTE — Progress Notes (Signed)
   05/28/22  Indication: Penile skin bridge, penile sebaceous cyst  Informed consent was obtained and Christian Bond was prepped and draped in standard sterile fashion.  On exam there was a 1 cm penile skin bridge from the glans to the shaft at the 1 o'clock position, as well as a 7 mm sebaceous cyst in the mid dorsal penile shaft.  Local anesthetic was injected into the skin bridge and above the sebaceous cyst.  The skin bridge was clamped with a hemostat for 1 minute, and iris scissors used to divide the bridge.  Spot cautery was used for hemostasis.  The sebaceous cyst was grasped with forceps and a small incision was made above the cyst and the cyst removed entirely.  Spot cautery was used for hemostasis.  A single interrupted 3-0 chromic suture was used for closure.  Legrand Rams, MD 05/28/2022

## 2022-05-30 ENCOUNTER — Other Ambulatory Visit: Payer: Self-pay

## 2022-06-19 ENCOUNTER — Other Ambulatory Visit: Payer: Self-pay

## 2022-06-19 MED ORDER — VITAMIN D-1000 MAX ST 25 MCG (1000 UT) PO TABS
ORAL_TABLET | ORAL | 2 refills | Status: DC
  Filled 2022-08-10: qty 30, fill #0

## 2022-06-27 ENCOUNTER — Other Ambulatory Visit: Payer: Self-pay

## 2022-06-27 MED ORDER — HYDROXYZINE HCL 25 MG PO TABS
ORAL_TABLET | ORAL | 2 refills | Status: DC
  Filled 2022-06-27: qty 60, 30d supply, fill #0
  Filled 2022-08-10: qty 60, 30d supply, fill #1

## 2022-07-02 ENCOUNTER — Other Ambulatory Visit: Payer: Self-pay

## 2022-07-13 ENCOUNTER — Other Ambulatory Visit: Payer: Self-pay

## 2022-07-19 ENCOUNTER — Other Ambulatory Visit: Payer: Self-pay

## 2022-07-19 MED ORDER — OLANZAPINE 20 MG PO TABS
20.0000 mg | ORAL_TABLET | Freq: Every evening | ORAL | 2 refills | Status: DC
  Filled 2022-07-19 – 2022-08-02 (×3): qty 30, 30d supply, fill #0
  Filled 2022-09-02: qty 30, 30d supply, fill #1
  Filled 2022-10-01: qty 30, 30d supply, fill #2

## 2022-08-02 ENCOUNTER — Other Ambulatory Visit: Payer: Self-pay

## 2022-08-03 ENCOUNTER — Other Ambulatory Visit: Payer: Self-pay

## 2022-08-10 ENCOUNTER — Other Ambulatory Visit: Payer: Self-pay

## 2022-08-10 MED ORDER — OLANZAPINE 5 MG PO TABS
ORAL_TABLET | ORAL | 0 refills | Status: DC
  Filled 2022-08-10: qty 30, 30d supply, fill #0

## 2022-09-03 ENCOUNTER — Other Ambulatory Visit: Payer: Self-pay

## 2022-09-11 ENCOUNTER — Other Ambulatory Visit: Payer: Self-pay

## 2022-09-11 MED ORDER — NICOTINE POLACRILEX 4 MG MT GUM
CHEWING_GUM | OROMUCOSAL | 0 refills | Status: DC
  Filled 2022-09-11: qty 110, 10d supply, fill #0

## 2022-09-25 ENCOUNTER — Other Ambulatory Visit: Payer: Self-pay

## 2022-09-25 MED ORDER — FLUOXETINE HCL 10 MG PO TABS
ORAL_TABLET | ORAL | 0 refills | Status: DC
  Filled 2022-09-25: qty 30, 30d supply, fill #0

## 2022-10-01 ENCOUNTER — Other Ambulatory Visit: Payer: Self-pay

## 2022-10-09 ENCOUNTER — Other Ambulatory Visit: Payer: Self-pay

## 2022-10-09 MED ORDER — FLUOXETINE HCL 20 MG PO TABS
20.0000 mg | ORAL_TABLET | Freq: Every day | ORAL | 0 refills | Status: DC
  Filled 2022-10-09: qty 30, 30d supply, fill #0

## 2022-10-09 MED ORDER — OLANZAPINE 20 MG PO TABS
20.0000 mg | ORAL_TABLET | Freq: Every evening | ORAL | 2 refills | Status: DC
  Filled 2022-10-09 – 2022-10-29 (×2): qty 30, 30d supply, fill #0
  Filled 2022-11-30: qty 30, 30d supply, fill #1
  Filled 2022-12-27: qty 30, 30d supply, fill #2

## 2022-10-24 ENCOUNTER — Other Ambulatory Visit: Payer: Self-pay

## 2022-10-24 MED ORDER — FLUOXETINE HCL 20 MG PO TABS
20.0000 mg | ORAL_TABLET | Freq: Every day | ORAL | 1 refills | Status: DC
  Filled 2022-10-24 – 2022-11-13 (×2): qty 30, 30d supply, fill #0

## 2022-10-29 ENCOUNTER — Other Ambulatory Visit: Payer: Self-pay

## 2022-10-30 ENCOUNTER — Ambulatory Visit: Payer: BC Managed Care – PPO | Admitting: Family Medicine

## 2022-10-30 ENCOUNTER — Encounter: Payer: Self-pay | Admitting: Family Medicine

## 2022-10-30 ENCOUNTER — Other Ambulatory Visit: Payer: Self-pay

## 2022-10-30 VITALS — BP 118/68 | HR 92 | Temp 97.9°F | Ht 70.0 in | Wt 151.6 lb

## 2022-10-30 DIAGNOSIS — Z23 Encounter for immunization: Secondary | ICD-10-CM

## 2022-10-30 DIAGNOSIS — Z0000 Encounter for general adult medical examination without abnormal findings: Secondary | ICD-10-CM

## 2022-10-30 NOTE — Assessment & Plan Note (Signed)
Physical exam completed.  Encouraged healthy diet and exercise.  He will be given a flu vaccine today.  I encouraged him to quit using nicotine products and continue to taper down on this.  Patient has had extensive lab work this year.  A1c and lipid panel reviewed.  DMV form filled out.  They will get the psychiatrist to fill out the mental health portion.  His mother will let me know if they would like [place] to send in the form once it is all completed.

## 2022-10-30 NOTE — Progress Notes (Signed)
Marikay Alar, MD Phone: [phone]  Christian Bond is a 21 y.o. male who presents today for CPE.  Patient's mother provides some of the history.  Diet: Wide variety, plenty of fruits and vegetables and meats, they have worked on cutting back on caffeine and reducing sugar intake Exercise: He rides his bike a lot Colonoscopy: Not indicated Prostate cancer screening: Not indicated Family history-  Prostate cancer: no  Colon cancer: Maternal great grandfather and maternal aunt Vaccines-   Flu: due  Tetanus: UTD  COVID19: x2 HIV screening: Reports up-to-date Hep C Screening: Reports up-to-date Tobacco use: Using nicotine vapes and smoking a few cigarettes a week, he is working on cutting down on these Alcohol use: No Illicit Drug use: Has been off of delta for the past 9 weeks which is a liquid form of THC in [place] Dentist: yes Ophthalmology: yes  I did confirm the patient is not drinking any alcohol or using any illicit drugs with his mother out of the room.  Paranoid schizophrenia: Patient continues to see [facility] and [facility] the start treatment teams at [facility].  He is currently on Zyprexa and notes he is doing quite well.  He feels significantly better.  Patient lost his license earlier this year.  His mother notes he was having several episodes of psychosis and he was driving through those episodes.  It was felt at that time that it would be better for him to not drive until he had his psychiatric conditions under better control. He notes these are under good control now.  He has a DMV form that needs to be filled out by me and his psychiatrist.   Active Ambulatory Problems    Diagnosis Date Noted   Obsessive compulsive disorder 05/29/2018   Attention deficit hyperactivity disorder (ADHD), combined type 08/25/2020   Cannabis abuse 09/30/2021   Paranoid schizophrenia (HCC) 02/11/2022   Psychosis (HCC) 02/12/2022   Routine general medical examination at a health care  facility 10/30/2022   Resolved Ambulatory Problems    Diagnosis Date Noted   Allergic rhinitis 01/28/2016   ADD (attention deficit disorder) 03/06/2016   Acne 02/26/2018   Moderate episode of recurrent major depressive disorder (HCC) 08/25/2020   Mixed obsessional thoughts and acts 08/25/2020   Mild episode of recurrent major depressive disorder (HCC) 09/09/2020   Vitamin D deficiency 12/05/2020   Severe episode of recurrent major depressive disorder, without psychotic features (HCC) 12/21/2020   Other specified anxiety disorders 05/17/2021   Penile lesion 08/29/2021   Dysuria 08/29/2021   Rash 08/29/2021   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   Bipolar affective disorder, mixed, severe, with psychotic behavior (HCC) 09/30/2021   Bipolar affective disorder, mixed, severe (HCC) 10/02/2021   Past Medical History:  Diagnosis Date   ADHD (attention deficit hyperactivity disorder)    Allergy     Family History  Problem Relation Age of Onset   Colon polyps Mother    Stroke Maternal Uncle    Heart disease Maternal Grandfather    Hyperlipidemia Maternal Grandfather    Stroke Maternal Grandfather     Social History   Socioeconomic History   Marital status: Single    Spouse name: Not on file   Number of children: Not on file   Years of education: Not on file   Highest education level: Not on file  Occupational History   Not on file  Tobacco Use   Smoking status: Every Day    Packs/day: 1.00    Types: Cigarettes  Passive exposure: Never   Smokeless tobacco: Never  Vaping Use   Vaping Use: Some days  Substance and Sexual Activity   Alcohol use: Yes   Drug use: Yes    Types: Marijuana   Sexual activity: Yes  Other Topics Concern   Not on file  Social History Narrative   Not on file   Social Determinants of Health   Financial Resource Strain: Not on file  Food Insecurity: Not on file  Transportation Needs: Not on file  Physical Activity: Not on  file  Stress: Not on file  Social Connections: Not on file  Intimate Partner Violence: Not on file    ROS  General:  Negative for nexplained weight loss, fever Skin: Negative for new or changing mole, sore that won't heal HEENT: Negative for trouble hearing, trouble seeing, ringing in ears, mouth sores, hoarseness, change in voice, dysphagia. CV:  Negative for chest pain, dyspnea, edema, palpitations Resp: Negative for cough, dyspnea, hemoptysis GI: Negative for nausea, vomiting, diarrhea, constipation, abdominal pain, melena, hematochezia. GU: Negative for dysuria, incontinence, urinary hesitance, hematuria, vaginal or penile discharge, polyuria, sexual difficulty, lumps in testicle or breasts MSK: Negative for muscle cramps or aches, joint pain or swelling Neuro: Negative for headaches, weakness, numbness, dizziness, passing out/fainting Psych: Negative for depression, anxiety, memory problems  Objective  Physical Exam Vitals:   10/30/22 1133  BP: 118/68  Pulse: 92  Temp: 97.9 F (36.6 C)  SpO2: 99%    BP Readings from Last 3 Encounters:  10/30/22 118/68  05/28/22 109/61  05/08/22 120/90   Wt Readings from Last 3 Encounters:  10/30/22 151 lb 9.6 oz (68.8 kg)  05/28/22 150 lb 12.8 oz (68.4 kg)  05/08/22 145 lb (65.8 kg)    Physical Exam Constitutional:      General: He is not in acute distress.    Appearance: He is not diaphoretic.  HENT:     Head: Normocephalic and atraumatic.  Cardiovascular:     Rate and Rhythm: Normal rate and regular rhythm.     Heart sounds: Normal heart sounds.  Pulmonary:     Effort: Pulmonary effort is normal.     Breath sounds: Normal breath sounds.  Abdominal:     General: Bowel sounds are normal. There is no distension.     Palpations: Abdomen is soft.     Tenderness: There is no abdominal tenderness.  Musculoskeletal:     Right lower leg: No edema.     Left lower leg: No edema.  Lymphadenopathy:     Cervical: No cervical  adenopathy.  Skin:    General: Skin is warm and dry.  Neurological:     Mental Status: He is alert.  Psychiatric:        Mood and Affect: Mood normal.      Assessment/Plan:   Problem List Items Addressed This Visit     Routine general medical examination at a health care facility - Primary    Physical exam completed.  Encouraged healthy diet and exercise.  He will be given a flu vaccine today.  I encouraged him to quit using nicotine products and continue to taper down on this.  Patient has had extensive lab work this year.  A1c and lipid panel reviewed.  DMV form filled out.  They will get the psychiatrist to fill out the mental health portion.  His mother will let me know if they would like [place] to send in the form once it is all completed.  Other Visit Diagnoses     Need for immunization against influenza       Relevant Orders   Flu Vaccine QUAD 74mo+IM (Fluarix, Fluzone & Alfiuria Quad PF) (Completed)       Return in about 1 year (around 10/31/2023) for CPE.   Marikay Alar, MD [facility] Primary Care [facility]

## 2022-11-13 ENCOUNTER — Other Ambulatory Visit: Payer: Self-pay

## 2022-11-23 ENCOUNTER — Other Ambulatory Visit: Payer: Self-pay

## 2022-11-27 ENCOUNTER — Other Ambulatory Visit: Payer: Self-pay

## 2022-11-27 MED ORDER — FLUOXETINE HCL 20 MG PO TABS
20.0000 mg | ORAL_TABLET | Freq: Every day | ORAL | 1 refills | Status: DC
  Filled 2022-11-27: qty 30, 30d supply, fill #0

## 2022-11-30 ENCOUNTER — Other Ambulatory Visit: Payer: Self-pay

## 2022-12-21 ENCOUNTER — Other Ambulatory Visit: Payer: Self-pay

## 2022-12-24 ENCOUNTER — Other Ambulatory Visit: Payer: Self-pay

## 2022-12-24 MED ORDER — FLUOXETINE HCL 20 MG PO TABS
30.0000 mg | ORAL_TABLET | Freq: Every day | ORAL | 1 refills | Status: DC
  Filled 2022-12-24: qty 45, 30d supply, fill #0

## 2022-12-27 ENCOUNTER — Other Ambulatory Visit: Payer: Self-pay

## 2023-01-21 ENCOUNTER — Other Ambulatory Visit: Payer: Self-pay

## 2023-01-21 MED ORDER — FLUOXETINE HCL 40 MG PO CAPS
40.0000 mg | ORAL_CAPSULE | Freq: Every day | ORAL | 1 refills | Status: DC
  Filled 2023-01-21: qty 30, 30d supply, fill #0
  Filled 2023-02-22: qty 30, 30d supply, fill #1

## 2023-01-21 MED ORDER — OLANZAPINE 5 MG PO TABS
2.5000 mg | ORAL_TABLET | Freq: Two times a day (BID) | ORAL | 0 refills | Status: DC | PRN
  Filled 2023-01-21: qty 30, 30d supply, fill #0

## 2023-01-21 MED ORDER — OLANZAPINE 20 MG PO TABS
20.0000 mg | ORAL_TABLET | Freq: Every evening | ORAL | 2 refills | Status: DC
  Filled 2023-01-21: qty 30, 30d supply, fill #0
  Filled 2023-02-08 – 2023-02-22 (×2): qty 30, 30d supply, fill #1
  Filled 2023-08-13: qty 30, 30d supply, fill #2

## 2023-02-08 ENCOUNTER — Other Ambulatory Visit: Payer: Self-pay

## 2023-02-22 ENCOUNTER — Other Ambulatory Visit: Payer: Self-pay

## 2023-03-07 ENCOUNTER — Other Ambulatory Visit: Payer: Self-pay

## 2023-03-07 MED ORDER — HYDROXYZINE HCL 25 MG PO TABS
25.0000 mg | ORAL_TABLET | Freq: Two times a day (BID) | ORAL | 2 refills | Status: DC
  Filled 2023-03-07: qty 60, 30d supply, fill #0

## 2023-03-19 ENCOUNTER — Other Ambulatory Visit: Payer: Self-pay

## 2023-03-26 ENCOUNTER — Other Ambulatory Visit: Payer: Self-pay

## 2023-03-26 MED ORDER — OLANZAPINE 20 MG PO TABS
20.0000 mg | ORAL_TABLET | Freq: Every evening | ORAL | 2 refills | Status: DC
  Filled 2023-03-26: qty 30, 30d supply, fill #0
  Filled 2023-04-29: qty 30, 30d supply, fill #1
  Filled 2023-12-30 – 2024-03-08 (×2): qty 30, 30d supply, fill #2

## 2023-04-01 ENCOUNTER — Encounter (HOSPITAL_COMMUNITY): Payer: Self-pay

## 2023-04-08 ENCOUNTER — Other Ambulatory Visit: Payer: Self-pay

## 2023-04-08 MED ORDER — FLUOXETINE HCL 40 MG PO CAPS
40.0000 mg | ORAL_CAPSULE | Freq: Every day | ORAL | 1 refills | Status: DC
  Filled 2023-04-08: qty 30, 30d supply, fill #0
  Filled 2023-08-13: qty 30, 30d supply, fill #1

## 2023-04-29 ENCOUNTER — Other Ambulatory Visit: Payer: Self-pay

## 2023-05-16 ENCOUNTER — Other Ambulatory Visit: Payer: Self-pay

## 2023-05-16 MED ORDER — NICOTINE POLACRILEX 4 MG MT GUM
4.0000 mg | CHEWING_GUM | OROMUCOSAL | 0 refills | Status: DC | PRN
  Filled 2023-05-16: qty 110, 20d supply, fill #0

## 2023-05-16 MED ORDER — OLANZAPINE 20 MG PO TABS
20.0000 mg | ORAL_TABLET | Freq: Every evening | ORAL | 2 refills | Status: DC
  Filled 2023-05-16: qty 30, 30d supply, fill #0
  Filled 2023-07-01: qty 30, 30d supply, fill #1
  Filled 2023-07-24: qty 30, 30d supply, fill #2

## 2023-05-16 MED ORDER — FLUOXETINE HCL 40 MG PO CAPS
40.0000 mg | ORAL_CAPSULE | Freq: Every day | ORAL | 2 refills | Status: DC
  Filled 2023-05-16: qty 30, 30d supply, fill #0
  Filled 2023-07-19: qty 30, 30d supply, fill #1
  Filled 2023-09-13: qty 30, 30d supply, fill #2

## 2023-05-16 MED ORDER — NICOTINE 21 MG/24HR TD PT24
21.0000 mg | MEDICATED_PATCH | Freq: Every day | TRANSDERMAL | 1 refills | Status: DC
  Filled 2023-05-16: qty 28, 28d supply, fill #0
  Filled 2023-09-13: qty 28, 28d supply, fill #1

## 2023-07-01 ENCOUNTER — Other Ambulatory Visit: Payer: Self-pay

## 2023-07-09 ENCOUNTER — Other Ambulatory Visit: Payer: Self-pay

## 2023-07-09 MED ORDER — NICOTINE 21 MG/24HR TD PT24
21.0000 mg | MEDICATED_PATCH | Freq: Every day | TRANSDERMAL | 0 refills | Status: DC
  Filled 2023-07-09: qty 28, 28d supply, fill #0

## 2023-07-09 MED ORDER — NICOTINE POLACRILEX 4 MG MT GUM
4.0000 mg | CHEWING_GUM | OROMUCOSAL | 0 refills | Status: DC | PRN
  Filled 2023-07-09: qty 110, 10d supply, fill #0

## 2023-07-19 ENCOUNTER — Other Ambulatory Visit: Payer: Self-pay

## 2023-07-24 ENCOUNTER — Other Ambulatory Visit: Payer: Self-pay

## 2023-08-14 ENCOUNTER — Other Ambulatory Visit: Payer: Self-pay

## 2023-09-03 ENCOUNTER — Other Ambulatory Visit: Payer: Self-pay

## 2023-09-03 MED ORDER — OLANZAPINE 15 MG PO TABS
15.0000 mg | ORAL_TABLET | Freq: Every evening | ORAL | 0 refills | Status: DC
  Filled 2023-09-03: qty 30, 30d supply, fill #0

## 2023-09-13 ENCOUNTER — Other Ambulatory Visit: Payer: Self-pay

## 2023-09-20 ENCOUNTER — Other Ambulatory Visit: Payer: Self-pay

## 2023-09-24 ENCOUNTER — Other Ambulatory Visit: Payer: Self-pay

## 2023-09-24 MED ORDER — FLUOXETINE HCL 40 MG PO CAPS
40.0000 mg | ORAL_CAPSULE | Freq: Every day | ORAL | 2 refills | Status: DC
  Filled 2023-09-24 – 2023-11-12 (×2): qty 30, 30d supply, fill #0

## 2023-09-24 MED ORDER — OLANZAPINE 15 MG PO TABS
15.0000 mg | ORAL_TABLET | Freq: Every evening | ORAL | 0 refills | Status: DC
  Filled 2023-09-24 – 2023-10-01 (×2): qty 30, 30d supply, fill #0

## 2023-10-01 ENCOUNTER — Other Ambulatory Visit: Payer: Self-pay

## 2023-11-01 ENCOUNTER — Encounter: Payer: Self-pay | Admitting: Family Medicine

## 2023-11-01 ENCOUNTER — Ambulatory Visit (INDEPENDENT_AMBULATORY_CARE_PROVIDER_SITE_OTHER): Payer: BC Managed Care – PPO | Admitting: Family Medicine

## 2023-11-01 ENCOUNTER — Other Ambulatory Visit: Payer: Self-pay

## 2023-11-01 VITALS — BP 116/70 | HR 83 | Temp 98.5°F | Ht 70.0 in | Wt 168.0 lb

## 2023-11-01 DIAGNOSIS — Z5181 Encounter for therapeutic drug level monitoring: Secondary | ICD-10-CM | POA: Diagnosis not present

## 2023-11-01 DIAGNOSIS — Z1159 Encounter for screening for other viral diseases: Secondary | ICD-10-CM | POA: Diagnosis not present

## 2023-11-01 DIAGNOSIS — Z0000 Encounter for general adult medical examination without abnormal findings: Secondary | ICD-10-CM

## 2023-11-01 DIAGNOSIS — Z13220 Encounter for screening for lipoid disorders: Secondary | ICD-10-CM | POA: Diagnosis not present

## 2023-11-01 DIAGNOSIS — Z114 Encounter for screening for human immunodeficiency virus [HIV]: Secondary | ICD-10-CM

## 2023-11-01 NOTE — Progress Notes (Signed)
Marikay Alar, MD Phone: [phone]  Christian Bond is a 22 y.o. male who presents today for CPE.  Diet: generally healthy, plenty of fruits and vegetables, does have one soda and one sweet tea per day Exercise: none Colonoscopy: not indicated Prostate cancer screening: not indicated Family history-  Prostate cancer: no  Colon cancer: no Vaccines-   Flu: UTD  Tetanus: UTD  COVID19: x1 HIV screening: due Hep C Screening: due Tobacco use: 5 cigarettes per day Alcohol use: 6/week Illicit Drug use: no Dentist: yes Ophthalmology: yes Patient also reports some erectile issues.  At times notes difficulty attaining and maintaining erections.  He is able to ejaculate at times.  He notes no nighttime erections.   Active Ambulatory Problems    Diagnosis Date Noted   Obsessive compulsive disorder 05/29/2018   Attention deficit hyperactivity disorder (ADHD), combined type 08/25/2020   Cannabis abuse 09/30/2021   Paranoid schizophrenia (HCC) 02/11/2022   Psychosis (HCC) 02/12/2022   Routine general medical examination at a health care facility 10/30/2022   Resolved Ambulatory Problems    Diagnosis Date Noted   Allergic rhinitis 01/28/2016   ADD (attention deficit disorder) 03/06/2016   Acne 02/26/2018   Moderate episode of recurrent major depressive disorder (HCC) 08/25/2020   Mixed obsessional thoughts and acts 08/25/2020   Mild episode of recurrent major depressive disorder (HCC) 09/09/2020   Vitamin D deficiency 12/05/2020   Severe episode of recurrent major depressive disorder, without psychotic features (HCC) 12/21/2020   Other specified anxiety disorders 05/17/2021   Penile lesion 08/29/2021   Dysuria 08/29/2021   Rash 08/29/2021   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   Bipolar affective disorder, mixed, severe, with psychotic behavior (HCC) 09/30/2021   Bipolar affective disorder, mixed, severe (HCC) 10/02/2021   Past Medical History:  Diagnosis  Date   ADHD (attention deficit hyperactivity disorder)    Allergy     Family History  Problem Relation Age of Onset   Colon polyps Mother    Stroke Maternal Uncle    Heart disease Maternal Grandfather    Hyperlipidemia Maternal Grandfather    Stroke Maternal Grandfather     Social History   Socioeconomic History   Marital status: Single    Spouse name: Not on file   Number of children: Not on file   Years of education: Not on file   Highest education level: Not on file  Occupational History   Not on file  Tobacco Use   Smoking status: Every Day    Current packs/day: 1.00    Types: Cigarettes    Passive exposure: Never   Smokeless tobacco: Never  Vaping Use   Vaping status: Some Days  Substance and Sexual Activity   Alcohol use: Yes   Drug use: Yes    Types: Marijuana   Sexual activity: Yes  Other Topics Concern   Not on file  Social History Narrative   Not on file   Social Determinants of Health   Financial Resource Strain: Not on file  Food Insecurity: Not on file  Transportation Needs: Not on file  Physical Activity: Not on file  Stress: Not on file  Social Connections: Unknown (04/10/2022)   Received from [facility]   Social Network    Social Network: Not on file  Intimate Partner Violence: Unknown (03/02/2022)   Received from [facility], [facility]   HITS    Physically Hurt: Not on file    Insult or Talk Down To: Not on file  Threaten Physical Harm: Not on file    Scream or Curse: Not on file    ROS  General:  Negative for nexplained weight loss, fever Skin: Negative for new or changing mole, sore that won't heal HEENT: Negative for trouble hearing, trouble seeing, ringing in ears, mouth sores, hoarseness, change in voice, dysphagia. CV:  Negative for chest pain, dyspnea, edema, palpitations Resp: Negative for cough, dyspnea, hemoptysis GI: Negative for nausea, vomiting, diarrhea, constipation, abdominal pain, melena,  hematochezia. GU: Negative for dysuria, incontinence, urinary hesitance, hematuria, vaginal or penile discharge, polyuria, sexual difficulty, lumps in testicle or breasts MSK: Negative for muscle cramps or aches, joint pain or swelling Neuro: Negative for headaches, weakness, numbness, dizziness, passing out/fainting Psych: Negative for depression, anxiety, memory problems  Objective  Physical Exam Vitals:   11/01/23 1356  BP: 116/70  Pulse: 83  Temp: 98.5 F (36.9 C)  SpO2: 98%    BP Readings from Last 3 Encounters:  11/01/23 116/70  10/30/22 118/68  05/28/22 109/61   Wt Readings from Last 3 Encounters:  11/01/23 168 lb (76.2 kg)  10/30/22 151 lb 9.6 oz (68.8 kg)  05/28/22 150 lb 12.8 oz (68.4 kg)    Physical Exam Constitutional:      General: He is not in acute distress.    Appearance: He is not diaphoretic.  HENT:     Head: Normocephalic and atraumatic.  Cardiovascular:     Rate and Rhythm: Normal rate and regular rhythm.     Heart sounds: Normal heart sounds.  Pulmonary:     Effort: Pulmonary effort is normal.     Breath sounds: Normal breath sounds.  Abdominal:     General: Bowel sounds are normal. There is no distension.     Palpations: Abdomen is soft.     Tenderness: There is no abdominal tenderness.  Musculoskeletal:     Right lower leg: No edema.     Left lower leg: No edema.  Lymphadenopathy:     Cervical: No cervical adenopathy.  Skin:    General: Skin is warm and dry.  Neurological:     Mental Status: He is alert.  Psychiatric:        Mood and Affect: Mood normal.      Assessment/Plan:   Routine general medical examination at a health care facility Assessment & Plan: Physical exam completed.  Encouraged healthy diet.  Discussed adding in exercise.  Encouraged smoking cessation.  Advised minimizing alcohol intake although certainly not to increase his alcohol intake.  Lab work as outlined.  Discussed his erectile issues may be related to his  psychiatric medications.  I encouraged him to discuss this with his psychiatrist.  Advised to let us know if his psychiatrist did not feel as though the medications were contributing to this.  I discussed at his age it would be less likely to be related to a vasculogenic issue.   Medication monitoring encounter -     Hemoglobin A1c -     CBC with Differential/Platelet  Lipid screening -     Comprehensive metabolic panel -     Lipid panel  Need for hepatitis C screening test -     Hepatitis C antibody  Encounter for screening for HIV -     HIV Antibody (routine testing w rflx)    Return in about 1 year (around 10/31/2024) for physical new provider.   Marikay Alar, MD [facility] Primary Care [facility]

## 2023-11-01 NOTE — Assessment & Plan Note (Signed)
Physical exam completed.  Encouraged healthy diet.  Discussed adding in exercise.  Encouraged smoking cessation.  Advised minimizing alcohol intake although certainly not to increase his alcohol intake.  Lab work as outlined.  Discussed his erectile issues may be related to his psychiatric medications.  I encouraged him to discuss this with his psychiatrist.  Advised to let us know if his psychiatrist did not feel as though the medications were contributing to this.  I discussed at his age it would be less likely to be related to a vasculogenic issue.

## 2023-11-02 LAB — LIPID PANEL
Cholesterol: 203 mg/dL — ABNORMAL HIGH (ref <200)
HDL: 44 mg/dL (ref >=40)
LDL Cholesterol (Calc): 134 mg/dL — ABNORMAL HIGH
Non-HDL Cholesterol (Calc): 159 mg/dL — ABNORMAL HIGH (ref <130)
Total CHOL/HDL Ratio: 4.6 (calc) (ref <5.0)
Triglycerides: 140 mg/dL (ref <150)

## 2023-11-02 LAB — CBC WITH DIFFERENTIAL/PLATELET
Absolute Lymphocytes: 2165 {cells}/uL (ref 850–3900)
Absolute Monocytes: 486 {cells}/uL (ref 200–950)
Basophils Absolute: 11 {cells}/uL (ref 0–200)
Basophils Relative: 0.2 %
Eosinophils Absolute: 49 {cells}/uL (ref 15–500)
Eosinophils Relative: 0.9 %
HCT: 41.6 % (ref 38.5–50.0)
Hemoglobin: 14.2 g/dL (ref 13.2–17.1)
MCH: 32.3 pg (ref 27.0–33.0)
MCHC: 34.1 g/dL (ref 32.0–36.0)
MCV: 94.8 fL (ref 80.0–100.0)
MPV: 9.6 fL (ref 7.5–12.5)
Monocytes Relative: 9 %
Neutro Abs: 2689 {cells}/uL (ref 1500–7800)
Neutrophils Relative %: 49.8 %
Platelets: 339 10*3/uL (ref 140–400)
RBC: 4.39 10*6/uL (ref 4.20–5.80)
RDW: 12.2 % (ref 11.0–15.0)
Total Lymphocyte: 40.1 %
WBC: 5.4 10*3/uL (ref 3.8–10.8)

## 2023-11-02 LAB — COMPREHENSIVE METABOLIC PANEL
AG Ratio: 1.9 (calc) (ref 1.0–2.5)
ALT: 8 U/L — ABNORMAL LOW (ref 9–46)
AST: 11 U/L (ref 10–40)
Albumin: 4.7 g/dL (ref 3.6–5.1)
Alkaline phosphatase (APISO): 68 U/L (ref 36–130)
BUN/Creatinine Ratio: 7 (calc) (ref 6–22)
BUN: 11 mg/dL (ref 7–25)
CO2: 27 mmol/L (ref 20–32)
Calcium: 9.9 mg/dL (ref 8.6–10.3)
Chloride: 102 mmol/L (ref 98–110)
Creat: 1.5 mg/dL — ABNORMAL HIGH (ref 0.60–1.24)
Globulin: 2.5 g/dL (ref 1.9–3.7)
Glucose, Bld: 65 mg/dL (ref 65–99)
Potassium: 4.2 mmol/L (ref 3.5–5.3)
Sodium: 140 mmol/L (ref 135–146)
Total Bilirubin: 0.5 mg/dL (ref 0.2–1.2)
Total Protein: 7.2 g/dL (ref 6.1–8.1)

## 2023-11-02 LAB — HIV ANTIBODY (ROUTINE TESTING W REFLEX): HIV 1&2 Ab, 4th Generation: NONREACTIVE

## 2023-11-02 LAB — HEMOGLOBIN A1C
Hgb A1c MFr Bld: 5.4 %{Hb} (ref <5.7)
Mean Plasma Glucose: 108 mg/dL
eAG (mmol/L): 6 mmol/L

## 2023-11-02 LAB — HEPATITIS C ANTIBODY: Hepatitis C Ab: NONREACTIVE

## 2023-11-04 ENCOUNTER — Other Ambulatory Visit: Payer: Self-pay

## 2023-11-04 ENCOUNTER — Telehealth: Payer: Self-pay | Admitting: *Deleted

## 2023-11-04 ENCOUNTER — Encounter: Payer: Self-pay | Admitting: *Deleted

## 2023-11-04 MED ORDER — OLANZAPINE 15 MG PO TABS
15.0000 mg | ORAL_TABLET | Freq: Every day | ORAL | 0 refills | Status: DC
  Filled 2023-11-04: qty 30, 30d supply, fill #0

## 2023-11-04 NOTE — Telephone Encounter (Signed)
-----   Message from Marikay Alar sent at 11/04/2023  2:45 PM EST ----- Please let the patient know his kidney function is worse than typical.  Has he been taking any ibuprofen or Aleve?  Has he started any new supplements?  How much water has he been drinking?  His cholesterol is worse than a year ago though is not at a level where he needs medication for this.  This needs to be monitored on a yearly basis moving forward.  His other lab work is acceptable.

## 2023-11-04 NOTE — Telephone Encounter (Signed)
Spoke with mom (not on DPR), could not give results.  She will notify pt to call back or check mychart.

## 2023-11-08 ENCOUNTER — Other Ambulatory Visit: Payer: Self-pay | Admitting: Family Medicine

## 2023-11-08 DIAGNOSIS — N179 Acute kidney failure, unspecified: Secondary | ICD-10-CM

## 2023-11-13 ENCOUNTER — Other Ambulatory Visit: Payer: Self-pay

## 2023-11-14 NOTE — Telephone Encounter (Signed)
See result note from 12/6 labs.

## 2023-11-21 ENCOUNTER — Other Ambulatory Visit: Payer: Self-pay

## 2023-11-21 MED ORDER — FLUOXETINE HCL 10 MG PO TABS
10.0000 mg | ORAL_TABLET | Freq: Every day | ORAL | 0 refills | Status: DC
  Filled 2023-11-21: qty 30, 30d supply, fill #0

## 2023-11-21 MED ORDER — FLUOXETINE HCL 40 MG PO CAPS
ORAL_CAPSULE | ORAL | 2 refills | Status: DC
  Filled 2023-11-21: qty 30, 30d supply, fill #0

## 2023-11-25 ENCOUNTER — Other Ambulatory Visit: Payer: Self-pay

## 2023-11-26 ENCOUNTER — Other Ambulatory Visit: Payer: Self-pay

## 2023-11-26 MED ORDER — OLANZAPINE 15 MG PO TABS
15.0000 mg | ORAL_TABLET | Freq: Every day | ORAL | 0 refills | Status: DC
  Filled 2023-11-26: qty 30, 30d supply, fill #0

## 2023-11-28 ENCOUNTER — Other Ambulatory Visit: Payer: Self-pay

## 2023-12-05 ENCOUNTER — Other Ambulatory Visit: Payer: Self-pay

## 2023-12-05 MED ORDER — OLANZAPINE 20 MG PO TABS
20.0000 mg | ORAL_TABLET | Freq: Every evening | ORAL | 0 refills | Status: DC
  Filled 2023-12-05: qty 30, 30d supply, fill #0

## 2023-12-06 ENCOUNTER — Other Ambulatory Visit: Payer: Self-pay

## 2023-12-10 ENCOUNTER — Other Ambulatory Visit: Payer: Self-pay

## 2023-12-10 MED ORDER — OLANZAPINE 5 MG PO TABS
5.0000 mg | ORAL_TABLET | Freq: Two times a day (BID) | ORAL | 0 refills | Status: DC | PRN
  Filled 2023-12-10: qty 60, 30d supply, fill #0

## 2023-12-20 ENCOUNTER — Other Ambulatory Visit (INDEPENDENT_AMBULATORY_CARE_PROVIDER_SITE_OTHER): Payer: 59

## 2023-12-20 DIAGNOSIS — N179 Acute kidney failure, unspecified: Secondary | ICD-10-CM

## 2023-12-20 NOTE — Addendum Note (Signed)
Addended by: Jarvis Morgan D on: 12/20/2023 02:35 PM   Modules accepted: Orders

## 2023-12-21 LAB — BASIC METABOLIC PANEL
BUN/Creatinine Ratio: 9 (ref 9–20)
BUN: 9 mg/dL (ref 6–20)
CO2: 19 mmol/L — ABNORMAL LOW (ref 20–29)
Calcium: 9.6 mg/dL (ref 8.7–10.2)
Chloride: 104 mmol/L (ref 96–106)
Creatinine, Ser: 0.96 mg/dL (ref 0.76–1.27)
Glucose: 109 mg/dL — ABNORMAL HIGH (ref 70–99)
Potassium: 4.3 mmol/L (ref 3.5–5.2)
Sodium: 142 mmol/L (ref 134–144)
eGFR: 115 mL/min/{1.73_m2} (ref >59)

## 2023-12-23 ENCOUNTER — Encounter: Payer: Self-pay | Admitting: Family Medicine

## 2023-12-26 ENCOUNTER — Encounter: Payer: BC Managed Care – PPO | Admitting: Family Medicine

## 2023-12-31 ENCOUNTER — Encounter: Payer: Self-pay | Admitting: Pharmacist

## 2023-12-31 ENCOUNTER — Other Ambulatory Visit: Payer: Self-pay

## 2024-01-02 ENCOUNTER — Other Ambulatory Visit: Payer: Self-pay

## 2024-01-02 ENCOUNTER — Other Ambulatory Visit (HOSPITAL_COMMUNITY): Payer: Self-pay

## 2024-01-02 MED ORDER — OLANZAPINE 20 MG PO TABS
20.0000 mg | ORAL_TABLET | Freq: Every day | ORAL | 0 refills | Status: DC
  Filled 2024-01-02 – 2024-01-15 (×4): qty 30, 30d supply, fill #0

## 2024-01-03 ENCOUNTER — Other Ambulatory Visit: Payer: Self-pay

## 2024-01-14 ENCOUNTER — Other Ambulatory Visit: Payer: Self-pay

## 2024-01-15 ENCOUNTER — Other Ambulatory Visit: Payer: Self-pay

## 2024-02-07 ENCOUNTER — Other Ambulatory Visit: Payer: Self-pay

## 2024-02-07 MED ORDER — OLANZAPINE 20 MG PO TABS
20.0000 mg | ORAL_TABLET | Freq: Every evening | ORAL | 0 refills | Status: DC
  Filled 2024-02-07: qty 30, 30d supply, fill #0

## 2024-03-09 ENCOUNTER — Encounter: Payer: Self-pay | Admitting: Pharmacist

## 2024-03-09 ENCOUNTER — Other Ambulatory Visit: Payer: Self-pay

## 2024-03-10 ENCOUNTER — Other Ambulatory Visit: Payer: Self-pay

## 2024-04-01 ENCOUNTER — Encounter: Payer: BC Managed Care – PPO | Admitting: Family Medicine

## 2024-04-10 ENCOUNTER — Other Ambulatory Visit: Payer: Self-pay

## 2024-04-10 MED ORDER — OLANZAPINE 20 MG PO TABS
20.0000 mg | ORAL_TABLET | Freq: Every evening | ORAL | 0 refills | Status: DC
  Filled 2024-04-10: qty 30, 30d supply, fill #0

## 2024-05-05 ENCOUNTER — Telehealth: Payer: Self-pay

## 2024-05-05 NOTE — Telephone Encounter (Signed)
 Copied from CRM [id number]. Topic: Appointments - Scheduling Inquiry for Clinic >> May 04, 2024  4:13 PM [place] E wrote: Reason for CRM: Patient has [facility] appointment with Dr. Casimir Cleaver on August 7th, and mother called in asking if patient could potentially get seen sooner. Agent was not able to populate sooner dates, but mother stated how patient has been having a loss of appetite and some weight loss. Callback number for mom, Melba Spittle, is [phone].  I spoke with patient and scheduled an appointment for him to see Bascom Bossier, FNP, tomorrow.  Patient does not have DPR on file, so I let him know that we have the forms in our office and he can fill one out while he is here if he would like for us  to be able to speak with his mom or anyone else regarding his health information.

## 2024-05-06 ENCOUNTER — Encounter: Payer: Self-pay | Admitting: Family

## 2024-05-06 ENCOUNTER — Ambulatory Visit: Admitting: Family

## 2024-05-06 ENCOUNTER — Ambulatory Visit: Admitting: Internal Medicine

## 2024-05-06 VITALS — BP 120/72 | HR 93 | Temp 97.6°F | Ht 70.0 in | Wt 143.8 lb

## 2024-05-06 DIAGNOSIS — R634 Abnormal weight loss: Secondary | ICD-10-CM | POA: Insufficient documentation

## 2024-05-06 LAB — URINALYSIS, ROUTINE W REFLEX MICROSCOPIC
Bilirubin Urine: NEGATIVE
Leukocytes,Ua: NEGATIVE
Nitrite: NEGATIVE
Specific Gravity, Urine: 1.02 (ref 1.000–1.030)
Total Protein, Urine: NEGATIVE
Urine Glucose: NEGATIVE
Urobilinogen, UA: 0.2 (ref 0.0–1.0)
pH: 6 (ref 5.0–8.0)

## 2024-05-06 LAB — CBC WITH DIFFERENTIAL/PLATELET
Basophils Absolute: 0 10*3/uL (ref 0.0–0.1)
Basophils Relative: 0.3 % (ref 0.0–3.0)
Eosinophils Absolute: 0.1 10*3/uL (ref 0.0–0.7)
Eosinophils Relative: 0.8 % (ref 0.0–5.0)
HCT: 40.4 % (ref 39.0–52.0)
Hemoglobin: 14 g/dL (ref 13.0–17.0)
Lymphocytes Relative: 25 % (ref 12.0–46.0)
Lymphs Abs: 2.2 10*3/uL (ref 0.7–4.0)
MCHC: 34.6 g/dL (ref 30.0–36.0)
MCV: 90.1 fl (ref 78.0–100.0)
Monocytes Absolute: 0.4 10*3/uL (ref 0.1–1.0)
Monocytes Relative: 4.3 % (ref 3.0–12.0)
Neutro Abs: 6 10*3/uL (ref 1.4–7.7)
Neutrophils Relative %: 69.6 % (ref 43.0–77.0)
Platelets: 395 10*3/uL (ref 150.0–400.0)
RBC: 4.49 Mil/uL (ref 4.22–5.81)
RDW: 13.3 % (ref 11.5–15.5)
WBC: 8.7 10*3/uL (ref 4.0–10.5)

## 2024-05-06 NOTE — Progress Notes (Signed)
 Assessment & Plan:  Weight loss, unintentional Assessment & Plan: Unintentional weight loss. Reassuring exam. Recorded food intake yesterday and we discussed in detail not eating enough calories. Discussed vaping and nicotine  as appetite suppressant. He is following closely with psychiatry and counseling.  NO B symptoms. Due to progressive nature of weight loss, pending labs and ct chest and abdomen to evaluate for underlying malignancy. Consider nutrition referral to discuss caloric dense foods.  Orders: -     CBC with Differential/Platelet -     CT CHEST ABDOMEN PELVIS W CONTRAST; Future -     Urinalysis, Routine w reflex microscopic -     Comprehensive metabolic panel with GFR -     TSH     Return precautions given.   Risks, benefits, and alternatives of the medications and treatment plan prescribed today were discussed, and patient expressed understanding.   Education regarding symptom management and diagnosis given to patient on AVS either electronically or printed.  No follow-ups on file.  Bascom Bossier, FNP  Subjective:    Patient ID: Christian Bond, male    DOB: 30-Aug-2001, 23 y.o.   MRN: [id number]  CC: Christian Bond is a 23 y.o. male who presents today for an acute visit.    HPI: Here today to discuss weight loss.  He endorses reducing caloric intake. Weight loss was not intentional  He may have a BM every 2-3 days. Then will fluctuate to daily, Occassional loose brown stool.   No fever, chills, abdominal pain, rectal bleeding, pencil thin stool, vomiting, cp, sob   He hasn't eaten since yesterday  Food diary yesterday:  One small bowl cereal with milk for breakfast , no lunch. He had a BBQ sandwich for dinner.  Drinks water and two cans of soda per day.   He is back in school [employer] transfer pathway. School is stressor for him.  Lives with parents.    Denies alcohol use. Using THC 2-3 times per week.   He is no longer on  zypreza 2.5 or 5mg   He is not taking  prozac , atarax   Video counseling yesterday ; anxiety 4/10  Moderating THC use No alcohol use this past month  Follows with [facility] psychiatry, last seen 04/09/24 Dr Roderic City Discussed weight loss ( 15 lbs in 3 mos). Discussed vaping instead of eating.  Continued on olanzaprine 20mg  at bedtime.  H/o substance abuse  TOC 07/02/24 Dr Casimir Cleaver  Allergies: Amoxicillin and Penicillins Current Outpatient Medications on File Prior to Visit  Medication Sig Dispense Refill   nicotine  polacrilex (NICORETTE ) 4 MG gum Apply 1 each (4 mg total) to cheek every two (2) hours as needed for smoking cessation. 110 each 0   nicotine  polacrilex (NICORETTE ) 4 MG gum Take 1 each (4 mg total) by mouth/to cheek every 2 (two) hours as needed for smoking cessation. 110 each 0   nicotine  polacrilex (NICORETTE ) 4 MG gum Take 1 each (4 mg total) by mouth every 2 (two) hours as needed for smoking cessation. 110 each 0   OLANZapine  (ZYPREXA ) 20 MG tablet Take 1 tablet (20 mg total) by mouth every evening. 30 tablet 2   OLANZapine  (ZYPREXA ) 20 MG tablet Take 1 tablet (20 mg total) by mouth Nightly. 30 tablet 0   OLANZapine  (ZYPREXA ) 20 MG tablet Take 1 tablet (20 mg total) by mouth at bedtime. 30 tablet 0   OLANZapine  (ZYPREXA ) 20 MG tablet Take 1 tablet (20 mg total) by mouth at bedtime. 30  tablet 0   OLANZapine  (ZYPREXA ) 20 MG tablet Take 1 tablet (20 mg total) by mouth Nightly. 30 tablet 0   nicotine  (NICODERM CQ  - DOSED IN MG/24 HOURS) 21 mg/24hr patch Place 1 patch (21 mg total) onto the skin daily. (Patient not taking: Reported on 05/06/2024) 28 patch 1   nicotine  (NICODERM CQ  - DOSED IN MG/24 HOURS) 21 mg/24hr patch Place 1 patch (21 mg total) onto the skin daily. (Patient not taking: Reported on 05/06/2024) 28 patch 0   OLANZapine  (ZYPREXA ) 5 MG tablet Take 5mg  tablet at bedtime with 10mg  tablet for total dose of 15mg  (Patient not taking: Reported on 05/06/2024) 30 tablet 0   OLANZapine   (ZYPREXA ) 5 MG tablet Take 0.5 tablets (2.5 mg total) by mouth 2 (two) times daily as needed (anxiety, psychosis). (Patient not taking: Reported on 05/06/2024) 30 tablet 0   OLANZapine  (ZYPREXA ) 5 MG tablet Take 1 tablet (5 mg total) by mouth 2 (two) times daily as needed for anxiety/psychosis (Patient not taking: Reported on 05/06/2024) 60 tablet 0   [DISCONTINUED] ARIPiprazole  (ABILIFY ) 15 MG tablet Take 1 tablet (15 mg total) by mouth daily. 30 tablet 1   [DISCONTINUED] buPROPion  (WELLBUTRIN  XL) 150 MG 24 hr tablet Take 1 tablet (150 mg total) by mouth daily. 30 tablet 0   [DISCONTINUED] sertraline  (ZOLOFT ) 50 MG tablet Take 1 tablet (50 mg total) by mouth daily. 30 tablet 0   No current facility-administered medications on file prior to visit.    Review of Systems  Constitutional:  Positive for unexpected weight change. Negative for chills, fatigue and fever.  Respiratory:  Negative for cough and shortness of breath.   Cardiovascular:  Negative for chest pain and palpitations.  Gastrointestinal:  Negative for nausea and vomiting.      Objective:    BP 120/72   Pulse 93   Temp 97.6 F (36.4 C) (Oral)   Ht 5' 10 (1.778 m)   Wt 143 lb 12.8 oz (65.2 kg)   SpO2 96%   BMI 20.63 kg/m   BP Readings from Last 3 Encounters:  05/06/24 120/72  11/01/23 116/70  10/30/22 118/68   Wt Readings from Last 3 Encounters:  05/06/24 143 lb 12.8 oz (65.2 kg)  11/01/23 168 lb (76.2 kg)  10/30/22 151 lb 9.6 oz (68.8 kg)  04/09/2024 150lbs  Physical Exam Vitals reviewed.  Constitutional:      Appearance: Normal appearance. He is well-developed.  Neck:     Thyroid : No thyroid  mass or thyromegaly.  Cardiovascular:     Rate and Rhythm: Regular rhythm.     Heart sounds: Normal heart sounds.  Pulmonary:     Effort: Pulmonary effort is normal. No respiratory distress.     Breath sounds: Normal breath sounds. No wheezing, rhonchi or rales.  Abdominal:     General: Bowel sounds are normal. There  is no distension.     Palpations: Abdomen is soft. Abdomen is not rigid. There is no fluid wave or mass.     Tenderness: There is no abdominal tenderness. There is no guarding or rebound. Negative signs include Murphy's sign and McBurney's sign.  Lymphadenopathy:     Head:     Right side of head: No submental, submandibular, tonsillar, preauricular, posterior auricular or occipital adenopathy.     Left side of head: No submental, submandibular, tonsillar, preauricular, posterior auricular or occipital adenopathy.     Cervical: No cervical adenopathy.     Upper Body:     Right upper body:  No supraclavicular or axillary adenopathy.     Left upper body: No supraclavicular or axillary adenopathy.  Skin:    General: Skin is warm and dry.  Neurological:     Mental Status: He is alert.  Psychiatric:        Speech: Speech normal.        Behavior: Behavior normal.

## 2024-05-06 NOTE — Patient Instructions (Addendum)
 I have ordered CT chest  Please let me know how you are doing

## 2024-05-06 NOTE — Assessment & Plan Note (Addendum)
 Unintentional weight loss. Reassuring exam. Recorded food intake yesterday and we discussed in detail not eating enough calories. Discussed vaping and nicotine  as appetite suppressant. He is following closely with psychiatry and counseling.  NO B symptoms. Due to progressive nature of weight loss, pending labs and ct chest and abdomen to evaluate for underlying malignancy. Consider nutrition referral to discuss caloric dense foods.

## 2024-05-07 LAB — COMPREHENSIVE METABOLIC PANEL WITH GFR
AG Ratio: 2.6 (calc) — ABNORMAL HIGH (ref 1.0–2.5)
ALT: 8 U/L — ABNORMAL LOW (ref 9–46)
AST: 11 U/L (ref 10–40)
Albumin: 5.4 g/dL — ABNORMAL HIGH (ref 3.6–5.1)
Alkaline phosphatase (APISO): 53 U/L (ref 36–130)
BUN: 13 mg/dL (ref 7–25)
CO2: 27 mmol/L (ref 20–32)
Calcium: 10.1 mg/dL (ref 8.6–10.3)
Chloride: 102 mmol/L (ref 98–110)
Creat: 1.01 mg/dL (ref 0.60–1.24)
Globulin: 2.1 g/dL (ref 1.9–3.7)
Glucose, Bld: 98 mg/dL (ref 65–99)
Potassium: 4 mmol/L (ref 3.5–5.3)
Sodium: 138 mmol/L (ref 135–146)
Total Bilirubin: 0.9 mg/dL (ref 0.2–1.2)
Total Protein: 7.5 g/dL (ref 6.1–8.1)
eGFR: 107 mL/min/{1.73_m2} (ref >=60)

## 2024-05-07 LAB — TSH: TSH: 1.58 m[IU]/L (ref 0.40–4.50)

## 2024-05-11 ENCOUNTER — Other Ambulatory Visit: Payer: Self-pay

## 2024-05-11 ENCOUNTER — Ambulatory Visit: Payer: Self-pay | Admitting: Family

## 2024-05-11 DIAGNOSIS — R935 Abnormal findings on diagnostic imaging of other abdominal regions, including retroperitoneum: Secondary | ICD-10-CM

## 2024-05-11 MED ORDER — OLANZAPINE 2.5 MG PO TABS
ORAL_TABLET | ORAL | 0 refills | Status: DC
  Filled 2024-05-11: qty 30, 30d supply, fill #0

## 2024-05-11 MED ORDER — OLANZAPINE 20 MG PO TABS
20.0000 mg | ORAL_TABLET | Freq: Every evening | ORAL | 0 refills | Status: DC
  Filled 2024-05-11: qty 30, 30d supply, fill #0

## 2024-05-15 ENCOUNTER — Ambulatory Visit
Admission: RE | Admit: 2024-05-15 | Discharge: 2024-05-15 | Disposition: A | Discharge status: Home / Self Care | Source: Ambulatory Visit | Attending: Family | Admitting: Family

## 2024-05-15 DIAGNOSIS — R634 Abnormal weight loss: Secondary | ICD-10-CM | POA: Diagnosis present

## 2024-05-15 MED ORDER — IOHEXOL 300 MG/ML  SOLN
85.0000 mL | Freq: Once | INTRAMUSCULAR | Status: AC | PRN
  Administered 2024-05-15: 85 mL via INTRAVENOUS

## 2024-05-18 ENCOUNTER — Other Ambulatory Visit: Payer: Self-pay

## 2024-05-18 MED ORDER — OLANZAPINE 2.5 MG PO TABS
2.5000 mg | ORAL_TABLET | Freq: Three times a day (TID) | ORAL | 0 refills | Status: DC | PRN
  Filled 2024-05-18: qty 90, 30d supply, fill #0

## 2024-06-05 ENCOUNTER — Other Ambulatory Visit: Payer: Self-pay

## 2024-06-05 MED ORDER — OLANZAPINE 20 MG PO TABS
20.0000 mg | ORAL_TABLET | Freq: Every day | ORAL | 2 refills | Status: DC
  Filled 2024-06-05: qty 30, 30d supply, fill #0

## 2024-06-17 ENCOUNTER — Ambulatory Visit: Admitting: Family

## 2024-06-19 ENCOUNTER — Ambulatory Visit: Admitting: Family

## 2024-06-23 ENCOUNTER — Other Ambulatory Visit: Payer: Self-pay

## 2024-06-23 MED ORDER — FLUOXETINE HCL 10 MG PO CAPS
ORAL_CAPSULE | ORAL | 0 refills | Status: AC
  Filled 2024-06-23: qty 60, 33d supply, fill #0

## 2024-07-02 ENCOUNTER — Encounter

## 2024-07-07 ENCOUNTER — Encounter (INDEPENDENT_AMBULATORY_CARE_PROVIDER_SITE_OTHER): Payer: Self-pay | Admitting: Vascular Surgery

## 2024-07-08 ENCOUNTER — Encounter: Payer: Self-pay | Admitting: Family

## 2024-07-08 ENCOUNTER — Ambulatory Visit: Admitting: Family

## 2024-07-08 VITALS — BP 124/74 | HR 62 | Temp 97.6°F | Ht 70.0 in | Wt 146.4 lb

## 2024-07-08 DIAGNOSIS — R634 Abnormal weight loss: Secondary | ICD-10-CM

## 2024-07-08 DIAGNOSIS — F1090 Alcohol use, unspecified, uncomplicated: Secondary | ICD-10-CM | POA: Insufficient documentation

## 2024-07-08 NOTE — Assessment & Plan Note (Signed)
 Chronic, uncontrolled. Discussed importance of therapy in group setting and how therapeutic this can be.  Printed address/ time for [employer] tomorrow and strongly encouraged patient to attend.  Continue following with [facility]. Will follow.

## 2024-07-08 NOTE — Progress Notes (Signed)
 Assessment & Plan:  Weight loss, unintentional Assessment & Plan: He has gained weight. Pending vascular consult this month for incidental short aortomesenteric distance seen on CT chest a/p .    Alcohol use Assessment & Plan: Chronic, uncontrolled. Discussed importance of therapy in group setting and how therapeutic this can be.  Printed address/ time for [employer] tomorrow and strongly encouraged patient to attend.  Continue following with [facility]. Will follow.       Return precautions given.   Risks, benefits, and alternatives of the medications and treatment plan prescribed today were discussed, and patient expressed understanding.   Education regarding symptom management and diagnosis given to patient on AVS either electronically or printed.  Return in about 3 months (around 10/08/2024).  Rollene Northern, FNP  Subjective:    Patient ID: Christian Bond, male    DOB: Apr 24, 2001, 23 y.o.   MRN: [id number]  CC: Christian Bond is a 23 y.o. male who presents today for follow up.   HPI: Feels well today He has gained weight. No F, abdominal pain.  He had cereal and eggs for breakfast, cranberry juice. He didn't eat lunch. Beef and baked for dinner. Drinking regular sodas.   He is drinking alcohol. Denies cannabis use. He is drinking beer most days, approx  cans per day. He doesn't normally drink alcohol in the morning. No alcohol use today. He was planning to attend SMART recovery meeting this week in person.   He denies visual and auditory hallucinations.   Denies SI/HI Follow up [name] yesterday for cannabis and alcohol abuse. Attending [facility] teen group meeting. Discussed SMART recovery meeting.  Allergies: Amoxicillin and Penicillins Current Outpatient Medications on File Prior to Visit  Medication Sig Dispense Refill   FLUoxetine  (PROZAC ) 10 MG capsule Take 1 capsule (10mg ) daily for 7 days then increase to 2 capsules  (20mg ) daily 60 capsule 0   nicotine  polacrilex (NICORETTE ) 4 MG gum Apply 1 each (4 mg total) to cheek every two (2) hours as needed for smoking cessation. 110 each 0   nicotine  polacrilex (NICORETTE ) 4 MG gum Take 1 each (4 mg total) by mouth/to cheek every 2 (two) hours as needed for smoking cessation. 110 each 0   nicotine  polacrilex (NICORETTE ) 4 MG gum Take 1 each (4 mg total) by mouth every 2 (two) hours as needed for smoking cessation. 110 each 0   OLANZapine  (ZYPREXA ) 2.5 MG tablet Take 1 tablet (2.5 mg total) by mouth daily as needed (anxiety, psychosis). 30 tablet 0   OLANZapine  (ZYPREXA ) 2.5 MG tablet Take 1 tablet (2.5 mg total) by mouth Three (3) times a day as needed (anxiety, psychosis). 90 tablet 0   OLANZapine  (ZYPREXA ) 20 MG tablet Take 1 tablet (20 mg total) by mouth every evening. 30 tablet 2   OLANZapine  (ZYPREXA ) 20 MG tablet Take 1 tablet (20 mg total) by mouth Nightly. 30 tablet 0   OLANZapine  (ZYPREXA ) 20 MG tablet Take 1 tablet (20 mg total) by mouth at bedtime. 30 tablet 0   OLANZapine  (ZYPREXA ) 20 MG tablet Take 1 tablet (20 mg total) by mouth at bedtime. 30 tablet 0   OLANZapine  (ZYPREXA ) 20 MG tablet Take 1 tablet (20 mg total) by mouth at bedtime. 30 tablet 2   [DISCONTINUED] ARIPiprazole  (ABILIFY ) 15 MG tablet Take 1 tablet (15 mg total) by mouth daily. 30 tablet 1   [DISCONTINUED] buPROPion  (WELLBUTRIN  XL) 150 MG 24 hr tablet Take 1 tablet (150  mg total) by mouth daily. 30 tablet 0   [DISCONTINUED] sertraline  (ZOLOFT ) 50 MG tablet Take 1 tablet (50 mg total) by mouth daily. 30 tablet 0   No current facility-administered medications on file prior to visit.    Review of Systems  Constitutional:  Negative for appetite change, chills, fever and unexpected weight change.  Respiratory:  Negative for cough.   Cardiovascular:  Negative for chest pain and palpitations.  Gastrointestinal:  Negative for nausea and vomiting.      Objective:    BP 124/74   Pulse 62    Temp 97.6 F (36.4 C) (Oral)   Ht 5' 10 (1.778 m)   Wt 146 lb 6.4 oz (66.4 kg)   SpO2 98%   BMI 21.01 kg/m  BP Readings from Last 3 Encounters:  07/08/24 124/74  05/06/24 120/72  11/01/23 116/70   Wt Readings from Last 3 Encounters:  07/08/24 146 lb 6.4 oz (66.4 kg)  05/06/24 143 lb 12.8 oz (65.2 kg)  11/01/23 168 lb (76.2 kg)      07/08/2024   11:06 AM 05/06/2024    8:52 AM 11/01/2023    3:09 PM  Depression screen PHQ 2/9  Decreased Interest 1 1 0  Down, Depressed, Hopeless 1 0 0  PHQ - 2 Score 2 1 0  Altered sleeping 3 0 1  Tired, decreased energy 1 0 1  Change in appetite 0 1 0  Feeling bad or failure about yourself  1 0 1  Trouble concentrating 2 2 2   Moving slowly or fidgety/restless 0 0 0  Suicidal thoughts 0 0 0  PHQ-9 Score 9 4 5   Difficult doing work/chores Somewhat difficult Somewhat difficult Somewhat difficult     Physical Exam Vitals reviewed.  Constitutional:      Appearance: He is well-developed.  Cardiovascular:     Rate and Rhythm: Regular rhythm.     Heart sounds: Normal heart sounds.  Pulmonary:     Effort: Pulmonary effort is normal. No respiratory distress.     Breath sounds: Normal breath sounds. No wheezing, rhonchi or rales.  Skin:    General: Skin is warm and dry.  Neurological:     Mental Status: He is alert.  Psychiatric:        Speech: Speech normal.        Behavior: Behavior normal.

## 2024-07-08 NOTE — Patient Instructions (Signed)
 As discussed, I am here and rooting for you.  We discussed SMART meeting tomorrow ( printed out for you). I very much hope that you attend.  Please let me know if you need anything at all.

## 2024-07-08 NOTE — Assessment & Plan Note (Signed)
 He has gained weight. Pending vascular consult this month for incidental short aortomesenteric distance seen on CT chest a/p .

## 2024-07-21 ENCOUNTER — Ambulatory Visit (INDEPENDENT_AMBULATORY_CARE_PROVIDER_SITE_OTHER): Payer: Self-pay | Admitting: Vascular Surgery

## 2024-07-21 ENCOUNTER — Encounter (INDEPENDENT_AMBULATORY_CARE_PROVIDER_SITE_OTHER): Payer: Self-pay | Admitting: Vascular Surgery

## 2024-07-21 ENCOUNTER — Other Ambulatory Visit: Payer: Self-pay

## 2024-07-21 VITALS — BP 109/66 | HR 73 | Ht 70.0 in | Wt 149.0 lb

## 2024-07-21 DIAGNOSIS — R634 Abnormal weight loss: Secondary | ICD-10-CM | POA: Diagnosis not present

## 2024-07-21 DIAGNOSIS — K551 Chronic vascular disorders of intestine: Secondary | ICD-10-CM

## 2024-07-21 MED ORDER — FLUOXETINE HCL 20 MG PO CAPS
20.0000 mg | ORAL_CAPSULE | Freq: Every day | ORAL | 0 refills | Status: DC
  Filled 2024-07-21 – 2024-09-22 (×2): qty 30, 30d supply, fill #0

## 2024-07-21 NOTE — Assessment & Plan Note (Signed)
 Sounds like it may have been related more to not taking his medications for behavioral health issues and is better now.

## 2024-07-21 NOTE — Progress Notes (Signed)
 Patient ID: Christian Bond, male   DOB: 05-30-2001, 23 y.o.   MRN: [id number]  Chief Complaint  Patient presents with   New Patient (Initial Visit)    np. Consult. CT done on 05/06/24. Somewhat short Aortomesenteric distance seen CT chest a/p 04/2024 Eval for SMA syndrome in setting of weight loss.  Dineen Quant.     HPI Christian Bond is a 23 y.o. male.  I am asked to see the patient by Quant Dineen for evaluation of possible SMA syndrome in a patient with weight loss and CT scans showing a short aortomesenteric distance from June 2025.  I have independently reviewed the CT scan.  He does not have any significant atherosclerosis but there is a paucity of adipose and soft tissue in his retroperitoneum.  He had stopped taking some of his medications and had apparently stopped eating with that.  He did not have true postprandial abdominal pain or food fear.  He has begun eating well again and has resumed his medications.  He has gained some of the weight back.  He currently has no significant abdominal symptoms.     Past Medical History:  Diagnosis Date   ADHD (attention deficit hyperactivity disorder)    Allergy     Past Surgical History:  Procedure Laterality Date   PENILE ADHESIONS LYSIS  05/28/2022   TONSILLECTOMY AND ADENOIDECTOMY       Family History  Problem Relation Age of Onset   Colon polyps Mother    Stroke Maternal Uncle    Heart disease Maternal Grandfather    Hyperlipidemia Maternal Grandfather    Stroke Maternal Grandfather       Social History   Tobacco Use   Smoking status: Every Day    Current packs/day: 1.00    Types: Cigarettes    Passive exposure: Never   Smokeless tobacco: Never  Vaping Use   Vaping status: Some Days  Substance Use Topics   Alcohol use: Yes   Drug use: Yes    Types: Marijuana     Allergies  Allergen Reactions   Amoxicillin Rash    Erythema multiforma   Penicillins Other (See Comments) and Rash     Blisters. Other reaction(s): Other (See Comments) Erythema multiforma Blisters. Erythema multiforma Blisters.    Current Outpatient Medications  Medication Sig Dispense Refill   FLUoxetine  (PROZAC ) 10 MG capsule Take 1 capsule (10mg ) daily for 7 days then increase to 2 capsules (20mg ) daily 60 capsule 0   FLUoxetine  (PROZAC ) 20 MG capsule Take 1 capsule (20 mg total) by mouth daily. 30 capsule 0   nicotine  polacrilex (NICORETTE ) 4 MG gum Take 1 each (4 mg total) by mouth every 2 (two) hours as needed for smoking cessation. 110 each 0   OLANZapine  (ZYPREXA ) 2.5 MG tablet Take 1 tablet (2.5 mg total) by mouth daily as needed (anxiety, psychosis). 30 tablet 0   OLANZapine  (ZYPREXA ) 2.5 MG tablet Take 1 tablet (2.5 mg total) by mouth Three (3) times a day as needed (anxiety, psychosis). 90 tablet 0   OLANZapine  (ZYPREXA ) 20 MG tablet Take 1 tablet (20 mg total) by mouth every evening. 30 tablet 2   OLANZapine  (ZYPREXA ) 20 MG tablet Take 1 tablet (20 mg total) by mouth Nightly. 30 tablet 0   OLANZapine  (ZYPREXA ) 20 MG tablet Take 1 tablet (20 mg total) by mouth at bedtime. 30 tablet 0   OLANZapine  (ZYPREXA ) 20 MG tablet Take 1 tablet (20 mg total) by mouth at bedtime.  30 tablet 0   OLANZapine  (ZYPREXA ) 20 MG tablet Take 1 tablet (20 mg total) by mouth at bedtime. 30 tablet 2   nicotine  polacrilex (NICORETTE ) 4 MG gum Apply 1 each (4 mg total) to cheek every two (2) hours as needed for smoking cessation. (Patient not taking: Reported on 07/21/2024) 110 each 0   nicotine  polacrilex (NICORETTE ) 4 MG gum Take 1 each (4 mg total) by mouth/to cheek every 2 (two) hours as needed for smoking cessation. 110 each 0   No current facility-administered medications for this visit.      REVIEW OF SYSTEMS (Negative unless checked)  Constitutional: [x] Weight loss  [] Fever  [] Chills Cardiac: [] Chest pain   [] Chest pressure   [] Palpitations   [] Shortness of breath when laying flat   [] Shortness of breath at  rest   [] Shortness of breath with exertion. Vascular:  [] Pain in legs with walking   [] Pain in legs at rest   [] Pain in legs when laying flat   [] Claudication   [] Pain in feet when walking  [] Pain in feet at rest  [] Pain in feet when laying flat   [] History of DVT   [] Phlebitis   [] Swelling in legs   [] Varicose veins   [] Non-healing ulcers Pulmonary:   [] Uses home oxygen   [] Productive cough   [] Hemoptysis   [] Wheeze  [] COPD   [] Asthma Neurologic:  [] Dizziness  [] Blackouts   [] Seizures   [] History of stroke   [] History of TIA  [] Aphasia   [] Temporary blindness   [] Dysphagia   [] Weakness or numbness in arms   [] Weakness or numbness in legs Musculoskeletal:  [] Arthritis   [] Joint swelling   [] Joint pain   [] Low back pain Hematologic:  [] Easy bruising  [] Easy bleeding   [] Hypercoagulable state   [] Anemic  [] Hepatitis Gastrointestinal:  [] Blood in stool   [] Vomiting blood  [] Gastroesophageal reflux/heartburn   [] Abdominal pain Genitourinary:  [] Chronic kidney disease   [] Difficult urination  [] Frequent urination  [] Burning with urination   [] Hematuria Skin:  [] Rashes   [] Ulcers   [] Wounds Psychological:  [] History of anxiety   []  History of major depression.    Physical Exam BP 109/66   Pulse 73   Ht 5' 10 (1.778 m)   Wt 149 lb (67.6 kg)   BMI 21.38 kg/m  Gen:  WD/WN, NAD.  Thin, healthy appearing young man Head: [place]/AT, No temporalis wasting.  Ear/Nose/Throat: Hearing grossly intact, nares w/o erythema or drainage, oropharynx w/o Erythema/Exudate Eyes: Conjunctiva clear, sclera non-icteric  Neck: trachea midline.  No JVD.  Pulmonary:  Good air movement, respirations not labored, no use of accessory muscles  Cardiac: RRR, no JVD Vascular:  Vessel Right Left  Radial Palpable Palpable                                   Gastrointestinal:. No masses, surgical incisions, or scars.  Nontender Musculoskeletal: M/S 5/5 throughout.  Extremities without ischemic changes.  No deformity or  atrophy.  No edema. Neurologic: Sensation grossly intact in extremities.  Symmetrical.  Speech is fluent. Motor exam as listed above. Psychiatric: Judgment intact, Mood & affect appropriate for pt's clinical situation. Dermatologic: No rashes or ulcers noted.  No cellulitis or open wounds.    Radiology No results found.  Labs Recent Results (from the past 2160 hours)  CBC with Differential/Platelet     Status: None   Collection Time: 05/06/24  9:32 AM  Result Value  Ref Range   WBC 8.7 4.0 - 10.5 K/uL   RBC 4.49 4.22 - 5.81 Mil/uL   Hemoglobin 14.0 13.0 - 17.0 g/dL   HCT 59.5 60.9 - 47.9 %   MCV 90.1 78.0 - 100.0 fl   MCHC 34.6 30.0 - 36.0 g/dL   RDW 86.6 88.4 - 84.4 %   Platelets 395.0 150.0 - 400.0 K/uL   Neutrophils Relative % 69.6 43.0 - 77.0 %   Lymphocytes Relative 25.0 12.0 - 46.0 %   Monocytes Relative 4.3 3.0 - 12.0 %   Eosinophils Relative 0.8 0.0 - 5.0 %   Basophils Relative 0.3 0.0 - 3.0 %   Neutro Abs 6.0 1.4 - 7.7 K/uL   Lymphs Abs 2.2 0.7 - 4.0 K/uL   Monocytes Absolute 0.4 0.1 - 1.0 K/uL   Eosinophils Absolute 0.1 0.0 - 0.7 K/uL   Basophils Absolute 0.0 0.0 - 0.1 K/uL  Urinalysis, Routine w reflex microscopic     Status: Abnormal   Collection Time: 05/06/24  9:32 AM  Result Value Ref Range   Color, Urine YELLOW Yellow;Lt. Yellow;Straw;Dark Yellow;Amber;Green;Red;Brown   APPearance Sl Cloudy (A) Clear;Turbid;Slightly Cloudy;Cloudy   Specific Gravity, Urine 1.020 1.000 - 1.030   pH 6.0 5.0 - 8.0   Total Protein, Urine NEGATIVE Negative   Urine Glucose NEGATIVE Negative   Ketones, ur TRACE (A) Negative   Bilirubin Urine NEGATIVE Negative   Hgb urine dipstick SMALL (A) Negative   Urobilinogen, UA 0.2 0.0 - 1.0   Leukocytes,Ua NEGATIVE Negative   Nitrite NEGATIVE Negative   WBC, UA 0-2/hpf 0-2/hpf   RBC / HPF 0-2/hpf 0-2/hpf   Mucus, UA Presence of (A) None   Squamous Epithelial / HPF Rare(0-4/hpf) Rare(0-4/hpf)  Comp Met (CMET)     Status: Abnormal    Collection Time: 05/06/24  9:37 AM  Result Value Ref Range   Glucose, Bld 98 65 - 99 mg/dL    Comment: .            Fasting reference interval .    BUN 13 7 - 25 mg/dL   Creat 8.98 9.39 - 8.75 mg/dL   eGFR 892 > OR = 60 fO/fpw/8.26f7   BUN/Creatinine Ratio SEE NOTE: 6 - 22 (calc)    Comment:    Not Reported: BUN and Creatinine are within    reference range. .    Sodium 138 135 - 146 mmol/L   Potassium 4.0 3.5 - 5.3 mmol/L   Chloride 102 98 - 110 mmol/L   CO2 27 20 - 32 mmol/L   Calcium 10.1 8.6 - 10.3 mg/dL   Total Protein 7.5 6.1 - 8.1 g/dL   Albumin 5.4 (H) 3.6 - 5.1 g/dL   Globulin 2.1 1.9 - 3.7 g/dL (calc)   AG Ratio 2.6 (H) 1.0 - 2.5 (calc)   Total Bilirubin 0.9 0.2 - 1.2 mg/dL   Alkaline phosphatase (APISO) 53 36 - 130 U/L   AST 11 10 - 40 U/L   ALT 8 (L) 9 - 46 U/L  TSH     Status: None   Collection Time: 05/06/24  9:37 AM  Result Value Ref Range   TSH 1.58 0.40 - 4.50 mIU/L    Assessment/Plan:  Superior mesenteric artery syndrome [facility]) The patient based off of his CT scan from June 2025 which I reviewed whether he may have superior mesenteric artery syndrome.  He has begun eating and has gained weight and currently has no symptoms.  He is certainly very thin and  there could definitely be some degree of compression from the superior mesenteric artery if he continued to lose weight, but as long as he is eating well and gaining weight then no further therapy would be necessary.  That is actually one of the primary treatments is to place a feeding tube past the area of obstruction to feed the patient to increase the retroperitoneal soft tissue.  At this point, without symptoms I would not plan any other further workup or intervention and they will contact my office with any recurrent symptoms.  Weight loss, unintentional Sounds like it may have been related more to not taking his medications for behavioral health issues and is better now.      Christian Bond 07/21/2024,  11:03 AM   This note was created with Dragon medical transcription system.  Any errors from dictation are unintentional.

## 2024-07-21 NOTE — Assessment & Plan Note (Signed)
 The patient based off of his CT scan from June 2025 which I reviewed whether he may have superior mesenteric artery syndrome.  He has begun eating and has gained weight and currently has no symptoms.  He is certainly very thin and there could definitely be some degree of compression from the superior mesenteric artery if he continued to lose weight, but as long as he is eating well and gaining weight then no further therapy would be necessary.  That is actually one of the primary treatments is to place a feeding tube past the area of obstruction to feed the patient to increase the retroperitoneal soft tissue.  At this point, without symptoms I would not plan any other further workup or intervention and they will contact my office with any recurrent symptoms.

## 2024-07-28 ENCOUNTER — Other Ambulatory Visit: Payer: Self-pay

## 2024-07-28 MED ORDER — OLANZAPINE 15 MG PO TABS
15.0000 mg | ORAL_TABLET | Freq: Every day | ORAL | 0 refills | Status: DC
  Filled 2024-07-28 – 2024-09-22 (×2): qty 30, 30d supply, fill #0

## 2024-07-31 ENCOUNTER — Other Ambulatory Visit: Payer: Self-pay

## 2024-08-10 ENCOUNTER — Other Ambulatory Visit: Payer: Self-pay

## 2024-08-13 ENCOUNTER — Other Ambulatory Visit: Payer: Self-pay

## 2024-08-25 ENCOUNTER — Other Ambulatory Visit: Payer: Self-pay

## 2024-08-25 MED ORDER — FLUOXETINE HCL 20 MG PO CAPS
20.0000 mg | ORAL_CAPSULE | Freq: Every day | ORAL | 0 refills | Status: DC
  Filled 2024-08-25: qty 30, 30d supply, fill #0

## 2024-08-25 MED ORDER — VARENICLINE TARTRATE 0.5 MG PO TABS
0.5000 mg | ORAL_TABLET | Freq: Two times a day (BID) | ORAL | 0 refills | Status: DC
  Filled 2024-08-25: qty 60, 30d supply, fill #0

## 2024-08-25 MED ORDER — OLANZAPINE 15 MG PO TABS
15.0000 mg | ORAL_TABLET | Freq: Every day | ORAL | 0 refills | Status: DC
  Filled 2024-08-25: qty 30, 30d supply, fill #0

## 2024-09-10 ENCOUNTER — Other Ambulatory Visit: Payer: Self-pay

## 2024-09-10 MED ORDER — VARENICLINE TARTRATE 1 MG PO TABS
1.0000 mg | ORAL_TABLET | Freq: Two times a day (BID) | ORAL | 0 refills | Status: DC
  Filled 2024-09-10: qty 60, 30d supply, fill #0

## 2024-09-22 ENCOUNTER — Other Ambulatory Visit: Payer: Self-pay

## 2024-10-01 ENCOUNTER — Other Ambulatory Visit: Payer: Self-pay

## 2024-10-01 MED ORDER — FLUOXETINE HCL 20 MG PO CAPS
20.0000 mg | ORAL_CAPSULE | Freq: Every day | ORAL | 2 refills | Status: DC
  Filled 2024-10-01 – 2024-10-30 (×2): qty 30, 30d supply, fill #0
  Filled 2024-11-26: qty 30, 30d supply, fill #1

## 2024-10-01 MED ORDER — OLANZAPINE 15 MG PO TABS
15.0000 mg | ORAL_TABLET | Freq: Every day | ORAL | 2 refills | Status: AC
  Filled 2024-10-01 – 2024-10-30 (×2): qty 30, 30d supply, fill #0
  Filled 2024-11-26: qty 30, 30d supply, fill #1

## 2024-10-01 MED ORDER — VARENICLINE TARTRATE 1 MG PO TABS
1.0000 mg | ORAL_TABLET | Freq: Two times a day (BID) | ORAL | 2 refills | Status: AC
  Filled 2024-10-01: qty 60, 30d supply, fill #0
  Filled 2024-11-26: qty 56, 28d supply, fill #0

## 2024-10-06 ENCOUNTER — Encounter: Payer: Self-pay | Admitting: Nurse Practitioner

## 2024-10-06 ENCOUNTER — Ambulatory Visit: Admitting: Nurse Practitioner

## 2024-10-06 VITALS — BP 110/62 | HR 71 | Temp 98.0°F | Ht 70.0 in | Wt 160.0 lb

## 2024-10-06 DIAGNOSIS — F1090 Alcohol use, unspecified, uncomplicated: Secondary | ICD-10-CM | POA: Diagnosis not present

## 2024-10-06 DIAGNOSIS — R634 Abnormal weight loss: Secondary | ICD-10-CM

## 2024-10-06 DIAGNOSIS — F203 Undifferentiated schizophrenia: Secondary | ICD-10-CM

## 2024-10-06 NOTE — Assessment & Plan Note (Signed)
 Resolved. Patient has gain 11 pounds in the last approx 3 months. Appetite stable. We will continue to monitor.

## 2024-10-06 NOTE — Assessment & Plan Note (Signed)
 Consumes alcohol on weekends, 3-4 drinks per occasion, with no binge drinking. Counseled on alcohol use. Continue follow up with [facility].

## 2024-10-06 NOTE — Assessment & Plan Note (Signed)
 Managed with olanzapine  and fluoxetine . He reports motivational issues, possibly due to depression or ADD. Discuss symptoms with therapist and psychiatrist. Consider medication adjustment if worsening depression is suspected. Continue current medication regimen. Follow up with [facility] as scheduled.

## 2024-10-06 NOTE — Progress Notes (Signed)
 Leron Glance, NP-C Phone: [phone]  Christian Bond is a 23 y.o. male who presents today for transfer of care.   Discussed the use of AI scribe software for clinical note transcription with the patient, who gave verbal consent to proceed.  History of Present Illness   Christian Bond is a 23 year old male who presents for transfer of care.   He is currently under the care of a psychiatrist, whom he sees once a month, and a therapist, whom he sees weekly. His medication regimen includes 15 mg of olanzapine  taken at bedtime, 20 mg of Prozac , and 1 mg of Chantix. He has previously been on different doses of olanzapine  and is currently taking 15 mg.  He smokes a pack of cigarettes every two days and sometimes uses a vape instead. He also uses Nicorette  gum as needed. He consumes alcohol primarily on Fridays and Saturdays, averaging three to four drinks, and denies any other drug use.  He has gained approximately 10-11 pounds since August, which he views positively. His appetite is good.  He experiences difficulty with motivation, particularly in initiating tasks such as homework, although he does not have issues with concentration. He reports sleeping excessively. These symptoms have not been discussed with his therapist or psychiatrist yet.      Social History   Tobacco Use  Smoking Status Every Day   Current packs/day: 1.00   Types: Cigarettes   Passive exposure: Never  Smokeless Tobacco Never    Current Outpatient Medications on File Prior to Visit  Medication Sig Dispense Refill   FLUoxetine  (PROZAC ) 20 MG capsule Take 1 capsule (20 mg total) by mouth daily. 30 capsule 2   varenicline (APO-VARENICLINE) 1 MG tablet Take 1 tablet (1 mg total) by mouth 2 (two) times daily. 60 tablet 2   OLANZapine  (ZYPREXA ) 15 MG tablet Take 1 tablet (15 mg total) by mouth daily. 30 tablet 2   [DISCONTINUED] ARIPiprazole  (ABILIFY ) 15 MG tablet Take 1 tablet (15 mg total) by mouth daily. 30  tablet 1   [DISCONTINUED] buPROPion  (WELLBUTRIN  XL) 150 MG 24 hr tablet Take 1 tablet (150 mg total) by mouth daily. 30 tablet 0   [DISCONTINUED] sertraline  (ZOLOFT ) 50 MG tablet Take 1 tablet (50 mg total) by mouth daily. 30 tablet 0   No current facility-administered medications on file prior to visit.     ROS see history of present illness  Objective  Physical Exam Vitals:   10/06/24 1359  BP: 110/62  Pulse: 71  Temp: 98 F (36.7 C)  SpO2: 99%    BP Readings from Last 3 Encounters:  10/06/24 110/62  07/21/24 109/66  07/08/24 124/74   Wt Readings from Last 3 Encounters:  10/06/24 160 lb (72.6 kg)  07/21/24 149 lb (67.6 kg)  07/08/24 146 lb 6.4 oz (66.4 kg)    Physical Exam Constitutional:      General: He is not in acute distress.    Appearance: Normal appearance.  HENT:     Head: Normocephalic.  Cardiovascular:     Rate and Rhythm: Normal rate and regular rhythm.     Heart sounds: Normal heart sounds.  Pulmonary:     Effort: Pulmonary effort is normal.     Breath sounds: Normal breath sounds.  Skin:    General: Skin is warm and dry.  Neurological:     General: No focal deficit present.     Mental Status: He is alert.  Psychiatric:  Mood and Affect: Mood normal.        Behavior: Behavior normal.      Assessment/Plan: Please see individual problem list.  Undifferentiated schizophrenia (HCC) Assessment & Plan: Managed with olanzapine  and fluoxetine . He reports motivational issues, possibly due to depression or ADD. Discuss symptoms with therapist and psychiatrist. Consider medication adjustment if worsening depression is suspected. Continue current medication regimen. Follow up with [facility] as scheduled.    Alcohol use Assessment & Plan: Consumes alcohol on weekends, 3-4 drinks per occasion, with no binge drinking. Counseled on alcohol use. Continue follow up with [facility].    Weight loss,  unintentional Assessment & Plan: Resolved. Patient has gain 11 pounds in the last approx 3 months. Appetite stable. We will continue to monitor.       Return for as needed.   Leron Glance, NP-C [facility]

## 2024-10-08 ENCOUNTER — Ambulatory Visit: Admitting: Family

## 2024-10-29 ENCOUNTER — Other Ambulatory Visit: Payer: Self-pay

## 2024-10-29 MED ORDER — NICOTINE 14 MG/24HR TD PT24
14.0000 mg | MEDICATED_PATCH | Freq: Every day | TRANSDERMAL | 1 refills | Status: AC
  Filled 2024-10-29: qty 28, 28d supply, fill #0
  Filled 2024-11-26: qty 28, 28d supply, fill #1

## 2024-10-30 ENCOUNTER — Other Ambulatory Visit: Payer: Self-pay

## 2024-11-04 ENCOUNTER — Encounter: Payer: BC Managed Care – PPO | Admitting: Family Medicine

## 2024-11-27 ENCOUNTER — Other Ambulatory Visit: Payer: Self-pay

## 2024-11-27 ENCOUNTER — Other Ambulatory Visit (HOSPITAL_COMMUNITY): Payer: Self-pay
